# Patient Record
Sex: Female | Born: 1960 | Race: White | Hispanic: No | Marital: Single | Smoking: Never smoker
Health system: Southern US, Community
[De-identification: ages and names within clinical notes are randomized; demographics above are authoritative.]

## PROBLEM LIST (undated history)

## (undated) DIAGNOSIS — F329 Major depressive disorder, single episode, unspecified: Secondary | ICD-10-CM

## (undated) DIAGNOSIS — D689 Coagulation defect, unspecified: Secondary | ICD-10-CM

## (undated) DIAGNOSIS — H905 Unspecified sensorineural hearing loss: Secondary | ICD-10-CM

## (undated) DIAGNOSIS — R011 Cardiac murmur, unspecified: Secondary | ICD-10-CM

## (undated) DIAGNOSIS — M1990 Unspecified osteoarthritis, unspecified site: Secondary | ICD-10-CM

## (undated) DIAGNOSIS — K219 Gastro-esophageal reflux disease without esophagitis: Secondary | ICD-10-CM

## (undated) DIAGNOSIS — G4730 Sleep apnea, unspecified: Secondary | ICD-10-CM

## (undated) DIAGNOSIS — I341 Nonrheumatic mitral (valve) prolapse: Secondary | ICD-10-CM

## (undated) DIAGNOSIS — E611 Iron deficiency: Secondary | ICD-10-CM

## (undated) DIAGNOSIS — K259 Gastric ulcer, unspecified as acute or chronic, without hemorrhage or perforation: Secondary | ICD-10-CM

## (undated) DIAGNOSIS — N200 Calculus of kidney: Secondary | ICD-10-CM

## (undated) DIAGNOSIS — H269 Unspecified cataract: Secondary | ICD-10-CM

## (undated) DIAGNOSIS — F419 Anxiety disorder, unspecified: Secondary | ICD-10-CM

## (undated) DIAGNOSIS — F32A Depression, unspecified: Secondary | ICD-10-CM

## (undated) DIAGNOSIS — D649 Anemia, unspecified: Secondary | ICD-10-CM

## (undated) DIAGNOSIS — E785 Hyperlipidemia, unspecified: Secondary | ICD-10-CM

## (undated) DIAGNOSIS — T7840XA Allergy, unspecified, initial encounter: Secondary | ICD-10-CM

## (undated) DIAGNOSIS — I10 Essential (primary) hypertension: Secondary | ICD-10-CM

## (undated) HISTORY — DX: Major depressive disorder, single episode, unspecified: F32.9

## (undated) HISTORY — DX: Allergy, unspecified, initial encounter: T78.40XA

## (undated) HISTORY — DX: Essential (primary) hypertension: I10

## (undated) HISTORY — PX: POLYPECTOMY: SHX149

## (undated) HISTORY — DX: Sleep apnea, unspecified: G47.30

## (undated) HISTORY — DX: Calculus of kidney: N20.0

## (undated) HISTORY — DX: Cardiac murmur, unspecified: R01.1

## (undated) HISTORY — DX: Gastro-esophageal reflux disease without esophagitis: K21.9

## (undated) HISTORY — DX: Anxiety disorder, unspecified: F41.9

## (undated) HISTORY — DX: Unspecified cataract: H26.9

## (undated) HISTORY — DX: Gastric ulcer, unspecified as acute or chronic, without hemorrhage or perforation: K25.9

## (undated) HISTORY — PX: BREAST BIOPSY: SHX20

## (undated) HISTORY — DX: Depression, unspecified: F32.A

## (undated) HISTORY — DX: Nonrheumatic mitral (valve) prolapse: I34.1

## (undated) HISTORY — PX: CARDIAC CATHETERIZATION: SHX172

## (undated) HISTORY — DX: Iron deficiency: E61.1

## (undated) HISTORY — DX: Coagulation defect, unspecified: D68.9

## (undated) HISTORY — DX: Unspecified sensorineural hearing loss: H90.5

## (undated) HISTORY — PX: OVARIAN CYST REMOVAL: SHX89

## (undated) HISTORY — DX: Anemia, unspecified: D64.9

## (undated) HISTORY — DX: Hyperlipidemia, unspecified: E78.5

## (undated) HISTORY — PX: COLONOSCOPY: SHX174

## (undated) HISTORY — DX: Unspecified osteoarthritis, unspecified site: M19.90

---
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
## 1984-04-28 HISTORY — PX: APPENDECTOMY: SHX54

## 1998-02-21 ENCOUNTER — Other Ambulatory Visit: Admission: RE | Admit: 1998-02-21 | Discharge: 1998-02-21 | Payer: Self-pay | Admitting: *Deleted

## 1998-04-28 HISTORY — PX: ABDOMINAL HYSTERECTOMY: SHX81

## 2007-04-29 HISTORY — PX: UPPER GASTROINTESTINAL ENDOSCOPY: SHX188

## 2007-04-29 LAB — HM COLONOSCOPY: HM Colonoscopy: NORMAL

## 2009-04-28 HISTORY — PX: BREAST SURGERY: SHX581

## 2009-04-28 HISTORY — PX: CHOLECYSTECTOMY: SHX55

## 2010-12-17 ENCOUNTER — Encounter: Payer: Self-pay | Admitting: Family

## 2010-12-17 ENCOUNTER — Ambulatory Visit (INDEPENDENT_AMBULATORY_CARE_PROVIDER_SITE_OTHER): Payer: Medicare Other | Admitting: Family

## 2010-12-17 DIAGNOSIS — R197 Diarrhea, unspecified: Secondary | ICD-10-CM

## 2010-12-17 DIAGNOSIS — E119 Type 2 diabetes mellitus without complications: Secondary | ICD-10-CM

## 2010-12-17 DIAGNOSIS — K219 Gastro-esophageal reflux disease without esophagitis: Secondary | ICD-10-CM

## 2010-12-17 LAB — HEMOGLOBIN A1C: Hgb A1c MFr Bld: 6.1 % — ABNORMAL HIGH (ref <5.7)

## 2010-12-17 LAB — CBC WITH DIFFERENTIAL/PLATELET
Eosinophils Absolute: 0 10*3/uL (ref 0.0–0.7)
Eosinophils Relative: 1 % (ref 0–5)
Hemoglobin: 12.9 g/dL (ref 12.0–15.0)
Lymphocytes Relative: 46 % (ref 12–46)
Lymphs Abs: 3.1 10*3/uL (ref 0.7–4.0)
MCH: 28.5 pg (ref 26.0–34.0)
MCV: 86.1 fL (ref 78.0–100.0)
Monocytes Relative: 9 % (ref 3–12)
Neutrophils Relative %: 45 % (ref 43–77)
Platelets: 269 10*3/uL (ref 150–400)
RBC: 4.52 MIL/uL (ref 3.87–5.11)
WBC: 6.7 10*3/uL (ref 4.0–10.5)

## 2010-12-17 LAB — HEPATIC FUNCTION PANEL
Albumin: 4.5 g/dL (ref 3.5–5.2)
Bilirubin, Direct: 0.1 mg/dL (ref 0.0–0.3)
Total Bilirubin: 0.4 mg/dL (ref 0.3–1.2)

## 2010-12-17 LAB — LIPASE: Lipase: 24 U/L (ref 0–75)

## 2010-12-17 MED ORDER — FLUTICASONE PROPIONATE 50 MCG/ACT NA SUSP: 2.0000 | Freq: Every day | NASAL | Status: DC

## 2010-12-17 MED ORDER — OMEPRAZOLE MAGNESIUM 20 MG PO TBEC: 20.0000 mg | DELAYED_RELEASE_TABLET | Freq: Every day | ORAL | Status: DC

## 2010-12-17 MED ORDER — DIPHENOXYLATE-ATROPINE 2.5-0.025 MG PO TABS: 1.0000 | ORAL_TABLET | Freq: Four times a day (QID) | ORAL | Status: AC | PRN

## 2010-12-17 NOTE — Patient Instructions (Addendum)
Please go directly to the lab on the first floor today (take a left off of the elevator). Call if you develop worsening abdominal pain, nausea, vomitting or fever >101. Complete stool samples and return to the lab. Follow up in 1 month for a medicare wellness visit.

## 2010-12-17 NOTE — Progress Notes (Signed)
Subjective:    Patient ID: Molly May, female    DOB: 1961/01/29, 50 y.o.   MRN: [id number]  HPI Ms. Fellows is a 50 yr old hearing impaired female who presents today to establish care.  She is accompanied by a sign language interpreter.    Diabetes- She has been diabetic for 5 years  She reports that her fasting sugars are variable, generally low 100's, not over 200.  She does not check her sugar in the evening.  Last eye exam was February 2012 at [facility].    Depression-  She reports that she was treated with prozac from age 63 to 69.  She reports mild depression currently due to a lot of stress due to financial stress.  Her husband recently found a new job.  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 Mom had german measles during pregnancy.  She also reports heart murmur.   HTN- she is treated with lisinopril 5mg  only.  She denies history of HTN.  Hyperlipidemia- Denies myalgias on the pravastatin.    Reports that she is seeing an orthopedist for right shoulder pain and has had an injection.    Migraines-  Reports that this is stable.  Last migraine was 3 yrs ago.  Kidney stones- reports that she has had 3 episodes of problems with kidney stones-  Last episode was 5 yrs ago.    Ulcer-  This was at age 29.  None since.    Cough- Started about 1 month.  Cough is dry. Reports + post nasal drip. This is worse at night, has gaging, can't breath.  Throws up every night.    Diarrhea- off/on x 3 weeks.  On the days that she has diarrhea she has multiple loose stools.  She has been on Janumet since January.  She denies any antibiotic use in the last 1-2 months.  She has not had any travel.  GERD- reports that this is "bad."   Review of Systems  Constitutional: Negative for unexpected weight change.  HENT: Positive for hearing loss and postnasal drip.   Eyes:       Reports + cataract in the right eye  Respiratory: Positive for cough.   Cardiovascular: Negative for leg swelling.   Gastrointestinal: Positive for abdominal pain.       Abd pain due to reflux  Genitourinary:       S/p hysterectomy- oophorectomy  Musculoskeletal:       See HPI  Neurological: Negative for headaches.  Hematological:       + cervical LAD- uncomfortable  Psychiatric/Behavioral:       Mild depression- declines medication at this time.    Past Medical History  Diagnosis Date  . Depression   . Diabetes mellitus   . GERD (gastroesophageal reflux disease)   . Hypertension   . Heart murmur   . Hyperlipidemia   . Stomach ulcer 34yrs of age  . Kidney stone   . Migraine     History   Social History  . Marital Status: Single    Spouse Name: N/A    Number of Children: N/A  . Years of Education: N/A   Occupational History  . Not on file.   Social History Main Topics  . Smoking status: Never Smoker   . Smokeless tobacco: Never Used  . Alcohol Use: No  . Drug Use: No  . Sexually Active: Not on file   Other Topics Concern  . Not on file   Social History  Narrative   Regular exercise:  Yes. 3-5 x weeklyCaffeine Use:  NoMarried- lives with husband, 2 children (age 65 and 26).Disabled- due to deafness and overall work. Couldn't keep up.  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    Past Surgical History  Procedure Date  . Cholecystectomy 2011  . Breast surgery 2011    biposy--benign  . Appendectomy 1986  . Tonsillectomy and adenoidectomy 1973  . Abdominal hysterectomy 2000  . Ovarian cyst removal 1986, 1988  . Upper gastrointestinal endoscopy 2009    Gets them every 3 years    Family History  Problem Relation Age of Onset  . Kidney disease Mother   . Multiple sclerosis Mother   . Other Mother     polio  . Cancer Father     colon  . Hyperlipidemia Father   . Heart disease Father   . Hypertension Father   . Diabetes Maternal Aunt   . Heart disease Maternal Grandmother   . Hypertension Maternal  Grandmother   . Diabetes Maternal Grandmother   . Heart disease Maternal Grandfather   . Hypertension Maternal Grandfather     Allergies  Allergen Reactions  . Aspirin Other (See Comments)    Stomach pains  . Codeine Other (See Comments)    Nosebleed  . Erythromycin Nausea And Vomiting  . Penicillins Hives  . Vaseline (Petrolatum) Itching    No current outpatient prescriptions on file prior to visit.    BP 140/78  Pulse 84  Temp(Src) 97.5 F (36.4 C) (Oral)  Resp 16  Ht 5' 2.5" (1.588 m)  Wt 106 lb (48.081 kg)  BMI 19.08 kg/m2  LMP 04/28/1998        Objective:   Physical Exam  Constitutional: She is oriented to person, place, and time. She appears well-developed and well-nourished.  HENT:  Mouth/Throat: Uvula is midline, oropharynx is clear and moist and mucous membranes are normal.       Bilateral tm's occluded by cerumen  Neck: Normal range of motion. Neck supple. No thyromegaly present.  Cardiovascular: Normal rate and regular rhythm.   No murmur heard. Pulmonary/Chest: Effort normal and breath sounds normal. No respiratory distress. She has no wheezes. She has no rales. She exhibits no tenderness.  Abdominal: Soft. Bowel sounds are normal. She exhibits no distension and no mass. There is no tenderness. There is no rebound.  Musculoskeletal: Normal range of motion. She exhibits no edema.  Neurological: She is alert and oriented to person, place, and time.  Skin: Skin is warm and dry.  Psychiatric: She has a normal mood and affect. Her behavior is normal. Judgment and thought content normal.          Assessment & Plan:

## 2010-12-18 DIAGNOSIS — R197 Diarrhea, unspecified: Secondary | ICD-10-CM | POA: Insufficient documentation

## 2010-12-18 DIAGNOSIS — K219 Gastro-esophageal reflux disease without esophagitis: Secondary | ICD-10-CM | POA: Insufficient documentation

## 2010-12-18 DIAGNOSIS — E119 Type 2 diabetes mellitus without complications: Secondary | ICD-10-CM | POA: Insufficient documentation

## 2010-12-18 LAB — MICROALBUMIN / CREATININE URINE RATIO
Creatinine, Urine: 205.9 mg/dL
Microalb Creat Ratio: 3.4 mg/g (ref 0.0–30.0)

## 2010-12-18 LAB — BASIC METABOLIC PANEL WITHOUT GFR
BUN: 14 mg/dL (ref 6–23)
CO2: 30 meq/L (ref 19–32)
Calcium: 9.8 mg/dL (ref 8.4–10.5)
Chloride: 101 meq/L (ref 96–112)
Creat: 0.6 mg/dL (ref 0.50–1.10)
GFR, Est African American: >60 mL/min
GFR, Est Non African American: >60 mL/min
Glucose, Bld: 133 mg/dL — ABNORMAL HIGH (ref 70–99)
Potassium: 4.1 meq/L (ref 3.5–5.3)
Sodium: 138 meq/L (ref 135–145)

## 2010-12-18 NOTE — Assessment & Plan Note (Signed)
Deteriorated.  Recommended that she start Prilosec OTC.  This may be contributing to her c/o cough as well.

## 2010-12-18 NOTE — Assessment & Plan Note (Addendum)
Clinically seems well controlled, will check A1C, urine microalbumin, bmet.

## 2010-12-18 NOTE — Assessment & Plan Note (Signed)
Present x 3 weeks, afebrile.  Benign abd exam. Check stool studies as below.

## 2010-12-19 LAB — OVA AND PARASITE SCREEN: OP: NONE SEEN

## 2010-12-22 LAB — STOOL CULTURE

## 2010-12-23 ENCOUNTER — Encounter: Payer: Self-pay | Admitting: Family

## 2011-01-03 ENCOUNTER — Telehealth: Payer: Self-pay | Admitting: Family

## 2011-01-03 MED ORDER — ESTRADIOL 1 MG PO TABS: 1.0000 mg | ORAL_TABLET | Freq: Every day | ORAL | Status: DC

## 2011-01-03 MED ORDER — PRAVASTATIN SODIUM 20 MG PO TABS: 20.0000 mg | ORAL_TABLET | Freq: Every day | ORAL | Status: DC

## 2011-01-03 NOTE — Telephone Encounter (Signed)
OK to give 1 mont supply estradiol with 2 refills.

## 2011-01-03 NOTE — Telephone Encounter (Signed)
Pt has f/u 01/17/11. Refill sent for pravastatin 20mg  1 daily #30 x no refills. Please advise if ok to refill Estradiol and number of refills?

## 2011-01-03 NOTE — Telephone Encounter (Signed)
Pt called  Needs 2 meds sent to [facility] on [address]   HP  , pt states she did not get Estradiol 1mg  and Pravastatin  20mg  when she was seen as New Patient

## 2011-01-03 NOTE — Telephone Encounter (Signed)
Refills sent to pharmacy as stated below. Pt notified.

## 2011-01-17 ENCOUNTER — Ambulatory Visit (INDEPENDENT_AMBULATORY_CARE_PROVIDER_SITE_OTHER): Payer: Medicare Other | Admitting: Family

## 2011-01-17 ENCOUNTER — Encounter: Payer: Self-pay | Admitting: Family

## 2011-01-17 VITALS — BP 100/80 | HR 66 | Temp 97.9°F | Resp 16 | Ht 62.5 in | Wt 105.1 lb

## 2011-01-17 DIAGNOSIS — Z0000 Encounter for general adult medical examination without abnormal findings: Secondary | ICD-10-CM | POA: Insufficient documentation

## 2011-01-17 DIAGNOSIS — E785 Hyperlipidemia, unspecified: Secondary | ICD-10-CM

## 2011-01-17 DIAGNOSIS — R202 Paresthesia of skin: Secondary | ICD-10-CM

## 2011-01-17 DIAGNOSIS — R209 Unspecified disturbances of skin sensation: Secondary | ICD-10-CM

## 2011-01-17 DIAGNOSIS — K219 Gastro-esophageal reflux disease without esophagitis: Secondary | ICD-10-CM

## 2011-01-17 DIAGNOSIS — E1142 Type 2 diabetes mellitus with diabetic polyneuropathy: Secondary | ICD-10-CM | POA: Insufficient documentation

## 2011-01-17 MED ORDER — RANITIDINE HCL 150 MG PO CAPS: 150.0000 mg | ORAL_CAPSULE | Freq: Two times a day (BID) | ORAL | Status: DC

## 2011-01-17 MED ORDER — LISINOPRIL 5 MG PO TABS: 5.0000 mg | ORAL_TABLET | Freq: Every day | ORAL | Status: DC

## 2011-01-17 MED ORDER — SITAGLIPTIN PHOS-METFORMIN HCL 50-1000 MG PO TABS: 1.0000 | ORAL_TABLET | Freq: Two times a day (BID) | ORAL | Status: DC

## 2011-01-17 NOTE — Assessment & Plan Note (Signed)
Will refer to GYN at pt's request.

## 2011-01-17 NOTE — Progress Notes (Signed)
Subjective:    Patient ID: Molly May, female    DOB: [birth year], 50 y.o.   MRN: [id number]  HPI  Preventative- She is requesting referral to GYN for pap smear. She reports that she walks regularly.  Diet is healthy.  She has started diabetic education.  Last mammogram January 2012. Performed at [facility]. Last tetanus shot was 2004.  Declines flu shot- she reports that she had a bad reaction.   Subjective:   Patient here for Medicare annual wellness visit and management of other chronic and acute problems.  GERD-  She reports that prilosec gave her migraines.   She wants referral to GI- Dad had colon cancer which was diagnosed in his 31's.  She notes that her reflux bothers her the most when she goes to bed.    Feet burning- started recently. Intermittent in nature, mild.   DM2-A1C last visit was stable. She continues Janumet.     Risk factors:  Increased risk for CAD given history of DM2.  Roster of Physicians Providing Medical Care to Patient: None   Activities of Daily Living  In your present state of health, do you have any difficulty performing the following activities? Preparing food and eating?: No  Bathing yourself: No  Getting dressed: No  Using the toilet:No  Moving around from place to place: No  In the past year have you fallen or had a near fall?:No    Home Safety: Has smoke detector and wears seat belts. No firearms. No excess sun exposure.  Diet and Exercise  Current exercise habits: walking Dietary issues discussed: healthy diet   Depression Screen  (Note: if answer to either of the following is "Yes", then a more complete depression screening is indicated)  Q1: Over the past two weeks, have you felt down, depressed or hopeless?no  Q2: Over the past two weeks, have you felt little interest or pleasure in doing things? no   The following portions of the patient's history were reviewed and updated as appropriate: allergies, current medications,  past family history, past medical history, past social history, past surgical history and problem list.    Objective:   Vision:  Not tested, grossly intact Hearing: deaf Body mass index: see nursing.  Cognitive Impairment Assessment: cognition, memory and judgment appear normal.   Assessment:   Medicare wellness utd on preventive parameters- she is requesting referral to GYN for Pap.    Encourage patient to continue exercise and healthy diet.  Plan:    During the course of the visit the patient was educated and counseled about appropriate screening and preventive services including:        Nutrition counseling- she will continue her diabetic counseling.   Vaccines / LABS Declines flu shot, up to date on tetanus.  Patient Instructions (the written plan) was given to the patient.       Review of Systems  Constitutional: Negative for unexpected weight change.  HENT: Positive for hearing loss.   Eyes:       Reports cataract in the right eye,  Left eye she has  A "dot"  She is following with eye doctor who recommends surgery at age 57.  Respiratory: Negative for shortness of breath.   Cardiovascular: Negative for chest pain.  Gastrointestinal: Negative for diarrhea.       Notes occasional nausea.  Genitourinary: Negative for dysuria.  Musculoskeletal: Negative for myalgias and arthralgias.  Skin: Negative for rash.       denies concerning skin lesions.  Neurological:       Headaches while on prilosec, now resolved  Hematological: Negative for adenopathy.  Psychiatric/Behavioral:       Denies depression or anxiety   Past Medical History  Diagnosis Date  . Depression   . Diabetes mellitus   . GERD (gastroesophageal reflux disease)   . Hypertension   . Heart murmur   . Hyperlipidemia   . Stomach ulcer 89yrs of age  . Kidney stone   . Migraine     History   Social History  . Marital Status: Single    Spouse Name: N/A    Number of Children: N/A  . Years of  Education: N/A   Occupational History  . Not on file.   Social History Main Topics  . Smoking status: Never Smoker   . Smokeless tobacco: Never Used  . Alcohol Use: No  . Drug Use: No  . Sexually Active: Not on file   Other Topics Concern  . Not on file   Social History Narrative   Regular exercise:  Yes. 3-5 x weeklyCaffeine Use:  NoMarried- lives with husband, 2 children (age 69 and 74).Disabled- due to deafness and overall work. Couldn't keep up.  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    Past Surgical History  Procedure Date  . Cholecystectomy 2011  . Breast surgery 2011    biposy--benign  . Appendectomy 1986  . Tonsillectomy and adenoidectomy 1973  . Abdominal hysterectomy 2000  . Ovarian cyst removal 1986, 1988  . Upper gastrointestinal endoscopy 2009    Gets them every 3 years    Family History  Problem Relation Age of Onset  . Kidney disease Mother   . Multiple sclerosis Mother   . Other Mother     polio  . Cancer Father     colon  . Hyperlipidemia Father   . Heart disease Father   . Hypertension Father   . Diabetes Maternal Aunt   . Heart disease Maternal Grandmother   . Hypertension Maternal Grandmother   . Diabetes Maternal Grandmother   . Heart disease Maternal Grandfather   . Hypertension Maternal Grandfather     Allergies  Allergen Reactions  . Aspirin Other (See Comments)    Stomach pains  . Codeine Other (See Comments)    Nosebleed  . Erythromycin Nausea And Vomiting  . Penicillins Hives  . Prilosec (Omeprazole Magnesium)     migraine  . Vaseline (Petrolatum) Itching  . Vicodin (Hydrocodone-Acetaminophen)     twitching    Current Outpatient Prescriptions on File Prior to Visit  Medication Sig Dispense Refill  . estradiol (ESTRACE) 1 MG tablet Take 1 tablet (1 mg total) by mouth daily.  30 tablet  2  . fluticasone (FLONASE) 50 MCG/ACT nasal spray Place 2 sprays  into the nose daily.  16 g  2  . glucose blood (ONE TOUCH ULTRA TEST) test strip Use to check blood sugar every morning.       . pravastatin (PRAVACHOL) 20 MG tablet Take 1 tablet (20 mg total) by mouth at bedtime.  30 tablet  1    BP 100/80  Pulse 66  Temp(Src) 97.9 F (36.6 C) (Oral)  Resp 16  Ht 5' 2.5" (1.588 m)  Wt 105 lb 1.9 oz (47.682 kg)  BMI 18.92 kg/m2  LMP 04/28/1998       Objective:   Physical Exam  Constitutional: She is oriented to person, place,  and time. She appears well-developed and well-nourished. No distress.  HENT:  Head: Normocephalic and atraumatic.  Mouth/Throat: No oropharyngeal exudate.       Bilateral cerumen impaction. Declines removal.   Eyes: Conjunctivae are normal. Pupils are equal, round, and reactive to light.  Cardiovascular: Normal rate and regular rhythm.   No murmur heard. Pulmonary/Chest: Effort normal and breath sounds normal. No respiratory distress. She has no wheezes. She has no rales. She exhibits no tenderness.  Abdominal: Soft. Bowel sounds are normal.  Genitourinary:       Deferred to GYN  Musculoskeletal: She exhibits no edema.  Neurological: She is alert and oriented to person, place, and time.  Skin: Skin is warm and dry.  Psychiatric: She has a normal mood and affect. Her behavior is normal. Judgment and thought content normal.          Assessment & Plan:

## 2011-01-17 NOTE — Assessment & Plan Note (Signed)
Suspect early neuropathy.  We discussed trying medication, but she declines at this time, will let me know if symptoms worsen.

## 2011-01-17 NOTE — Assessment & Plan Note (Signed)
Did not tolerate prilosec due to headache.  Will switch to zantac and refer to GI for further evaluation.

## 2011-01-17 NOTE — Patient Instructions (Signed)
Please return fasting to the lab on the first floor to have your cholesterol drawn. Follow up in 3 months.   You will be contacted about your referral to GYN and Gastroenterology.

## 2011-01-23 ENCOUNTER — Telehealth: Payer: Self-pay | Admitting: *Deleted

## 2011-01-23 NOTE — Telephone Encounter (Signed)
Received records from [facility] in [place] and forwarded to Provider for review.

## 2011-02-08 LAB — LIPID PANEL
Cholesterol: 136 mg/dL (ref 0–200)
Triglycerides: 80 mg/dL (ref <150)

## 2011-02-09 ENCOUNTER — Encounter: Payer: Self-pay | Admitting: Family

## 2011-02-19 ENCOUNTER — Encounter: Payer: Self-pay | Admitting: Family

## 2011-02-19 DIAGNOSIS — G43909 Migraine, unspecified, not intractable, without status migrainosus: Secondary | ICD-10-CM | POA: Insufficient documentation

## 2011-02-19 DIAGNOSIS — K589 Irritable bowel syndrome without diarrhea: Secondary | ICD-10-CM | POA: Insufficient documentation

## 2011-02-19 DIAGNOSIS — G4730 Sleep apnea, unspecified: Secondary | ICD-10-CM | POA: Insufficient documentation

## 2011-02-19 DIAGNOSIS — I341 Nonrheumatic mitral (valve) prolapse: Secondary | ICD-10-CM

## 2011-02-19 DIAGNOSIS — H905 Unspecified sensorineural hearing loss: Secondary | ICD-10-CM

## 2011-02-19 DIAGNOSIS — F419 Anxiety disorder, unspecified: Secondary | ICD-10-CM

## 2011-02-19 HISTORY — DX: Nonrheumatic mitral (valve) prolapse: I34.1

## 2011-02-19 HISTORY — DX: Unspecified sensorineural hearing loss: H90.5

## 2011-02-19 HISTORY — DX: Anxiety disorder, unspecified: F41.9

## 2011-03-06 ENCOUNTER — Other Ambulatory Visit: Payer: Self-pay | Admitting: Family

## 2011-04-16 ENCOUNTER — Ambulatory Visit: Payer: Medicare Other | Admitting: Family

## 2011-05-02 ENCOUNTER — Encounter: Payer: Self-pay | Admitting: Family

## 2011-05-02 ENCOUNTER — Ambulatory Visit (INDEPENDENT_AMBULATORY_CARE_PROVIDER_SITE_OTHER): Payer: Medicare Other | Admitting: Family

## 2011-05-02 VITALS — BP 140/78 | HR 96 | Temp 97.4°F | Resp 18 | Ht 60.25 in | Wt 103.0 lb

## 2011-05-02 DIAGNOSIS — E119 Type 2 diabetes mellitus without complications: Secondary | ICD-10-CM

## 2011-05-02 DIAGNOSIS — K219 Gastro-esophageal reflux disease without esophagitis: Secondary | ICD-10-CM

## 2011-05-02 DIAGNOSIS — E785 Hyperlipidemia, unspecified: Secondary | ICD-10-CM | POA: Insufficient documentation

## 2011-05-02 LAB — HEMOGLOBIN A1C: Hgb A1c MFr Bld: 5.8 % — ABNORMAL HIGH (ref <5.7)

## 2011-05-02 NOTE — Patient Instructions (Signed)
Please complete your blood work prior to leaving.  Follow up in 3 months.  Happy New Year!

## 2011-05-02 NOTE — Assessment & Plan Note (Signed)
Lipids at goal in October.  Will plan to continue statin at current dose.

## 2011-05-02 NOTE — Progress Notes (Signed)
Subjective:    Patient ID: Molly May, female    DOB: 1961-03-17, 51 y.o.   MRN: [id number]  HPI  Ms.  May is a 51 yr old female who presents today for follow up.  1) Hyperlipidemia- She is maintained on pravastatin. Denies myalgias.  2) GERD- was diagnosed with H. Pylori. She completed metronidazole/clarithromycin yesterday. She is maintained on protonix. Relux symptom are improved, coughing is resolved.  She is followed at [facility].   3) Paresthesia of foot-  She reports that this is resolved. She reports that her son dropped an ornament on the floor and it struck her foot, but no residual pain.   4) DM2- She continues Janumet. She reports that her sugar has been in the 200's.      Review of Systems    see HPI  Past Medical History  Diagnosis Date  . Depression   . Diabetes mellitus   . GERD (gastroesophageal reflux disease)   . Hypertension   . Heart murmur   . Hyperlipidemia   . Stomach ulcer 89yrs of age  . Kidney stone   . Migraine   . 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 Anxiety disorder 02/19/2011  . Mitral valve prolapse 02/19/2011  . Sleep apnea 02/19/2011    History   Social History  . Marital Status: Single    Spouse Name: N/A    Number of Children: N/A  . Years of Education: N/A   Occupational History  . Not on file.   Social History Main Topics  . Smoking status: Never Smoker   . Smokeless tobacco: Never Used  . Alcohol Use: No  . Drug Use: No  . Sexually Active: Not on file   Other Topics Concern  . Not on file   Social History Narrative   Regular exercise:  Yes. 3-5 x weeklyCaffeine Use:  NoMarried- lives with husband, 2 children (age 63 and 21).Disabled- due to deafness and overall work. Couldn't keep up.  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    Past Surgical History  Procedure Date  . Cholecystectomy 2011  . Breast surgery 2011   biposy--benign  . Appendectomy 1986  . Tonsillectomy and adenoidectomy 1973  . Abdominal hysterectomy 2000  . Ovarian cyst removal 1986, 1988  . Upper gastrointestinal endoscopy 2009    Gets them every 3 years    Family History  Problem Relation Age of Onset  . Kidney disease Mother   . Multiple sclerosis Mother   . Other Mother     polio  . Cancer Father     colon  . Hyperlipidemia Father   . Heart disease Father   . Hypertension Father   . Diabetes Maternal Aunt   . Heart disease Maternal Grandmother   . Hypertension Maternal Grandmother   . Diabetes Maternal Grandmother   . Heart disease Maternal Grandfather   . Hypertension Maternal Grandfather     Allergies  Allergen Reactions  . Aspirin Other (See Comments)    Stomach pains  . Codeine Other (See Comments)    Nosebleed  . Erythromycin Nausea And Vomiting  . Penicillins Hives  . Prilosec (Omeprazole Magnesium)     migraine  . Vaseline (Petrolatum) Itching  . Vicodin (Hydrocodone-Acetaminophen)     twitching    Current Outpatient Prescriptions on File Prior to Visit  Medication Sig Dispense Refill  . Calcium Carbonate-Vitamin D (CALTRATE 600+D) 600-400 MG-UNIT per tablet Take 1  tablet by mouth daily.        Marland Kitchen estradiol (ESTRACE) 1 MG tablet Take 1 tablet (1 mg total) by mouth daily.  30 tablet  2  . fluticasone (FLONASE) 50 MCG/ACT nasal spray Place 2 sprays into the nose daily.  16 g  2  . glucose blood (ONE TOUCH ULTRA TEST) test strip Use to check blood sugar every morning.       Marland Kitchen lisinopril (PRINIVIL,ZESTRIL) 5 MG tablet Take 1 tablet (5 mg total) by mouth daily. For kidney protection.  30 tablet  3  . pravastatin (PRAVACHOL) 20 MG tablet TAKE ONE TABLET BY MOUTH AT BEDTIME  30 tablet  1  . sitaGLIPtan-metformin (JANUMET) 50-1000 MG per tablet Take 1 tablet by mouth 2 (two) times daily with a meal.  60 tablet  3    BP 140/78  Pulse 96  Temp(Src) 97.4 F (36.3 C) (Oral)  Resp 18  Ht 5' 0.25" (1.53 m)   Wt 103 lb (46.72 kg)  BMI 19.95 kg/m2  LMP 04/28/1998    Objective:   Physical Exam  Constitutional: She is oriented to person, place, and time. She appears well-developed and well-nourished. No distress.  HENT:  Head: Normocephalic and atraumatic.  Neck: Normal range of motion. Neck supple.  Cardiovascular: Normal rate and regular rhythm.   No murmur heard. Pulmonary/Chest: Effort normal and breath sounds normal. No respiratory distress. She has no wheezes. She has no rales. She exhibits no tenderness.  Musculoskeletal: She exhibits no edema.  Lymphadenopathy:    She has no cervical adenopathy.  Neurological: She is alert and oriented to person, place, and time.  Skin: Skin is warm and dry.  Psychiatric: She has a normal mood and affect. Her behavior is normal. Judgment and thought content normal.          Assessment & Plan:

## 2011-05-02 NOTE — Assessment & Plan Note (Addendum)
Obtain A1C. Per report, sugar has been running high. A1C was 6.1 previously.  Continue janumet.

## 2011-05-02 NOTE — Assessment & Plan Note (Signed)
S/p H. Pylori treatment. Clinically improved on protonix, continue same.

## 2011-05-04 ENCOUNTER — Encounter: Payer: Self-pay | Admitting: Family

## 2011-05-05 ENCOUNTER — Other Ambulatory Visit: Payer: Self-pay | Admitting: Family

## 2011-05-21 ENCOUNTER — Other Ambulatory Visit: Payer: Self-pay | Admitting: Family

## 2011-06-02 ENCOUNTER — Encounter: Payer: Self-pay | Admitting: Family

## 2011-06-02 ENCOUNTER — Ambulatory Visit (INDEPENDENT_AMBULATORY_CARE_PROVIDER_SITE_OTHER): Payer: Medicare Other | Admitting: Family

## 2011-06-02 ENCOUNTER — Ambulatory Visit: Payer: Medicare Other | Admitting: Family

## 2011-06-02 VITALS — BP 110/80 | HR 121 | Temp 97.6°F | Resp 16

## 2011-06-02 DIAGNOSIS — J020 Streptococcal pharyngitis: Secondary | ICD-10-CM | POA: Insufficient documentation

## 2011-06-02 MED ORDER — BENZONATATE 100 MG PO CAPS: 100.0000 mg | ORAL_CAPSULE | Freq: Three times a day (TID) | ORAL | Status: AC | PRN

## 2011-06-02 MED ORDER — CEFUROXIME AXETIL 500 MG PO TABS: 500.0000 mg | ORAL_TABLET | Freq: Two times a day (BID) | ORAL | Status: AC

## 2011-06-02 NOTE — Progress Notes (Signed)
Subjective:    Patient ID: Molly May, female    DOB: 1961-03-02, 51 y.o.   MRN: [id number]  HPI Ms.  May is a 51 yr old female who presents today with chief complaint of sore throat.  Symptoms started thursday night. Has had hot/cold chills.  +cough, production yellow/green.     Review of Systems See HPI  Past Medical History  Diagnosis Date  . Depression   . Diabetes mellitus   . GERD (gastroesophageal reflux disease)   . Hypertension   . Heart murmur   . Hyperlipidemia   . Stomach ulcer 62yrs of age  . Kidney stone   . Migraine   . 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 Anxiety disorder 02/19/2011  . Mitral valve prolapse 02/19/2011  . Sleep apnea 02/19/2011    History   Social History  . Marital Status: Single    Spouse Name: N/A    Number of Children: N/A  . Years of Education: N/A   Occupational History  . Not on file.   Social History Main Topics  . Smoking status: Never Smoker   . Smokeless tobacco: Never Used  . Alcohol Use: No  . Drug Use: No  . Sexually Active: Not on file   Other Topics Concern  . Not on file   Social History Narrative   Regular exercise:  Yes. 3-5 x weeklyCaffeine Use:  NoMarried- lives with husband, 2 children (age 42 and 71).Disabled- due to deafness and overall work. Couldn't keep up.  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    Past Surgical History  Procedure Date  . Cholecystectomy 2011  . Breast surgery 2011    biposy--benign  . Appendectomy 1986  . Tonsillectomy and adenoidectomy 1973  . Abdominal hysterectomy 2000  . Ovarian cyst removal 1986, 1988  . Upper gastrointestinal endoscopy 2009    Gets them every 3 years    Family History  Problem Relation Age of Onset  . Kidney disease Mother   . Multiple sclerosis Mother   . Other Mother     polio  . Cancer Father     colon  . Hyperlipidemia Father   . Heart disease Father   .  Hypertension Father   . Diabetes Maternal Aunt   . Heart disease Maternal Grandmother   . Hypertension Maternal Grandmother   . Diabetes Maternal Grandmother   . Heart disease Maternal Grandfather   . Hypertension Maternal Grandfather     Allergies  Allergen Reactions  . Aspirin Other (See Comments)    Stomach pains  . Codeine Other (See Comments)    Nosebleed  . Erythromycin Nausea And Vomiting  . Fluticasone Propionate (Nasal)     Stomach upset  . Penicillins Hives  . Prilosec (Omeprazole Magnesium)     migraine  . Vaseline (Petrolatum) Itching  . Vicodin (Hydrocodone-Acetaminophen)     twitching    Current Outpatient Prescriptions on File Prior to Visit  Medication Sig Dispense Refill  . Calcium Carbonate-Vitamin D (CALTRATE 600+D) 600-400 MG-UNIT per tablet Take 1 tablet by mouth daily.        Marland Kitchen estradiol (ESTRACE) 1 MG tablet Take 1 tablet (1 mg total) by mouth daily.  30 tablet  2  . glucose blood (ONE TOUCH ULTRA TEST) test strip Use to check blood sugar every morning.       Marland Kitchen JANUMET 50-1000 MG per tablet TAKE ONE TABLET BY MOUTH TWICE DAILY  WITH MEALS  60 each  3  . lisinopril (PRINIVIL,ZESTRIL) 5 MG tablet Take 1 tablet (5 mg total) by mouth daily. For kidney protection.  30 tablet  3  . pantoprazole (PROTONIX) 40 MG tablet Take 40 mg by mouth daily.        . pravastatin (PRAVACHOL) 20 MG tablet TAKE ONE TABLET BY MOUTH AT BEDTIME  30 tablet  3    BP 110/80  Pulse 121  Temp(Src) 97.6 F (36.4 C) (Oral)  Resp 16  SpO2 99%  LMP 04/28/1998       Objective:   Physical Exam  Constitutional: She appears well-developed and well-nourished. No distress.  HENT:  Head: Normocephalic and atraumatic.  Mouth/Throat: Posterior oropharyngeal erythema present. No oropharyngeal exudate, posterior oropharyngeal edema or tonsillar abscesses.       Bilateral TM's are occluded by cerumen.   Cardiovascular: Normal rate and regular rhythm.   No murmur  heard. Pulmonary/Chest: Effort normal and breath sounds normal. No respiratory distress. She has no wheezes. She has no rales. She exhibits no tenderness.  Musculoskeletal: She exhibits no edema.  Psychiatric: She has a normal mood and affect. Her behavior is normal. Thought content normal.          Assessment & Plan:

## 2011-06-02 NOTE — Progress Notes (Signed)
Addended by: Mervin Kung A on: 06/02/2011 06:24 PM   Modules accepted: Orders

## 2011-06-02 NOTE — Assessment & Plan Note (Addendum)
Rapid strep in office is positive. She tells me that she has tolerated keflex in the past. Will plan to treat with ceftin.  Add tessalon PRN cough.

## 2011-06-02 NOTE — Patient Instructions (Signed)
Please call if your symptoms worsen or if you are not feeling better in 2-3 days.  

## 2011-08-01 ENCOUNTER — Ambulatory Visit: Payer: Medicare Other | Admitting: Family

## 2011-08-04 ENCOUNTER — Ambulatory Visit (INDEPENDENT_AMBULATORY_CARE_PROVIDER_SITE_OTHER): Payer: Medicare Other | Admitting: Family

## 2011-08-04 ENCOUNTER — Encounter: Payer: Self-pay | Admitting: Family

## 2011-08-04 VITALS — BP 130/76 | HR 103 | Temp 97.8°F | Resp 16 | Ht 60.25 in | Wt 102.0 lb

## 2011-08-04 DIAGNOSIS — E119 Type 2 diabetes mellitus without complications: Secondary | ICD-10-CM

## 2011-08-04 DIAGNOSIS — G43909 Migraine, unspecified, not intractable, without status migrainosus: Secondary | ICD-10-CM

## 2011-08-04 DIAGNOSIS — R109 Unspecified abdominal pain: Secondary | ICD-10-CM | POA: Insufficient documentation

## 2011-08-04 DIAGNOSIS — K219 Gastro-esophageal reflux disease without esophagitis: Secondary | ICD-10-CM

## 2011-08-04 DIAGNOSIS — E785 Hyperlipidemia, unspecified: Secondary | ICD-10-CM

## 2011-08-04 LAB — HEPATIC FUNCTION PANEL
ALT: 8 U/L (ref 0–35)
Albumin: 4.3 g/dL (ref 3.5–5.2)
Bilirubin, Direct: 0.1 mg/dL (ref 0.0–0.3)
Total Protein: 7.6 g/dL (ref 6.0–8.3)

## 2011-08-04 LAB — HEMOGLOBIN A1C
Hgb A1c MFr Bld: 6.3 % — ABNORMAL HIGH (ref <5.7)
Mean Plasma Glucose: 134 mg/dL — ABNORMAL HIGH (ref <117)

## 2011-08-04 NOTE — Assessment & Plan Note (Signed)
Continue PPI. Stable.

## 2011-08-04 NOTE — Assessment & Plan Note (Signed)
Clinically stable, tolerating PO's.  Defer management to GI.

## 2011-08-04 NOTE — Assessment & Plan Note (Signed)
Stable. No recent headaches.  Monitor.

## 2011-08-04 NOTE — Assessment & Plan Note (Signed)
On pravachol. LDL was at goal back in September.  Continue same.

## 2011-08-04 NOTE — Assessment & Plan Note (Addendum)
Stable on Janumet. She is intolerant to ASA and therefore is not on aspirin.  She will complete eye exam this week.  Pneumovax is up to date. On ACE.

## 2011-08-04 NOTE — Progress Notes (Signed)
Subjective:    Patient ID: Molly May, female    DOB: 03/31/1961, 51 y.o.   MRN: [id number]  HPI  Molly May is a 51 yr old female who presents today for follow up.  She is accompanied by a sign language interpreter.  Abdominal pain- Saw [facility].  Still having some right upper quad pain and stiffness.  Had CT last week.  She tells me that GI thinks that she may have a retained stone (she is s/p cholecystectomy). Awaiting results from CT at [facility] last week.  DM2-  She is checking sugars- low 90, high was 125.  She has eye exam scheduled this Friday.  Migraines- none.    GERD- reports that she is not currently having nay reflux symptoms. She continues the protonix.    Review of Systems    see HPI  Past Medical History  Diagnosis Date  . Depression   . Diabetes mellitus   . GERD (gastroesophageal reflux disease)   . Hypertension   . Heart murmur   . Hyperlipidemia   . Stomach ulcer 32yrs of age  . Kidney stone   . Migraine   . 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 Anxiety disorder 02/19/2011  . Mitral valve prolapse 02/19/2011  . Sleep apnea 02/19/2011    History   Social History  . Marital Status: Single    Spouse Name: N/A    Number of Children: N/A  . Years of Education: N/A   Occupational History  . Not on file.   Social History Main Topics  . Smoking status: Never Smoker   . Smokeless tobacco: Never Used  . Alcohol Use: No  . Drug Use: No  . Sexually Active: Not on file   Other Topics Concern  . Not on file   Social History Narrative   Regular exercise:  Yes. 3-5 x weeklyCaffeine Use:  NoMarried- lives with husband, 2 children (age 52 and 31).Disabled- due to deafness and overall work. Couldn't keep up.  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    Past Surgical History  Procedure Date  . Cholecystectomy 2011  . Breast surgery 2011    biposy--benign  . Appendectomy  1986  . Tonsillectomy and adenoidectomy 1973  . Abdominal hysterectomy 2000  . Ovarian cyst removal 1986, 1988  . Upper gastrointestinal endoscopy 2009    Gets them every 3 years    Family History  Problem Relation Age of Onset  . Kidney disease Mother   . Multiple sclerosis Mother   . Other Mother     polio  . Cancer Father     colon  . Hyperlipidemia Father   . Heart disease Father   . Hypertension Father   . Diabetes Maternal Aunt   . Heart disease Maternal Grandmother   . Hypertension Maternal Grandmother   . Diabetes Maternal Grandmother   . Heart disease Maternal Grandfather   . Hypertension Maternal Grandfather     Allergies  Allergen Reactions  . Aspirin Other (See Comments)    Stomach pains  . Codeine Other (See Comments)    Nosebleed  . Erythromycin Nausea And Vomiting  . Fluticasone Propionate (Nasal)     Stomach upset  . Penicillins Hives  . Prilosec (Omeprazole Magnesium)     migraine  . Vaseline (Petrolatum) Itching  . Vicodin (Hydrocodone-Acetaminophen)     twitching    Current Outpatient Prescriptions on File Prior to Visit  Medication  Sig Dispense Refill  . Calcium Carbonate-Vitamin D (CALTRATE 600+D) 600-400 MG-UNIT per tablet Take 1 tablet by mouth daily.        Marland Kitchen estradiol (ESTRACE) 1 MG tablet Take 1 tablet (1 mg total) by mouth daily.  30 tablet  2  . glucose blood (ONE TOUCH ULTRA TEST) test strip Use to check blood sugar every morning.       Marland Kitchen JANUMET 50-1000 MG per tablet TAKE ONE TABLET BY MOUTH TWICE DAILY WITH MEALS  60 each  3  . lisinopril (PRINIVIL,ZESTRIL) 5 MG tablet Take 1 tablet (5 mg total) by mouth daily. For kidney protection.  30 tablet  3  . pantoprazole (PROTONIX) 40 MG tablet Take 40 mg by mouth daily.        . pravastatin (PRAVACHOL) 20 MG tablet TAKE ONE TABLET BY MOUTH AT BEDTIME  30 tablet  3    BP 130/76  Pulse 103  Temp(Src) 97.8 F (36.6 C) (Oral)  Resp 16  Ht 5' 0.25" (1.53 m)  Wt 102 lb (46.267 kg)  BMI  19.76 kg/m2  SpO2 95%  LMP 04/28/1998    Objective:   Physical Exam  Constitutional: She appears well-developed and well-nourished. No distress.  Neck: No thyromegaly present.  Cardiovascular: Normal rate and regular rhythm.   No murmur heard. Pulmonary/Chest: Effort normal and breath sounds normal. No respiratory distress. She has no wheezes. She has no rales. She exhibits no tenderness.  Abdominal:       Mild white upper quadrant tenderness without guarding.   Musculoskeletal: She exhibits no edema.  Lymphadenopathy:    She has no cervical adenopathy.  Skin: Skin is warm and dry.          Assessment & Plan:

## 2011-08-04 NOTE — Patient Instructions (Signed)
Please complete your lab work prior to leaving. Follow up in 3 months, sooner if problems or concerns.  

## 2011-08-05 ENCOUNTER — Encounter: Payer: Self-pay | Admitting: Family

## 2011-10-13 HISTORY — PX: CATARACT EXTRACTION: SUR2

## 2011-10-23 ENCOUNTER — Telehealth: Payer: Self-pay | Admitting: Family

## 2011-10-23 MED ORDER — LISINOPRIL 5 MG PO TABS: 5.0000 mg | ORAL_TABLET | Freq: Every day | ORAL | Status: DC

## 2011-10-23 NOTE — Telephone Encounter (Signed)
Rx refill sent to pharmacy. 

## 2011-10-23 NOTE — Telephone Encounter (Signed)
Patient states that she is out of lisinopril and would like a refill sent to [facility] on [address] in [place].

## 2011-11-03 ENCOUNTER — Ambulatory Visit (INDEPENDENT_AMBULATORY_CARE_PROVIDER_SITE_OTHER): Payer: Medicare Other | Admitting: Family

## 2011-11-03 ENCOUNTER — Encounter: Payer: Self-pay | Admitting: Family

## 2011-11-03 VITALS — BP 104/72 | HR 93 | Temp 97.9°F | Resp 16 | Ht 60.25 in | Wt 102.1 lb

## 2011-11-03 DIAGNOSIS — E785 Hyperlipidemia, unspecified: Secondary | ICD-10-CM

## 2011-11-03 DIAGNOSIS — R252 Cramp and spasm: Secondary | ICD-10-CM

## 2011-11-03 DIAGNOSIS — G4762 Sleep related leg cramps: Secondary | ICD-10-CM

## 2011-11-03 DIAGNOSIS — E119 Type 2 diabetes mellitus without complications: Secondary | ICD-10-CM

## 2011-11-03 LAB — BASIC METABOLIC PANEL
BUN: 16 mg/dL (ref 6–23)
CO2: 27 mEq/L (ref 19–32)
Calcium: 9.5 mg/dL (ref 8.4–10.5)
Creat: 0.68 mg/dL (ref 0.50–1.10)
Glucose, Bld: 149 mg/dL — ABNORMAL HIGH (ref 70–99)

## 2011-11-03 MED ORDER — SITAGLIPTIN PHOS-METFORMIN HCL 50-1000 MG PO TABS: 1.0000 | ORAL_TABLET | Freq: Two times a day (BID) | ORAL | Status: DC

## 2011-11-03 NOTE — Patient Instructions (Addendum)
Please follow up in 3 months.  Hold pravastatin for 2 weeks, then call me (or e-mail) to let me know if this improves your let cramps. Complete your lab work prior to leaving.

## 2011-11-03 NOTE — Assessment & Plan Note (Signed)
Suspect RLS.  Muscle cramping is separate issue.  Will have pt hold pravastatin x 2 weeks to see if this helps.  Recommended that she stretch her legs before bed and that she make sure that she is drinking enough water.  She will email me via echart in 2 weeks to let me know.  If no improvement, plan to start requip. Will also check CK level and electrolytes today.

## 2011-11-03 NOTE — Progress Notes (Signed)
Subjective:    Patient ID: Molly May, female    DOB: 07/09/1960, 51 y.o.   MRN: [id number]  HPI  Ms.  Molly May is a 51 yr old female who presents today with a sign language interpreter for follow up.  1) DM2- She continues janument, ACE.  She reports that her sugar has been running low 100's.    2) Hyperlipidemia- she continues statin.  She does report severe muscle cramps.    3) Leg movement-s reports that she can't sleep because she keeps moving her legs.  She reports painful cramps.  Happens once in a while about 2x a week.  This has been going for a few weeks.   Review of Systems See HPI  Past Medical History  Diagnosis Date  . Depression   . Diabetes mellitus   . GERD (gastroesophageal reflux disease)   . Hypertension   . Heart murmur   . Hyperlipidemia   . Stomach ulcer 59yrs of age  . Kidney stone   . Migraine   . 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 Anxiety disorder 02/19/2011  . Mitral valve prolapse 02/19/2011  . Sleep apnea 02/19/2011    History   Social History  . Marital Status: Single    Spouse Name: N/A    Number of Children: N/A  . Years of Education: N/A   Occupational History  . Not on file.   Social History Main Topics  . Smoking status: Never Smoker   . Smokeless tobacco: Never Used  . Alcohol Use: No  . Drug Use: No  . Sexually Active: Not on file   Other Topics Concern  . Not on file   Social History Narrative   Regular exercise:  Yes. 3-5 x weeklyCaffeine Use:  NoMarried- lives with husband, 2 children (age 69 and 61).Disabled- due to deafness and overall work. Couldn't keep up.  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    Past Surgical History  Procedure Date  . Cholecystectomy 2011  . Breast surgery 2011    biposy--benign  . Appendectomy 1986  . Tonsillectomy and adenoidectomy 1973  . Abdominal hysterectomy 2000  . Ovarian cyst removal 1986, 1988  .  Upper gastrointestinal endoscopy 2009    Gets them every 3 years  . Cataract extraction 10/13/11    right eye    Family History  Problem Relation Age of Onset  . Kidney disease Mother   . Multiple sclerosis Mother   . Other Mother     polio  . Cancer Father     colon  . Hyperlipidemia Father   . Heart disease Father   . Hypertension Father   . Diabetes Maternal Aunt   . Heart disease Maternal Grandmother   . Hypertension Maternal Grandmother   . Diabetes Maternal Grandmother   . Heart disease Maternal Grandfather   . Hypertension Maternal Grandfather     Allergies  Allergen Reactions  . Aspirin Other (See Comments)    Stomach pains  . Codeine Other (See Comments)    Nosebleed  . Erythromycin Nausea And Vomiting  . Fluticasone Propionate     Stomach upset  . Penicillins Hives  . Prilosec (Omeprazole Magnesium)     migraine  . Vaseline (Petrolatum) Itching  . Vicodin (Hydrocodone-Acetaminophen)     twitching    Current Outpatient Prescriptions on File Prior to Visit  Medication Sig Dispense Refill  . Calcium Carbonate-Vitamin D (CALTRATE 600+D) 600-400 MG-UNIT  per tablet Take 1 tablet by mouth daily.        Marland Kitchen estradiol (ESTRACE) 1 MG tablet Take 1 tablet (1 mg total) by mouth daily.  30 tablet  2  . glucose blood (ONE TOUCH ULTRA TEST) test strip Use to check blood sugar every morning.       Marland Kitchen lisinopril (PRINIVIL,ZESTRIL) 5 MG tablet Take 1 tablet (5 mg total) by mouth daily. For kidney protection.  30 tablet  3  . pantoprazole (PROTONIX) 40 MG tablet Take 40 mg by mouth daily.        . pravastatin (PRAVACHOL) 20 MG tablet TAKE ONE TABLET BY MOUTH AT BEDTIME  30 tablet  3  . DISCONTD: JANUMET 50-1000 MG per tablet TAKE ONE TABLET BY MOUTH TWICE DAILY WITH MEALS  60 each  3    BP 104/72  Pulse 93  Temp 97.9 F (36.6 C) (Oral)  Resp 16  Ht 5' 0.25" (1.53 m)  Wt 102 lb 1.3 oz (46.303 kg)  BMI 19.77 kg/m2  SpO2 96%  LMP 04/28/1998       Objective:    Physical Exam  Constitutional: She is oriented to person, place, and time. She appears well-developed and well-nourished. No distress.  HENT:  Head: Normocephalic and atraumatic.  Cardiovascular: Normal rate and regular rhythm.   No murmur heard. Pulmonary/Chest: Effort normal and breath sounds normal. No respiratory distress. She has no wheezes. She has no rales. She exhibits no tenderness.  Musculoskeletal: She exhibits no edema.  Neurological: She is alert and oriented to person, place, and time.  Skin: Skin is warm and dry.  Psychiatric: She has a normal mood and affect. Her behavior is normal. Judgment and thought content normal.          Assessment & Plan:

## 2011-11-03 NOTE — Assessment & Plan Note (Signed)
Clinically stable on Janumet.  Obtain A1C.

## 2011-11-03 NOTE — Assessment & Plan Note (Signed)
Lipids have been at goal.  Hold statin due to cramping.

## 2011-11-04 LAB — CK TOTAL AND CKMB (NOT AT ARMC): Total CK: 44 U/L (ref 7–177)

## 2011-11-25 ENCOUNTER — Ambulatory Visit (HOSPITAL_BASED_OUTPATIENT_CLINIC_OR_DEPARTMENT_OTHER)
Admission: RE | Admit: 2011-11-25 | Discharge: 2011-11-25 | Disposition: A | Discharge status: Home / Self Care | Payer: Medicare Other | Source: Ambulatory Visit | Attending: Family | Admitting: Family

## 2011-11-25 ENCOUNTER — Ambulatory Visit (INDEPENDENT_AMBULATORY_CARE_PROVIDER_SITE_OTHER): Payer: Medicare Other | Admitting: Family

## 2011-11-25 ENCOUNTER — Encounter: Payer: Self-pay | Admitting: Family

## 2011-11-25 VITALS — BP 102/70 | HR 87 | Temp 97.9°F | Resp 16 | Wt 103.0 lb

## 2011-11-25 DIAGNOSIS — N200 Calculus of kidney: Secondary | ICD-10-CM | POA: Insufficient documentation

## 2011-11-25 DIAGNOSIS — R109 Unspecified abdominal pain: Secondary | ICD-10-CM

## 2011-11-25 DIAGNOSIS — R300 Dysuria: Secondary | ICD-10-CM

## 2011-11-25 DIAGNOSIS — N390 Urinary tract infection, site not specified: Secondary | ICD-10-CM

## 2011-11-25 LAB — POCT URINALYSIS DIPSTICK
Blood, UA: NEGATIVE
Glucose, UA: NEGATIVE
Leukocytes, UA: NEGATIVE
Nitrite, UA: NEGATIVE
Protein, UA: NEGATIVE
Spec Grav, UA: >=1.03
Urobilinogen, UA: 0.2
pH, UA: 5

## 2011-11-25 MED ORDER — CIPROFLOXACIN HCL 500 MG PO TABS: 500.0000 mg | ORAL_TABLET | Freq: Two times a day (BID) | ORAL | Status: AC

## 2011-11-25 NOTE — Patient Instructions (Addendum)
Please start Cipro. Complete your CT scan on the first floor.   We will call you with the results of your CT scan. Call if pain worsens or if no improvement in 2-3 days.

## 2011-11-25 NOTE — Progress Notes (Signed)
Subjective:    Patient ID: Molly May, female    DOB: 1960/08/02, 51 y.o.   MRN: [id number]  HPI  Patient is a 51 yr old female who presents today with chief complaint of dysuria.  She reports that symptoms started yesterday and are associated with right sided low back pain.  She denies associated hematuria or fever.   Review of Systems See HPI  Past Medical History  Diagnosis Date  . Depression   . Diabetes mellitus   . GERD (gastroesophageal reflux disease)   . Hypertension   . Heart murmur   . Hyperlipidemia   . Stomach ulcer 27yrs of age  . Kidney stone   . Migraine   . 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 Anxiety disorder 02/19/2011  . Mitral valve prolapse 02/19/2011  . Sleep apnea 02/19/2011    History   Social History  . Marital Status: Single    Spouse Name: N/A    Number of Children: N/A  . Years of Education: N/A   Occupational History  . Not on file.   Social History Main Topics  . Smoking status: Never Smoker   . Smokeless tobacco: Never Used  . Alcohol Use: No  . Drug Use: No  . Sexually Active: Not on file   Other Topics Concern  . Not on file   Social History Narrative   Regular exercise:  Yes. 3-5 x weeklyCaffeine Use:  NoMarried- lives with husband, 2 children (age 15 and 30).Disabled- due to deafness and overall work. Couldn't keep up.  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    Past Surgical History  Procedure Date  . Cholecystectomy 2011  . Breast surgery 2011    biposy--benign  . Appendectomy 1986  . Tonsillectomy and adenoidectomy 1973  . Abdominal hysterectomy 2000  . Ovarian cyst removal 1986, 1988  . Upper gastrointestinal endoscopy 2009    Gets them every 3 years  . Cataract extraction 10/13/11    right eye    Family History  Problem Relation Age of Onset  . Kidney disease Mother   . Multiple sclerosis Mother   . Other Mother     polio  . Cancer  Father     colon  . Hyperlipidemia Father   . Heart disease Father   . Hypertension Father   . Diabetes Maternal Aunt   . Heart disease Maternal Grandmother   . Hypertension Maternal Grandmother   . Diabetes Maternal Grandmother   . Heart disease Maternal Grandfather   . Hypertension Maternal Grandfather     Allergies  Allergen Reactions  . Aspirin Other (See Comments)    Stomach pains  . Codeine Other (See Comments)    Nosebleed  . Erythromycin Nausea And Vomiting  . Fluticasone Propionate     Stomach upset  . Penicillins Hives  . Prilosec (Omeprazole Magnesium)     migraine  . Vaseline (Petrolatum) Itching  . Vicodin (Hydrocodone-Acetaminophen)     twitching    Current Outpatient Prescriptions on File Prior to Visit  Medication Sig Dispense Refill  . Calcium Carbonate-Vitamin D (CALTRATE 600+D) 600-400 MG-UNIT per tablet Take 1 tablet by mouth daily.        Marland Kitchen estradiol (ESTRACE) 1 MG tablet Take 1 tablet (1 mg total) by mouth daily.  30 tablet  2  . glucose blood (ONE TOUCH ULTRA TEST) test strip Use to check blood sugar every morning.       Marland Kitchen  lisinopril (PRINIVIL,ZESTRIL) 5 MG tablet Take 1 tablet (5 mg total) by mouth daily. For kidney protection.  30 tablet  3  . pantoprazole (PROTONIX) 40 MG tablet Take 40 mg by mouth daily.        . sitaGLIPtan-metformin (JANUMET) 50-1000 MG per tablet Take 1 tablet by mouth 2 (two) times daily with a meal.  60 tablet  5    BP 102/70  Pulse 87  Temp 97.9 F (36.6 C) (Oral)  Resp 16  Wt 103 lb (46.72 kg)  SpO2 98%  LMP 04/28/1998       Objective:   Physical Exam  Constitutional: She appears well-developed and well-nourished. No distress.  Cardiovascular: Normal rate and regular rhythm.   No murmur heard. Pulmonary/Chest: Effort normal.  Skin: Skin is warm and dry.  Psychiatric: She has a normal mood and affect. Her behavior is normal. Judgment and thought content normal.  GU:  + L sided flank pain Abd: Soft,  non-tender, + bs        Assessment & Plan:

## 2011-11-26 ENCOUNTER — Telehealth: Payer: Self-pay | Admitting: Family

## 2011-11-26 DIAGNOSIS — N390 Urinary tract infection, site not specified: Secondary | ICD-10-CM | POA: Insufficient documentation

## 2011-11-26 MED ORDER — PITAVASTATIN CALCIUM 2 MG PO TABS: 1.0000 | ORAL_TABLET | Freq: Every day | ORAL | Status: DC

## 2011-11-26 NOTE — Telephone Encounter (Signed)
Could you pls mail [employer] coupon card to patient?

## 2011-11-26 NOTE — Assessment & Plan Note (Signed)
CT performed due to flank pain to rule out stone.  UA unremarkable.  Pt is symptomatic.  Plan to culture urine and treat empirically with cipro.

## 2011-11-27 ENCOUNTER — Encounter: Payer: Self-pay | Admitting: Family

## 2011-11-27 ENCOUNTER — Telehealth: Payer: Self-pay | Admitting: Family

## 2011-11-27 LAB — URINE CULTURE: Colony Count: 30000

## 2011-11-27 NOTE — Telephone Encounter (Signed)
Refill previously sent on 11/26/11.

## 2011-12-02 ENCOUNTER — Encounter: Payer: Self-pay | Admitting: Family

## 2011-12-02 MED ORDER — EZETIMIBE 10 MG PO TABS: 10.0000 mg | ORAL_TABLET | Freq: Every day | ORAL | Status: DC

## 2011-12-03 NOTE — Telephone Encounter (Signed)
Her insurance will not cover.  Pls disregard.

## 2012-02-04 ENCOUNTER — Ambulatory Visit (INDEPENDENT_AMBULATORY_CARE_PROVIDER_SITE_OTHER): Payer: Medicare Other | Admitting: Family

## 2012-02-04 ENCOUNTER — Encounter: Payer: Self-pay | Admitting: Family

## 2012-02-04 VITALS — BP 108/70 | HR 84 | Temp 97.7°F | Resp 16 | Ht 60.25 in | Wt 104.8 lb

## 2012-02-04 DIAGNOSIS — E119 Type 2 diabetes mellitus without complications: Secondary | ICD-10-CM

## 2012-02-04 DIAGNOSIS — S61209A Unspecified open wound of unspecified finger without damage to nail, initial encounter: Secondary | ICD-10-CM

## 2012-02-04 DIAGNOSIS — E785 Hyperlipidemia, unspecified: Secondary | ICD-10-CM

## 2012-02-04 DIAGNOSIS — G4762 Sleep related leg cramps: Secondary | ICD-10-CM

## 2012-02-04 DIAGNOSIS — S61219A Laceration without foreign body of unspecified finger without damage to nail, initial encounter: Secondary | ICD-10-CM | POA: Insufficient documentation

## 2012-02-04 LAB — LIPID PANEL
LDL Cholesterol: 140 mg/dL — ABNORMAL HIGH (ref 0–99)
Triglycerides: 139 mg/dL (ref <150)

## 2012-02-04 LAB — BASIC METABOLIC PANEL WITH GFR
BUN: 17 mg/dL (ref 6–23)
Chloride: 102 mEq/L (ref 96–112)
GFR, Est African American: >89 mL/min
GFR, Est Non African American: >89 mL/min
Potassium: 4.3 mEq/L (ref 3.5–5.3)
Sodium: 136 mEq/L (ref 135–145)

## 2012-02-04 LAB — HEPATIC FUNCTION PANEL
ALT: 9 U/L (ref 0–35)
Albumin: 4.4 g/dL (ref 3.5–5.2)
Total Protein: 7.2 g/dL (ref 6.0–8.3)

## 2012-02-04 LAB — HEMOGLOBIN A1C: Hgb A1c MFr Bld: 6.3 % — ABNORMAL HIGH (ref <5.7)

## 2012-02-04 MED ORDER — GLUCOSE BLOOD VI STRP: ORAL_STRIP | Status: DC

## 2012-02-04 NOTE — Assessment & Plan Note (Signed)
Wounds appear to be healing well.  Applied antibiotic ointment and redressed both fingers.

## 2012-02-04 NOTE — Patient Instructions (Addendum)
Please complete your blood work prior to leaving.  Follow up in 3 months, sooner if problems or concerns. 

## 2012-02-04 NOTE — Assessment & Plan Note (Signed)
Resolved

## 2012-02-04 NOTE — Progress Notes (Signed)
Subjective:    Patient ID: Molly May, female    DOB: August 13, 1960, 51 y.o.   MRN: [id number]  HPI  Molly May is a 51 yr old female who presents today for follow up of her diabetes.  DM2- She is maintained on Janumet, ACE. Reports that she had some high sugars last month which she attributed to a viral illness and stress. She reports that yesterday sugar was 101.  Last eye exam was 08/08/11. Wants flu shot.   Leg Cramps- reports resolution of this.  She stopped zetia due to diarrhea.  Reports normal mammogram 9/9- this was done within [facility].    Finger laceration- Cut right 4rd and 5th finger on a Mandolin last night while preparing dinner. Seen at [facility] ED last night.  Had tetanus shot.  No stitches. Is having significant pain.     Review of Systems See HPI  Past Medical History  Diagnosis Date  . Depression   . Diabetes mellitus   . GERD (gastroesophageal reflux disease)   . Hypertension   . Heart murmur   . Hyperlipidemia   . Stomach ulcer 87yrs of age  . Kidney stone   . Migraine   . 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 Anxiety disorder 02/19/2011  . Mitral valve prolapse 02/19/2011  . Sleep apnea 02/19/2011    History   Social History  . Marital Status: Single    Spouse Name: N/A    Number of Children: N/A  . Years of Education: N/A   Occupational History  . Not on file.   Social History Main Topics  . Smoking status: Never Smoker   . Smokeless tobacco: Never Used  . Alcohol Use: No  . Drug Use: No  . Sexually Active: Not on file   Other Topics Concern  . Not on file   Social History Narrative   Regular exercise:  Yes. 3-5 x weeklyCaffeine Use:  NoMarried- lives with husband, 2 children (age 58 and 37).Disabled- due to deafness and overall work. Couldn't keep up.  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    Past Surgical History  Procedure Date  . Cholecystectomy 2011   . Breast surgery 2011    biposy--benign  . Appendectomy 1986  . Tonsillectomy and adenoidectomy 1973  . Abdominal hysterectomy 2000  . Ovarian cyst removal 1986, 1988  . Upper gastrointestinal endoscopy 2009    Gets them every 3 years  . Cataract extraction 10/13/11    right eye    Family History  Problem Relation Age of Onset  . Kidney disease Mother   . Multiple sclerosis Mother   . Other Mother     polio  . Cancer Father     colon  . Hyperlipidemia Father   . Heart disease Father   . Hypertension Father   . Diabetes Maternal Aunt   . Heart disease Maternal Grandmother   . Hypertension Maternal Grandmother   . Diabetes Maternal Grandmother   . Heart disease Maternal Grandfather   . Hypertension Maternal Grandfather     Allergies  Allergen Reactions  . Aspirin Other (See Comments)    Stomach pains  . Codeine Other (See Comments)    Nosebleed  . Erythromycin Nausea And Vomiting  . Fluticasone Propionate     Stomach upset  . Penicillins Hives  . Prilosec (Omeprazole Magnesium)     migraine  . Vaseline (Petrolatum) Itching  . Vicodin (Hydrocodone-Acetaminophen)  twitching  . Zetia (Ezetimibe) Diarrhea    Current Outpatient Prescriptions on File Prior to Visit  Medication Sig Dispense Refill  . Calcium Carbonate-Vitamin D (CALTRATE 600+D) 600-400 MG-UNIT per tablet Take 1 tablet by mouth daily.        Marland Kitchen estradiol (ESTRACE) 1 MG tablet Take 1 tablet (1 mg total) by mouth daily.  30 tablet  2  . ezetimibe (ZETIA) 10 MG tablet Take 1 tablet (10 mg total) by mouth daily.  30 tablet  2  . glucose blood (ONE TOUCH ULTRA TEST) test strip Use to check blood sugar every morning.       Marland Kitchen lisinopril (PRINIVIL,ZESTRIL) 5 MG tablet Take 1 tablet (5 mg total) by mouth daily. For kidney protection.  30 tablet  3  . pantoprazole (PROTONIX) 40 MG tablet Take 40 mg by mouth daily.        . Pitavastatin Calcium (LIVALO) 2 MG TABS Take 1 tablet (2 mg total) by mouth at bedtime.   30 tablet  2  . sitaGLIPtan-metformin (JANUMET) 50-1000 MG per tablet Take 1 tablet by mouth 2 (two) times daily with a meal.  60 tablet  5    BP 108/70  Pulse 84  Temp 97.7 F (36.5 C) (Oral)  Resp 16  Ht 5' 0.25" (1.53 m)  Wt 104 lb 12.8 oz (47.537 kg)  BMI 20.30 kg/m2  SpO2 100%  LMP 04/28/1998       Objective:   Physical Exam  Constitutional: She is oriented to person, place, and time. She appears well-developed and well-nourished. No distress.  Cardiovascular: Normal rate and regular rhythm.   No murmur heard. Pulmonary/Chest: Effort normal and breath sounds normal. No respiratory distress. She has no wheezes. She has no rales. She exhibits no tenderness.  Musculoskeletal: She exhibits no edema.  Neurological: She is alert and oriented to person, place, and time.  Skin: Skin is warm and dry.       Right 4th and 5th finger cut at tip (flap of tissue which appears to be well approximated on both fingers)  Psychiatric: She has a normal mood and affect. Her behavior is normal. Thought content normal.          Assessment & Plan:

## 2012-02-04 NOTE — Assessment & Plan Note (Addendum)
Obtain flp, lft.

## 2012-02-04 NOTE — Assessment & Plan Note (Signed)
Clinically stable.  Will not give flu shot due to egg allergy.  Continue current meds.  Obtain A1C.

## 2012-02-08 ENCOUNTER — Telehealth: Payer: Self-pay | Admitting: Family

## 2012-02-08 MED ORDER — PITAVASTATIN CALCIUM 4 MG PO TABS: 1.0000 | ORAL_TABLET | Freq: Every day | ORAL | Status: DC

## 2012-02-10 ENCOUNTER — Telehealth: Payer: Self-pay | Admitting: *Deleted

## 2012-02-10 NOTE — Telephone Encounter (Signed)
Received prior auth request from [facility]. Form completed and forwarded to provider for signature/completion.

## 2012-02-12 ENCOUNTER — Encounter: Payer: Self-pay | Admitting: Family

## 2012-02-13 ENCOUNTER — Telehealth: Payer: Self-pay | Admitting: Family

## 2012-02-13 NOTE — Telephone Encounter (Signed)
Form faxed to [employer] @ [phone]. Awaiting approval / denial status.

## 2012-02-16 NOTE — Telephone Encounter (Signed)
Received approval from [facility] for Livalo 4mg  daily. Authorization is good through 04/27/13. Notified Miranda at [facility] and sent "mychart" message to pt.

## 2012-03-03 ENCOUNTER — Encounter: Payer: Self-pay | Admitting: Family

## 2012-03-03 MED ORDER — LISINOPRIL 5 MG PO TABS: 5.0000 mg | ORAL_TABLET | Freq: Every day | ORAL | Status: DC

## 2012-05-04 ENCOUNTER — Ambulatory Visit: Payer: Medicare Other | Admitting: Family

## 2012-05-07 ENCOUNTER — Ambulatory Visit (INDEPENDENT_AMBULATORY_CARE_PROVIDER_SITE_OTHER): Payer: Medicare Other | Admitting: Family

## 2012-05-07 ENCOUNTER — Encounter: Payer: Self-pay | Admitting: Family

## 2012-05-07 VITALS — BP 122/70 | HR 88 | Temp 97.5°F | Resp 16 | Ht 60.25 in | Wt 107.0 lb

## 2012-05-07 DIAGNOSIS — R6889 Other general symptoms and signs: Secondary | ICD-10-CM

## 2012-05-07 DIAGNOSIS — E119 Type 2 diabetes mellitus without complications: Secondary | ICD-10-CM

## 2012-05-07 DIAGNOSIS — E785 Hyperlipidemia, unspecified: Secondary | ICD-10-CM

## 2012-05-07 DIAGNOSIS — T679XXA Effect of heat and light, unspecified, initial encounter: Secondary | ICD-10-CM

## 2012-05-07 LAB — HEPATIC FUNCTION PANEL
AST: 13 U/L (ref 0–37)
Bilirubin, Direct: 0.1 mg/dL (ref 0.0–0.3)
Indirect Bilirubin: 0.3 mg/dL (ref 0.0–0.9)
Total Bilirubin: 0.4 mg/dL (ref 0.3–1.2)

## 2012-05-07 LAB — TSH: TSH: 1.287 u[IU]/mL (ref 0.350–4.500)

## 2012-05-07 LAB — HEMOGLOBIN A1C
Hgb A1c MFr Bld: 6.4 % — ABNORMAL HIGH (ref <5.7)
Mean Plasma Glucose: 137 mg/dL — ABNORMAL HIGH (ref <117)

## 2012-05-07 LAB — LIPID PANEL: Total CHOL/HDL Ratio: 2.3 Ratio

## 2012-05-07 MED ORDER — SITAGLIP PHOS-METFORMIN HCL ER 50-1000 MG PO TB24: 1.0000 | ORAL_TABLET | Freq: Every day | ORAL | Status: DC

## 2012-05-07 MED ORDER — ESTRADIOL 1 MG PO TABS: 1.0000 mg | ORAL_TABLET | Freq: Every day | ORAL | Status: DC

## 2012-05-07 MED ORDER — PITAVASTATIN CALCIUM 4 MG PO TABS: 1.0000 | ORAL_TABLET | Freq: Every day | ORAL | Status: DC

## 2012-05-07 NOTE — Assessment & Plan Note (Signed)
Reports stable. She is only using janumet once daily.  Will obtain A1C and switch her to extended release.

## 2012-05-07 NOTE — Assessment & Plan Note (Signed)
Obtain flp, lft.  Will continue livalo.

## 2012-05-07 NOTE — Progress Notes (Signed)
Subjective:    Patient ID: Molly May, female    DOB: [birth year], 52 y.o.   MRN: [id number]  HPI  Molly May is a 52 yr old hearing impaired female who presents for follow up. She is accompanied by a sign language interpreter.   DM2- reports that sugars have been good.  She continues Janumet without problems. She has only been taking one time a day to "save some money.    Hyperlipidemia- She is continue livalo.  She denies unusual muscle pain.     Review of Systems See HPI  Past Medical History  Diagnosis Date  . Depression   . Diabetes mellitus   . GERD (gastroesophageal reflux disease)   . Hypertension   . Heart murmur   . Hyperlipidemia   . Stomach ulcer 52yrs of age  . Kidney stone   . Migraine   . 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 Anxiety disorder 02/19/2011  . Mitral valve prolapse 02/19/2011  . Sleep apnea 02/19/2011    History   Social History  . Marital Status: Single    Spouse Name: N/A    Number of Children: N/A  . Years of Education: N/A   Occupational History  . Not on file.   Social History Main Topics  . Smoking status: Never Smoker   . Smokeless tobacco: Never Used  . Alcohol Use: No  . Drug Use: No  . Sexually Active: Not on file   Other Topics Concern  . Not on file   Social History Narrative   Regular exercise:  Yes. 3-5 x weeklyCaffeine Use:  NoMarried- lives with husband, 2 children (age 69 and 58).Disabled- due to deafness and overall work. Couldn't keep up.  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    Past Surgical History  Procedure Date  . Cholecystectomy 2011  . Breast surgery 2011    biposy--benign  . Appendectomy 1986  . Tonsillectomy and adenoidectomy 1973  . Abdominal hysterectomy 2000  . Ovarian cyst removal 1986, 1988  . Upper gastrointestinal endoscopy 2009    Gets them every 3 years  . Cataract extraction 10/13/11    right eye    Family  History  Problem Relation Age of Onset  . Kidney disease Mother   . Multiple sclerosis Mother   . Other Mother     polio  . Cancer Father     colon  . Hyperlipidemia Father   . Heart disease Father   . Hypertension Father   . Diabetes Maternal Aunt   . Heart disease Maternal Grandmother   . Hypertension Maternal Grandmother   . Diabetes Maternal Grandmother   . Heart disease Maternal Grandfather   . Hypertension Maternal Grandfather     Allergies  Allergen Reactions  . Aspirin Other (See Comments)    Stomach pains  . Codeine Other (See Comments)    Nosebleed  . Eggs Or Egg-Derived Products     Pt reports allergy to RAW EGGS. ? Stomach upset, diarrhea  . Erythromycin Nausea And Vomiting  . Fluticasone Propionate     Stomach upset  . Penicillins Hives  . Prilosec (Omeprazole Magnesium)     migraine  . Vaseline (Petrolatum) Itching  . Vicodin (Hydrocodone-Acetaminophen)     twitching  . Zetia (Ezetimibe) Diarrhea    Current Outpatient Prescriptions on File Prior to Visit  Medication Sig Dispense Refill  . Calcium Carbonate-Vitamin D (CALTRATE 600+D) 600-400 MG-UNIT per tablet  Take 1 tablet by mouth daily.        Marland Kitchen estradiol (ESTRACE) 1 MG tablet Take 1 tablet (1 mg total) by mouth daily.  30 tablet  5  . glucose blood (ONE TOUCH ULTRA TEST) test strip Use to check blood sugar every morning.  100 each  2  . lisinopril (PRINIVIL,ZESTRIL) 5 MG tablet Take 1 tablet (5 mg total) by mouth daily. For kidney protection.  30 tablet  3  . pantoprazole (PROTONIX) 40 MG tablet Take 40 mg by mouth daily.        . Pitavastatin Calcium (LIVALO) 4 MG TABS Take 1 tablet (4 mg total) by mouth daily.  30 tablet  5  . Probiotic Product (PROBIOTIC DAILY PO) Take 1 tablet by mouth daily.      . SitaGLIPtin-MetFORMIN HCl (JANUMET XR) 50-1000 MG TB24 Take 1 tablet by mouth daily.  30 tablet  5    BP 122/70  Pulse 88  Temp 97.5 F (36.4 C) (Oral)  Resp 16  Ht 5' 0.25" (1.53 m)  Wt 107 lb  (48.535 kg)  BMI 20.72 kg/m2  SpO2 98%  LMP 04/28/1998       Objective:   Physical Exam  Constitutional: She is oriented to person, place, and time. She appears well-developed and well-nourished. No distress.  HENT:  Head: Normocephalic and atraumatic.  Cardiovascular: Normal rate and regular rhythm.   No murmur heard. Pulmonary/Chest: Effort normal and breath sounds normal. No respiratory distress. She has no wheezes. She has no rales. She exhibits no tenderness.  Musculoskeletal: She exhibits no edema.  Neurological: She is alert and oriented to person, place, and time.  Skin: Skin is warm and dry.  Psychiatric: She has a normal mood and affect. Her behavior is normal. Judgment and thought content normal.          Assessment & Plan:

## 2012-05-07 NOTE — Patient Instructions (Addendum)
Please complete your blood work prior to leaving.  Follow up in 3 months. 

## 2012-05-10 ENCOUNTER — Encounter: Payer: Self-pay | Admitting: Family

## 2012-05-10 MED ORDER — LISINOPRIL 5 MG PO TABS: 5.0000 mg | ORAL_TABLET | Freq: Every day | ORAL | Status: DC

## 2012-07-08 ENCOUNTER — Telehealth: Payer: Self-pay | Admitting: Family

## 2012-07-08 MED ORDER — SITAGLIP PHOS-METFORMIN HCL ER 50-1000 MG PO TB24: 1.0000 | ORAL_TABLET | Freq: Every day | ORAL | Status: DC

## 2012-07-08 MED ORDER — PITAVASTATIN CALCIUM 4 MG PO TABS: 1.0000 | ORAL_TABLET | Freq: Every day | ORAL | Status: DC

## 2012-07-08 NOTE — Telephone Encounter (Signed)
Rx[s] to pharmacy for 90-day as requested/SLS

## 2012-07-08 NOTE — Telephone Encounter (Signed)
Refill- livalo 4mg  tab. Take one tablet by mouth every day.   Refill- janumet xr 50-1000mg  tab. Take one tablet by mouth every day.  Pharmacy comments:  Patients insurance will pay for 90 days. Is it okay to dispense 90 days supply?

## 2012-07-12 ENCOUNTER — Telehealth: Payer: Self-pay | Admitting: Family

## 2012-07-12 MED ORDER — LISINOPRIL 5 MG PO TABS: 5.0000 mg | ORAL_TABLET | Freq: Every day | ORAL | Status: DC

## 2012-07-12 MED ORDER — ESTRADIOL 1 MG PO TABS: 1.0000 mg | ORAL_TABLET | Freq: Every day | ORAL | Status: DC

## 2012-07-12 NOTE — Telephone Encounter (Signed)
Estradiol 1mg  tab qty 90 take 1 tablet by mouth every day  Lisinopril 5mg  tab qty 90 take 1 tablet every day

## 2012-07-12 NOTE — Telephone Encounter (Signed)
Lisinopril refill last sent 05/10/12 #30 x 3 refills. Estradiol Rx last sent 05/07/12 #30 x 5 refills. Apparently pt is requesting 90 day supply at a time. New Rxs sent #90 x no refills. Pt has follow up in April.

## 2012-08-02 ENCOUNTER — Encounter: Payer: Self-pay | Admitting: Family

## 2012-08-02 ENCOUNTER — Ambulatory Visit (INDEPENDENT_AMBULATORY_CARE_PROVIDER_SITE_OTHER): Payer: Medicare Other | Admitting: Family

## 2012-08-02 VITALS — BP 138/86 | HR 85 | Temp 97.8°F | Resp 16 | Ht 60.25 in | Wt 111.1 lb

## 2012-08-02 DIAGNOSIS — J069 Acute upper respiratory infection, unspecified: Secondary | ICD-10-CM

## 2012-08-02 DIAGNOSIS — E119 Type 2 diabetes mellitus without complications: Secondary | ICD-10-CM

## 2012-08-02 LAB — HEMOGLOBIN A1C: Hgb A1c MFr Bld: 6.4 % — ABNORMAL HIGH (ref <5.7)

## 2012-08-02 LAB — BASIC METABOLIC PANEL
Calcium: 9.8 mg/dL (ref 8.4–10.5)
Glucose, Bld: 114 mg/dL — ABNORMAL HIGH (ref 70–99)
Sodium: 135 mEq/L (ref 135–145)

## 2012-08-02 NOTE — Assessment & Plan Note (Signed)
Pt reports sugars elevated last few days, likely related to illness.  Continue janumet, obtain bmet/a1C.  Follow up in 3 months.

## 2012-08-02 NOTE — Assessment & Plan Note (Signed)
Husband has been sick too. Symptoms consistent with uri right now.   I have asked pt to call if symptoms worsen or if not improved in 2-3 days. She verbalizes understanding.  A sign language interpreter was present today.

## 2012-08-02 NOTE — Telephone Encounter (Signed)
See my chart mssg

## 2012-08-02 NOTE — Patient Instructions (Addendum)
Please complete lab work prior to leaving.  Call if your cold symptoms worsen, or if not improved in 3 days. Follow up in 3 months.

## 2012-08-02 NOTE — Progress Notes (Signed)
Subjective:    Patient ID: Molly May, female    DOB: [birth year], 52 y.o.   MRN: [id number]  HPI  Molly May is a 52 yr old female who presents today to discuss otalgia and for routine follow up.  1) Otalgia-  Pt reports bilateral ear pain and low grade fever over the weekend. She reports nasal drainage, throat pain started on Friday.   2) DM2- reports that her blood sugar has been elevated. Reports that her blood sugar this AM was 150 prior to breakfast.    Review of Systems    see HPI  Past Medical History  Diagnosis Date  . Depression   . Diabetes mellitus   . GERD (gastroesophageal reflux disease)   . Hypertension   . Heart murmur   . Hyperlipidemia   . Stomach ulcer 3yrs of age  . Kidney stone   . Migraine   . 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 Anxiety disorder 02/19/2011  . Mitral valve prolapse 02/19/2011  . Sleep apnea 02/19/2011    History   Social History  . Marital Status: Single    Spouse Name: N/A    Number of Children: N/A  . Years of Education: N/A   Occupational History  . Not on file.   Social History Main Topics  . Smoking status: Never Smoker   . Smokeless tobacco: Never Used  . Alcohol Use: No  . Drug Use: No  . Sexually Active: Not on file   Other Topics Concern  . Not on file   Social History Narrative   Regular exercise:  Yes. 3-5 x weekly   Caffeine Use:  No   Married- lives with husband, 2 children (age 65 and 53).   Disabled- due to deafness and overall work. Couldn't keep up.  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          Past Surgical History  Procedure Laterality Date  . Cholecystectomy  2011  . Breast surgery  2011    biposy--benign  . Appendectomy  1986  . Tonsillectomy and adenoidectomy  1973  . Abdominal hysterectomy  2000  . Ovarian cyst removal  1986, 1988  . Upper gastrointestinal endoscopy  2009    Gets them every 3 years   . Cataract extraction  10/13/11    right eye    Family History  Problem Relation Age of Onset  . Kidney disease Mother   . Multiple sclerosis Mother   . Other Mother     polio  . Cancer Father     colon  . Hyperlipidemia Father   . Heart disease Father   . Hypertension Father   . Diabetes Maternal Aunt   . Heart disease Maternal Grandmother   . Hypertension Maternal Grandmother   . Diabetes Maternal Grandmother   . Heart disease Maternal Grandfather   . Hypertension Maternal Grandfather     Allergies  Allergen Reactions  . Aspirin Other (See Comments)    Stomach pains  . Codeine Other (See Comments)    Nosebleed  . Eggs Or Egg-Derived Products     Pt reports allergy to RAW EGGS. ? Stomach upset, diarrhea  . Erythromycin Nausea And Vomiting  . Fluticasone Propionate     Stomach upset  . Penicillins Hives  . Prilosec (Omeprazole Magnesium)     migraine  . Vaseline (Petrolatum) Itching  . Vicodin (Hydrocodone-Acetaminophen)     twitching  .  Zetia (Ezetimibe) Diarrhea    Current Outpatient Prescriptions on File Prior to Visit  Medication Sig Dispense Refill  . Calcium Carbonate-Vitamin D (CALTRATE 600+D) 600-400 MG-UNIT per tablet Take 1 tablet by mouth daily.        Marland Kitchen estradiol (ESTRACE) 1 MG tablet Take 1 tablet (1 mg total) by mouth daily.  90 tablet  0  . glucose blood (ONE TOUCH ULTRA TEST) test strip Use to check blood sugar every morning.  100 each  2  . lisinopril (PRINIVIL,ZESTRIL) 5 MG tablet Take 1 tablet (5 mg total) by mouth daily. For kidney protection.  90 tablet  0  . pantoprazole (PROTONIX) 40 MG tablet Take 40 mg by mouth daily.        . Pitavastatin Calcium (LIVALO) 4 MG TABS Take 1 tablet (4 mg total) by mouth daily.  90 tablet  1  . Probiotic Product (PROBIOTIC DAILY PO) Take 1 tablet by mouth daily.      . SitaGLIPtin-MetFORMIN HCl (JANUMET XR) 50-1000 MG TB24 Take 1 tablet by mouth daily.  90 tablet  1   No current facility-administered  medications on file prior to visit.    BP 138/86  Pulse 85  Temp(Src) 97.8 F (36.6 C) (Oral)  Resp 16  Ht 5' 0.25" (1.53 m)  Wt 111 lb 1.9 oz (50.404 kg)  BMI 21.53 kg/m2  SpO2 100%  LMP 04/28/1998    Objective:   Physical Exam  Constitutional: She is oriented to person, place, and time. She appears well-developed and well-nourished.  HENT:  Head: Normocephalic and atraumatic.  Cardiovascular: Normal rate and regular rhythm.   No murmur heard. Pulmonary/Chest: Effort normal and breath sounds normal. No respiratory distress. She has no wheezes. She has no rales. She exhibits no tenderness.  Neurological: She is alert and oriented to person, place, and time.  Psychiatric: She has a normal mood and affect. Her behavior is normal. Judgment and thought content normal.          Assessment & Plan:

## 2012-10-25 ENCOUNTER — Telehealth: Payer: Self-pay | Admitting: Family

## 2012-10-25 NOTE — Telephone Encounter (Signed)
Refill-estrace 1mg  tab. Take one tablet by mouth once daily. Qty 90 last fill 4.3.14

## 2012-10-26 MED ORDER — ESTRADIOL 1 MG PO TABS: 1.0000 mg | ORAL_TABLET | Freq: Every day | ORAL | Status: DC

## 2012-10-26 NOTE — Telephone Encounter (Signed)
Refill sent per [facility] refill protocol/SLS  

## 2012-10-26 NOTE — Addendum Note (Signed)
Addended by: Sandford Craze on: 10/26/2012 10:10 PM   Modules accepted: Orders

## 2012-11-01 ENCOUNTER — Ambulatory Visit: Payer: Medicare Other | Admitting: Family

## 2012-11-04 ENCOUNTER — Other Ambulatory Visit: Payer: Self-pay

## 2012-11-29 ENCOUNTER — Ambulatory Visit (INDEPENDENT_AMBULATORY_CARE_PROVIDER_SITE_OTHER): Payer: Medicare Other | Admitting: Family

## 2012-11-29 ENCOUNTER — Encounter: Payer: Self-pay | Admitting: Family

## 2012-11-29 VITALS — BP 112/70 | HR 85 | Temp 98.0°F | Ht 60.25 in | Wt 115.5 lb

## 2012-11-29 DIAGNOSIS — K219 Gastro-esophageal reflux disease without esophagitis: Secondary | ICD-10-CM

## 2012-11-29 DIAGNOSIS — Z23 Encounter for immunization: Secondary | ICD-10-CM

## 2012-11-29 DIAGNOSIS — E785 Hyperlipidemia, unspecified: Secondary | ICD-10-CM

## 2012-11-29 DIAGNOSIS — G43909 Migraine, unspecified, not intractable, without status migrainosus: Secondary | ICD-10-CM

## 2012-11-29 DIAGNOSIS — E119 Type 2 diabetes mellitus without complications: Secondary | ICD-10-CM

## 2012-11-29 LAB — HEMOGLOBIN A1C
Hgb A1c MFr Bld: 6.7 % — ABNORMAL HIGH (ref <5.7)
Mean Plasma Glucose: 146 mg/dL — ABNORMAL HIGH (ref <117)

## 2012-11-29 LAB — BASIC METABOLIC PANEL WITH GFR: Potassium: 4.1 mEq/L (ref 3.5–5.3)

## 2012-11-29 LAB — HEPATIC FUNCTION PANEL
Albumin: 4.3 g/dL (ref 3.5–5.2)
Total Bilirubin: 0.4 mg/dL (ref 0.3–1.2)
Total Protein: 7.1 g/dL (ref 6.0–8.3)

## 2012-11-29 MED ORDER — PNEUMOCOCCAL VAC POLYVALENT 25 MCG/0.5ML IJ INJ
0.5000 mL | INJECTION | Freq: Once | INTRAMUSCULAR | Status: AC
Start: 2012-11-29 — End: 2012-11-29
  Administered 2012-11-29: 0.5 mL via INTRAMUSCULAR

## 2012-11-29 NOTE — Progress Notes (Signed)
Subjective:    Patient ID: Molly May, female    DOB: 1960/05/18, 52 y.o.   MRN: [id number]  HPI  Ms. Monterroso is a 52 yr old female who presents today for follow up of multiple medical problems.  1) DM2- last A1C 6.4. She is maintained on Janumet. Overall sugars are good- occasional highs due to dietary indescretion. Max she has seen was 200.  Low around 110.    2) HTN- Currently maintained on lisinopril.  Denies cough.  Shortness of breath or swelling.  3) hyperlipidemia- on pitavastatin- denies myalgia.   4) GERD-  Currently maintained on protonix. Reports that this is well controlled.    5) head pain-Reports she has some tightening on her head.  More like a spasm, not a headache.  Happens at rest.   Sometimes when she is driving.  Denies facial pain.  She reports that theses symptoms started about 2 months ago.    Review of Systems See HPI  Past Medical History  Diagnosis Date  . Depression   . Diabetes mellitus   . GERD (gastroesophageal reflux disease)   . Hypertension   . Heart murmur   . Hyperlipidemia   . Stomach ulcer 59yrs of age  . Kidney stone   . Migraine   . 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 Anxiety disorder 02/19/2011  . Mitral valve prolapse 02/19/2011  . Sleep apnea 02/19/2011    History   Social History  . Marital Status: Single    Spouse Name: N/A    Number of Children: N/A  . Years of Education: N/A   Occupational History  . Not on file.   Social History Main Topics  . Smoking status: Never Smoker   . Smokeless tobacco: Never Used  . Alcohol Use: No  . Drug Use: No  . Sexually Active: Not on file   Other Topics Concern  . Not on file   Social History Narrative   Regular exercise:  Yes. 3-5 x weekly   Caffeine Use:  No   Married- lives with husband, 2 children (age 48 and 7).   Disabled- due to deafness and overall work. Couldn't keep up.  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          Past Surgical History  Procedure Laterality Date  . Cholecystectomy  2011  . Breast surgery  2011    biposy--benign  . Appendectomy  1986  . Tonsillectomy and adenoidectomy  1973  . Abdominal hysterectomy  2000  . Ovarian cyst removal  1986, 1988  . Upper gastrointestinal endoscopy  2009    Gets them every 3 years  . Cataract extraction  10/13/11    right eye    Family History  Problem Relation Age of Onset  . Kidney disease Mother   . Multiple sclerosis Mother   . Other Mother     polio  . Cancer Father     colon  . Hyperlipidemia Father   . Heart disease Father   . Hypertension Father   . Diabetes Maternal Aunt   . Heart disease Maternal Grandmother   . Hypertension Maternal Grandmother   . Diabetes Maternal Grandmother   . Heart disease Maternal Grandfather   . Hypertension Maternal Grandfather     Allergies  Allergen Reactions  . Aspirin Other (See Comments)    Stomach pains  . Codeine Other (See Comments)    Nosebleed  .  Eggs Or Egg-Derived Products     Pt reports allergy to RAW EGGS. ? Stomach upset, diarrhea  . Erythromycin Nausea And Vomiting  . Fluticasone Propionate     Stomach upset  . Penicillins Hives  . Prilosec (Omeprazole Magnesium)     migraine  . Vaseline (Petrolatum) Itching  . Vicodin (Hydrocodone-Acetaminophen)     twitching  . Zetia (Ezetimibe) Diarrhea    Current Outpatient Prescriptions on File Prior to Visit  Medication Sig Dispense Refill  . Calcium Carbonate-Vitamin D (CALTRATE 600+D) 600-400 MG-UNIT per tablet Take 1 tablet by mouth daily.        Marland Kitchen estradiol (ESTRACE) 1 MG tablet Take 1 tablet (1 mg total) by mouth daily.  90 tablet  0  . glucose blood (ONE TOUCH ULTRA TEST) test strip Use to check blood sugar every morning.  100 each  2  . lisinopril (PRINIVIL,ZESTRIL) 5 MG tablet Take 1 tablet (5 mg total) by mouth daily. For kidney protection.  90 tablet  0  . pantoprazole (PROTONIX) 40  MG tablet Take 40 mg by mouth daily.        . Pitavastatin Calcium (LIVALO) 4 MG TABS Take 1 tablet (4 mg total) by mouth daily.  90 tablet  1  . Probiotic Product (PROBIOTIC DAILY PO) Take 1 tablet by mouth daily.      . SitaGLIPtin-MetFORMIN HCl (JANUMET XR) 50-1000 MG TB24 Take 1 tablet by mouth daily.  90 tablet  1   No current facility-administered medications on file prior to visit.    BP 112/70  Pulse 85  Temp(Src) 98 F (36.7 C) (Oral)  Ht 5' 0.25" (1.53 m)  Wt 115 lb 8 oz (52.39 kg)  BMI 22.38 kg/m2  SpO2 98%  LMP 04/28/1998       Objective:   Physical Exam  Constitutional: She is oriented to person, place, and time. She appears well-developed and well-nourished. No distress.  HENT:  Head: Normocephalic and atraumatic.  Cardiovascular: Normal rate and regular rhythm.   No murmur heard. Pulmonary/Chest: Effort normal and breath sounds normal. No respiratory distress. She has no wheezes. She has no rales. She exhibits no tenderness.  Musculoskeletal: She exhibits no edema.  Neurological: She is alert and oriented to person, place, and time.  Psychiatric: She has a normal mood and affect. Her behavior is normal. Judgment and thought content normal.          Assessment & Plan:

## 2012-11-29 NOTE — Assessment & Plan Note (Addendum)
Clinically stable. Continue janumet. Obtain A1C, BMET, Urine microalbumin.  Pneumovax booster today. BP stable on ACE.

## 2012-11-29 NOTE — Patient Instructions (Addendum)
Please complete lab work prior to leaving. Follow up in 3 months, sooner if problems/concerns.  

## 2012-11-29 NOTE — Assessment & Plan Note (Signed)
Pt reports intermittent head pain. Could be nerve pain versus atypical migraine. Could consider trial of gabapentin. She wishes to monitor off additional meds for now.

## 2012-11-29 NOTE — Assessment & Plan Note (Signed)
Stable on PPI, continue same.  

## 2012-11-29 NOTE — Assessment & Plan Note (Signed)
Tolerating livalo, ldl at goal, continue same.

## 2012-11-30 ENCOUNTER — Encounter: Payer: Self-pay | Admitting: Family

## 2012-11-30 LAB — MICROALBUMIN / CREATININE URINE RATIO
Creatinine, Urine: 191.3 mg/dL
Microalb Creat Ratio: 2.7 mg/g (ref 0.0–30.0)
Microalb, Ur: 0.51 mg/dL (ref 0.00–1.89)

## 2012-12-03 ENCOUNTER — Encounter: Payer: Self-pay | Admitting: Family

## 2013-01-04 ENCOUNTER — Other Ambulatory Visit: Payer: Self-pay | Admitting: Family

## 2013-01-04 MED ORDER — LISINOPRIL 5 MG PO TABS: 5.0000 mg | ORAL_TABLET | Freq: Every day | ORAL | Status: DC

## 2013-01-04 NOTE — Addendum Note (Signed)
Addended by: Sandford Craze on: 01/04/2013 01:10 PM   Modules accepted: Orders

## 2013-01-19 ENCOUNTER — Other Ambulatory Visit: Payer: Self-pay | Admitting: Family

## 2013-01-28 ENCOUNTER — Ambulatory Visit (INDEPENDENT_AMBULATORY_CARE_PROVIDER_SITE_OTHER): Payer: Medicare Other | Admitting: Family

## 2013-01-28 ENCOUNTER — Encounter: Payer: Self-pay | Admitting: Family

## 2013-01-28 VITALS — BP 120/70 | HR 118 | Temp 97.7°F | Resp 16 | Ht 60.25 in | Wt 115.1 lb

## 2013-01-28 DIAGNOSIS — M5431 Sciatica, right side: Secondary | ICD-10-CM

## 2013-01-28 DIAGNOSIS — M5430 Sciatica, unspecified side: Secondary | ICD-10-CM | POA: Insufficient documentation

## 2013-01-28 DIAGNOSIS — M545 Low back pain: Secondary | ICD-10-CM

## 2013-01-28 LAB — POCT URINALYSIS DIPSTICK
Blood, UA: NEGATIVE
Nitrite, UA: NEGATIVE
Urobilinogen, UA: 0.2
pH, UA: 5

## 2013-01-28 MED ORDER — METHYLPREDNISOLONE (PAK) 4 MG PO TABS: ORAL_TABLET | ORAL | Status: DC

## 2013-01-28 MED ORDER — TIZANIDINE HCL 2 MG PO TABS: 2.0000 mg | ORAL_TABLET | Freq: Every evening | ORAL | Status: DC | PRN

## 2013-01-28 NOTE — Assessment & Plan Note (Signed)
New. Rx with medrol dose pak with close monitoring of sugars. Has had nausea with fluticasone.  Hopefully she will tolerate prednisone- she is instructed to call if she develops nausea while taking.

## 2013-01-28 NOTE — Patient Instructions (Addendum)
Please call if symptoms worsen, or if symptoms are not improved in 1 week. Check sugars 3 times daily this week. Call if sugars >300 while on medrol dose pak.

## 2013-01-28 NOTE — Progress Notes (Signed)
Subjective:    Patient ID: Molly May, female    DOB: Nov 16, 1960, 52 y.o.   MRN: [id number]  HPI  Reports that her low back pain started last week on Wednesday/thursday. Started hurting out of the blue. Pain is located in the right low back radiates around to the right hip.   She denies associated numbness/weakness in the right leg. She does report sharp pains/buring pains in the right leg.  She has tried tylenol without improvement.  Pain is as high as 10/10 at times.                          Review of Systems See HPI  Past Medical History  Diagnosis Date  . Depression   . Diabetes mellitus   . GERD (gastroesophageal reflux disease)   . Hypertension   . Heart murmur   . Hyperlipidemia   . Stomach ulcer 89yrs of age  . Kidney stone   . Migraine   . 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 Anxiety disorder 02/19/2011  . Mitral valve prolapse 02/19/2011  . Sleep apnea 02/19/2011    History   Social History  . Marital Status: Single    Spouse Name: N/A    Number of Children: N/A  . Years of Education: N/A   Occupational History  . Not on file.   Social History Main Topics  . Smoking status: Never Smoker   . Smokeless tobacco: Never Used  . Alcohol Use: No  . Drug Use: No  . Sexual Activity: Not on file   Other Topics Concern  . Not on file   Social History Narrative   Regular exercise:  Yes. 3-5 x weekly   Caffeine Use:  No   Married- lives with husband, 2 children (age 63 and 43).   Disabled- due to deafness and overall work. Couldn't keep up.  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          Past Surgical History  Procedure Laterality Date  . Cholecystectomy  2011  . Breast surgery  2011    biposy--benign  . Appendectomy  1986  . Tonsillectomy and adenoidectomy  1973  . Abdominal hysterectomy  2000  . Ovarian cyst removal  1986, 1988  . Upper gastrointestinal endoscopy  2009    Gets them every 3 years  . Cataract extraction  10/13/11    right eye    Family History  Problem Relation Age of Onset  . Kidney disease Mother   . Multiple sclerosis Mother   . Other Mother     polio  . Cancer Father     colon  . Hyperlipidemia Father   . Heart disease Father   . Hypertension Father   . Diabetes Maternal Aunt   . Heart disease Maternal Grandmother   . Hypertension Maternal Grandmother   . Diabetes Maternal Grandmother   . Heart disease Maternal Grandfather   . Hypertension Maternal Grandfather     Allergies  Allergen Reactions  . Aspirin Other (See Comments)    Stomach pains  . Codeine Other (See Comments)    Nosebleed  . Eggs Or Egg-Derived Products     Pt reports allergy to RAW EGGS. ? Stomach upset, diarrhea  . Erythromycin Nausea And Vomiting  . Fluticasone Propionate     Stomach upset  . Penicillins Hives  . Prilosec [Omeprazole Magnesium]  migraine  . Vaseline [Petrolatum] Itching  . Vicodin [Hydrocodone-Acetaminophen]     twitching  . Zetia [Ezetimibe] Diarrhea    Current Outpatient Prescriptions on File Prior to Visit  Medication Sig Dispense Refill  . Calcium Carbonate-Vitamin D (CALTRATE 600+D) 600-400 MG-UNIT per tablet Take 1 tablet by mouth daily.        Marland Kitchen estradiol (ESTRACE) 1 MG tablet Take 1 tablet (1 mg total) by mouth daily.  90 tablet  0  . glucose blood (ONE TOUCH ULTRA TEST) test strip Use to check blood sugar every morning.  100 each  2  . JANUMET XR 50-1000 MG TB24 TAKE ONE TABLET BY MOUTH ONCE DAILY  90 tablet  1  . lisinopril (PRINIVIL,ZESTRIL) 5 MG tablet Take 1 tablet (5 mg total) by mouth daily. For kidney protection.  30 tablet  2  . pantoprazole (PROTONIX) 40 MG tablet Take 40 mg by mouth daily.        . Pitavastatin Calcium (LIVALO) 4 MG TABS Take 1 tablet (4 mg total) by mouth daily.  90 tablet  1  . Probiotic Product (PROBIOTIC DAILY PO) Take 1 tablet by mouth daily.       No current facility-administered  medications on file prior to visit.    BP 120/70  Pulse 118  Temp(Src) 97.7 F (36.5 C) (Oral)  Resp 16  Ht 5' 0.25" (1.53 m)  Wt 115 lb 1.3 oz (52.2 kg)  BMI 22.3 kg/m2  SpO2 99%  LMP 04/28/1998       Objective:   Physical Exam  Constitutional: She is oriented to person, place, and time. She appears well-developed and well-nourished. No distress.  Cardiovascular: Normal rate and regular rhythm.   No murmur heard. Pulmonary/Chest: Effort normal and breath sounds normal. No respiratory distress. She has no wheezes. She has no rales. She exhibits no tenderness.  Musculoskeletal: She exhibits no edema.       Lumbar back: She exhibits tenderness.  Bilateral LE strength is 5/5  Neurological: She is alert and oriented to person, place, and time.  Psychiatric: She has a normal mood and affect. Her behavior is normal. Judgment and thought content normal.          Assessment & Plan:

## 2013-02-08 ENCOUNTER — Encounter: Payer: Self-pay | Admitting: Family

## 2013-02-08 ENCOUNTER — Ambulatory Visit (INDEPENDENT_AMBULATORY_CARE_PROVIDER_SITE_OTHER): Payer: Medicare Other | Admitting: Family

## 2013-02-08 VITALS — BP 120/70 | HR 91 | Temp 97.8°F | Resp 16 | Ht 60.25 in | Wt 116.1 lb

## 2013-02-08 DIAGNOSIS — N644 Mastodynia: Secondary | ICD-10-CM

## 2013-02-08 DIAGNOSIS — N6320 Unspecified lump in the left breast, unspecified quadrant: Secondary | ICD-10-CM | POA: Insufficient documentation

## 2013-02-08 MED ORDER — DOXYCYCLINE HYCLATE 100 MG PO TABS: 100.0000 mg | ORAL_TABLET | Freq: Two times a day (BID) | ORAL | Status: DC

## 2013-02-08 NOTE — Assessment & Plan Note (Addendum)
Pt is pen allergic. Rx empirically with doxycycline and close follow up.  Due for mammogram. Will plan to schedule next visit after we re-evaluate her pain.  A sign language interpreter was used during her visit.

## 2013-02-08 NOTE — Patient Instructions (Signed)
Start doxycycline, antibiotic. You may use tylenol as needed for pain. Call if you develop pain/redness, swelling of the left breast or if you develop fever. Follow up in 1 week.

## 2013-02-08 NOTE — Progress Notes (Signed)
Subjective:    Patient ID: Molly May, female    DOB: [birth year], 52 y.o.   MRN: [id number]  HPI  Ms. Kreh is a 52 yr old female who presents today with chief complaint of left sided breast pain.  Pain has been present x 1.5 weeks.  Pain is worse with movement such as when she hugs her children/husband.  Last mammogram was 2 years ago and she reports that she recently received a letter reminding her to follow up for follow up mammogram. Reports hx of cyst on the right side which occurred 7 years ago and was removed.  She also reports + numbness in her fingertips on the left side.      Review of Systems See HPI  Past Medical History  Diagnosis Date  . Depression   . Diabetes mellitus   . GERD (gastroesophageal reflux disease)   . Hypertension   . Heart murmur   . Hyperlipidemia   . Stomach ulcer 72yrs of age  . Kidney stone   . Migraine   . 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 Anxiety disorder 02/19/2011  . Mitral valve prolapse 02/19/2011  . Sleep apnea 02/19/2011    History   Social History  . Marital Status: Single    Spouse Name: N/A    Number of Children: N/A  . Years of Education: N/A   Occupational History  . Not on file.   Social History Main Topics  . Smoking status: Never Smoker   . Smokeless tobacco: Never Used  . Alcohol Use: No  . Drug Use: No  . Sexual Activity: Not on file   Other Topics Concern  . Not on file   Social History Narrative   Regular exercise:  Yes. 3-5 x weekly   Caffeine Use:  No   Married- lives with husband, 2 children (age 18 and 62).   Disabled- due to deafness and overall work. Couldn't keep up.  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          Past Surgical History  Procedure Laterality Date  . Cholecystectomy  2011  . Breast surgery  2011    biposy--benign  . Appendectomy  1986  . Tonsillectomy and adenoidectomy  1973  . Abdominal  hysterectomy  2000  . Ovarian cyst removal  1986, 1988  . Upper gastrointestinal endoscopy  2009    Gets them every 3 years  . Cataract extraction  10/13/11    right eye    Family History  Problem Relation Age of Onset  . Kidney disease Mother   . Multiple sclerosis Mother   . Other Mother     polio  . Cancer Father     colon  . Hyperlipidemia Father   . Heart disease Father   . Hypertension Father   . Diabetes Maternal Aunt   . Heart disease Maternal Grandmother   . Hypertension Maternal Grandmother   . Diabetes Maternal Grandmother   . Heart disease Maternal Grandfather   . Hypertension Maternal Grandfather     Allergies  Allergen Reactions  . Aspirin Other (See Comments)    Stomach pains  . Codeine Other (See Comments)    Nosebleed  . Eggs Or Egg-Derived Products     Pt reports allergy to RAW EGGS. ? Stomach upset, diarrhea  . Erythromycin Nausea And Vomiting  . Fluticasone Propionate     Stomach upset  . Penicillins  Hives  . Prilosec [Omeprazole Magnesium]     migraine  . Vaseline [Petrolatum] Itching  . Vicodin [Hydrocodone-Acetaminophen]     twitching  . Zetia [Ezetimibe] Diarrhea    Current Outpatient Prescriptions on File Prior to Visit  Medication Sig Dispense Refill  . Calcium Carbonate-Vitamin D (CALTRATE 600+D) 600-400 MG-UNIT per tablet Take 1 tablet by mouth daily.        Marland Kitchen estradiol (ESTRACE) 1 MG tablet Take 1 tablet (1 mg total) by mouth daily.  90 tablet  0  . glucose blood (ONE TOUCH ULTRA TEST) test strip Use to check blood sugar every morning.  100 each  2  . JANUMET XR 50-1000 MG TB24 TAKE ONE TABLET BY MOUTH ONCE DAILY  90 tablet  1  . lisinopril (PRINIVIL,ZESTRIL) 5 MG tablet Take 1 tablet (5 mg total) by mouth daily. For kidney protection.  30 tablet  2  . pantoprazole (PROTONIX) 40 MG tablet Take 40 mg by mouth daily.        . Pitavastatin Calcium (LIVALO) 4 MG TABS Take 1 tablet (4 mg total) by mouth daily.  90 tablet  1  . Probiotic  Product (PROBIOTIC DAILY PO) Take 1 tablet by mouth daily.       No current facility-administered medications on file prior to visit.    BP 120/70  Pulse 91  Temp(Src) 97.8 F (36.6 C) (Oral)  Resp 16  Ht 5' 0.25" (1.53 m)  Wt 116 lb 1.3 oz (52.654 kg)  BMI 22.49 kg/m2  SpO2 99%  LMP 04/28/1998       Objective:   Physical Exam  Constitutional: She is oriented to person, place, and time. She appears well-developed and well-nourished. No distress.  HENT:  Head: Normocephalic and atraumatic.  Cardiovascular: Normal rate and regular rhythm.   No murmur heard. Pulmonary/Chest: Effort normal and breath sounds normal. No respiratory distress. She has no wheezes. She has no rales. She exhibits no tenderness.  Genitourinary:  Breasts: Examined lying and sitting.  Right: Without masses, retractions, discharge or axillary adenopathy.  Left: Without masses, retractions, discharge or axillary adenopathy. + tenderness to palpation of left outer quadrant at 5 oclock. No palpable abnormality in this region   Neurological: She is alert and oriented to person, place, and time.  Psychiatric: She has a normal mood and affect. Her behavior is normal. Judgment and thought content normal.          Assessment & Plan:

## 2013-02-15 ENCOUNTER — Ambulatory Visit (INDEPENDENT_AMBULATORY_CARE_PROVIDER_SITE_OTHER): Payer: Medicare Other | Admitting: Family

## 2013-02-15 ENCOUNTER — Encounter: Payer: Self-pay | Admitting: Family

## 2013-02-15 VITALS — BP 110/70 | HR 97 | Temp 97.8°F | Resp 16 | Ht 60.25 in | Wt 116.0 lb

## 2013-02-15 DIAGNOSIS — N63 Unspecified lump in unspecified breast: Secondary | ICD-10-CM

## 2013-02-15 DIAGNOSIS — N644 Mastodynia: Secondary | ICD-10-CM

## 2013-02-15 DIAGNOSIS — N6320 Unspecified lump in the left breast, unspecified quadrant: Secondary | ICD-10-CM

## 2013-02-15 NOTE — Patient Instructions (Signed)
You will be contacted about your mammogram. Please follow up on 11/3 as scheduled.

## 2013-02-15 NOTE — Assessment & Plan Note (Addendum)
Today more discrete mass is palpable.  I suspect breast cyst. Will refer diagnostic mammo and ultrasound.

## 2013-02-15 NOTE — Progress Notes (Addendum)
Subjective:    Patient ID: Molly May, female    DOB: [birth year], 52 y.o.   MRN: [id number]  HPI  Ms. Fiallos is a 52 yr old female who presents today for follow up of her left breast pain. She was seen 1 week ago and was given rx for doxycycline. She reports that the pain is 7/10.  She reports that the left arm/hand pain is improved.  Denies fever, takes last dose of abx this AM.  She brings with her today a letter from [facility] center indicating htat last mammo was performed on 01/05/12.     Review of Systems See HPI  Past Medical History  Diagnosis Date  . Depression   . Diabetes mellitus   . GERD (gastroesophageal reflux disease)   . Hypertension   . Heart murmur   . Hyperlipidemia   . Stomach ulcer 53yrs of age  . Kidney stone   . Migraine   . 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 Anxiety disorder 02/19/2011  . Mitral valve prolapse 02/19/2011  . Sleep apnea 02/19/2011    History   Social History  . Marital Status: Single    Spouse Name: N/A    Number of Children: N/A  . Years of Education: N/A   Occupational History  . Not on file.   Social History Main Topics  . Smoking status: Never Smoker   . Smokeless tobacco: Never Used  . Alcohol Use: No  . Drug Use: No  . Sexual Activity: Not on file   Other Topics Concern  . Not on file   Social History Narrative   Regular exercise:  Yes. 3-5 x weekly   Caffeine Use:  No   Married- lives with husband, 2 children (age 28 and 74).   Disabled- due to deafness and overall work. Couldn't keep up.  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          Past Surgical History  Procedure Laterality Date  . Cholecystectomy  2011  . Breast surgery  2011    biposy--benign  . Appendectomy  1986  . Tonsillectomy and adenoidectomy  1973  . Abdominal hysterectomy  2000  . Ovarian cyst removal  1986, 1988  . Upper gastrointestinal endoscopy   2009    Gets them every 3 years  . Cataract extraction  10/13/11    right eye    Family History  Problem Relation Age of Onset  . Kidney disease Mother   . Multiple sclerosis Mother   . Other Mother     polio  . Cancer Father     colon  . Hyperlipidemia Father   . Heart disease Father   . Hypertension Father   . Diabetes Maternal Aunt   . Heart disease Maternal Grandmother   . Hypertension Maternal Grandmother   . Diabetes Maternal Grandmother   . Heart disease Maternal Grandfather   . Hypertension Maternal Grandfather     Allergies  Allergen Reactions  . Aspirin Other (See Comments)    Stomach pains  . Codeine Other (See Comments)    Nosebleed  . Eggs Or Egg-Derived Products     Pt reports allergy to RAW EGGS. ? Stomach upset, diarrhea  . Erythromycin Nausea And Vomiting  . Fluticasone Propionate     Stomach upset  . Penicillins Hives  . Prilosec [Omeprazole Magnesium]     migraine  . Vaseline [Petrolatum] Itching  .  Vicodin [Hydrocodone-Acetaminophen]     twitching  . Zetia [Ezetimibe] Diarrhea    Current Outpatient Prescriptions on File Prior to Visit  Medication Sig Dispense Refill  . Calcium Carbonate-Vitamin D (CALTRATE 600+D) 600-400 MG-UNIT per tablet Take 1 tablet by mouth daily.        Marland Kitchen doxycycline (VIBRA-TABS) 100 MG tablet Take 1 tablet (100 mg total) by mouth 2 (two) times daily.  20 tablet  0  . estradiol (ESTRACE) 1 MG tablet Take 1 tablet (1 mg total) by mouth daily.  90 tablet  0  . glucose blood (ONE TOUCH ULTRA TEST) test strip Use to check blood sugar every morning.  100 each  2  . JANUMET XR 50-1000 MG TB24 TAKE ONE TABLET BY MOUTH ONCE DAILY  90 tablet  1  . lisinopril (PRINIVIL,ZESTRIL) 5 MG tablet Take 1 tablet (5 mg total) by mouth daily. For kidney protection.  30 tablet  2  . pantoprazole (PROTONIX) 40 MG tablet Take 40 mg by mouth daily.        . Pitavastatin Calcium (LIVALO) 4 MG TABS Take 1 tablet (4 mg total) by mouth daily.  90  tablet  1  . Probiotic Product (PROBIOTIC DAILY PO) Take 1 tablet by mouth daily.       No current facility-administered medications on file prior to visit.    BP 110/70  Pulse 97  Temp(Src) 97.8 F (36.6 C) (Oral)  Resp 16  Ht 5' 0.25" (1.53 m)  Wt 116 lb (52.617 kg)  BMI 22.48 kg/m2  SpO2 99%  LMP 04/28/1998       Objective:   Physical Exam  Constitutional: She is oriented to person, place, and time. She appears well-developed and well-nourished. No distress.  HENT:  Head: Normocephalic and atraumatic.  Cardiovascular: Normal rate and regular rhythm.   No murmur heard. Pulmonary/Chest: Effort normal and breath sounds normal. No respiratory distress. She has no wheezes. She has no rales. She exhibits no tenderness.  Genitourinary:  Firm, tender rubbery mobile approx 1 cm mass noted left lateral breast around 3 oclock.  Musculoskeletal: She exhibits no edema.  Lymphadenopathy:    She has no cervical adenopathy.  Neurological: She is alert and oriented to person, place, and time.  Psychiatric: She has a normal mood and affect. Her behavior is normal. Judgment and thought content normal.          Assessment & Plan:  Sign language interpreter is used for this visit.

## 2013-02-22 ENCOUNTER — Telehealth: Payer: Self-pay | Admitting: Family

## 2013-02-22 NOTE — Telephone Encounter (Signed)
See my chart message

## 2013-02-28 ENCOUNTER — Encounter: Payer: Self-pay | Admitting: Family

## 2013-02-28 ENCOUNTER — Ambulatory Visit (INDEPENDENT_AMBULATORY_CARE_PROVIDER_SITE_OTHER): Payer: Medicare Other | Admitting: Family

## 2013-02-28 VITALS — BP 142/86 | HR 92 | Temp 97.8°F | Resp 16 | Ht 60.25 in | Wt 117.0 lb

## 2013-02-28 DIAGNOSIS — N6320 Unspecified lump in the left breast, unspecified quadrant: Secondary | ICD-10-CM

## 2013-02-28 DIAGNOSIS — E785 Hyperlipidemia, unspecified: Secondary | ICD-10-CM

## 2013-02-28 DIAGNOSIS — E119 Type 2 diabetes mellitus without complications: Secondary | ICD-10-CM

## 2013-02-28 DIAGNOSIS — M5430 Sciatica, unspecified side: Secondary | ICD-10-CM

## 2013-02-28 DIAGNOSIS — N63 Unspecified lump in unspecified breast: Secondary | ICD-10-CM

## 2013-02-28 LAB — HEMOGLOBIN A1C: Mean Plasma Glucose: 166 mg/dL — ABNORMAL HIGH (ref <117)

## 2013-02-28 LAB — BASIC METABOLIC PANEL
CO2: 28 mEq/L (ref 19–32)
Calcium: 9.7 mg/dL (ref 8.4–10.5)
Chloride: 99 mEq/L (ref 96–112)
Creat: 0.64 mg/dL (ref 0.50–1.10)

## 2013-02-28 MED ORDER — ESTRADIOL 1 MG PO TABS: 1.0000 mg | ORAL_TABLET | Freq: Every day | ORAL | Status: DC

## 2013-02-28 NOTE — Assessment & Plan Note (Signed)
For upcoming breast imaging.

## 2013-02-28 NOTE — Progress Notes (Signed)
Subjective:    Patient ID: Molly May, female    DOB: 1961-03-10, 52 y.o.   MRN: [id number]  HPI Molly May is a 52 year old female who presents today for follow up.  1) Diabetes: Patient reports she is checking her blood sugars every 2 hours and had recently been running very high with symptoms of (polyuria and polydipsia) and low while taking her medication, patient now reports to be running 115's. Denies symptoms of polyuria and polydipsia today.  2) Hip Pain: Patient has completed steriods and reports pain is much better.  3) Breast Mass: Patient reports she has an appointment this coming Wednesday morning for breast imaging.    Review of Systems  HENT: Positive for sneezing. Negative for sore throat.        Reports allergies.  Respiratory: Negative for cough, chest tightness and shortness of breath.   Cardiovascular: Negative for chest pain.  Gastrointestinal: Negative for abdominal pain.  Endocrine: Negative for polydipsia, polyphagia and polyuria.  Genitourinary: Negative for dysuria.  Neurological: Negative for dizziness and headaches.   Past Medical History  Diagnosis Date  . Depression   . Diabetes mellitus   . GERD (gastroesophageal reflux disease)   . Hypertension   . Heart murmur   . Hyperlipidemia   . Stomach ulcer 77yrs of age  . Kidney stone   . Migraine   . 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 Anxiety disorder 02/19/2011  . Mitral valve prolapse 02/19/2011  . Sleep apnea 02/19/2011    History   Social History  . Marital Status: Single    Spouse Name: N/A    Number of Children: N/A  . Years of Education: N/A   Occupational History  . Not on file.   Social History Main Topics  . Smoking status: Never Smoker   . Smokeless tobacco: Never Used  . Alcohol Use: No  . Drug Use: No  . Sexual Activity: Not on file   Other Topics Concern  . Not on file   Social History Narrative   Regular exercise:  Yes. 3-5 x weekly   Caffeine Use:  No   Married- lives with husband, 2 children (age 54 and 61).   Disabled- due to deafness and overall work. Couldn't keep up.  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          Past Surgical History  Procedure Laterality Date  . Cholecystectomy  2011  . Breast surgery  2011    biposy--benign  . Appendectomy  1986  . Tonsillectomy and adenoidectomy  1973  . Abdominal hysterectomy  2000  . Ovarian cyst removal  1986, 1988  . Upper gastrointestinal endoscopy  2009    Gets them every 3 years  . Cataract extraction  10/13/11    right eye    Family History  Problem Relation Age of Onset  . Kidney disease Mother   . Multiple sclerosis Mother   . Other Mother     polio  . Cancer Father     colon  . Hyperlipidemia Father   . Heart disease Father   . Hypertension Father   . Diabetes Maternal Aunt   . Heart disease Maternal Grandmother   . Hypertension Maternal Grandmother   . Diabetes Maternal Grandmother   . Heart disease Maternal Grandfather   . Hypertension Maternal Grandfather     Allergies  Allergen Reactions  . Aspirin Other (See  Comments)    Stomach pains  . Codeine Other (See Comments)    Nosebleed  . Eggs Or Egg-Derived Products     Pt reports allergy to RAW EGGS. ? Stomach upset, diarrhea  . Erythromycin Nausea And Vomiting  . Fluticasone Propionate     Stomach upset  . Penicillins Hives  . Prilosec [Omeprazole Magnesium]     migraine  . Vaseline [Petrolatum] Itching  . Vicodin [Hydrocodone-Acetaminophen]     twitching  . Zetia [Ezetimibe] Diarrhea    Current Outpatient Prescriptions on File Prior to Visit  Medication Sig Dispense Refill  . Calcium Carbonate-Vitamin D (CALTRATE 600+D) 600-400 MG-UNIT per tablet Take 1 tablet by mouth daily.        Marland Kitchen glucose blood (ONE TOUCH ULTRA TEST) test strip Use to check blood sugar every morning.  100 each  2  . JANUMET XR 50-1000 MG TB24 TAKE ONE  TABLET BY MOUTH ONCE DAILY  90 tablet  1  . lisinopril (PRINIVIL,ZESTRIL) 5 MG tablet Take 1 tablet (5 mg total) by mouth daily. For kidney protection.  30 tablet  2  . pantoprazole (PROTONIX) 40 MG tablet Take 40 mg by mouth daily.        . Pitavastatin Calcium (LIVALO) 4 MG TABS Take 1 tablet (4 mg total) by mouth daily.  90 tablet  1  . Probiotic Product (PROBIOTIC DAILY PO) Take 1 tablet by mouth daily.       No current facility-administered medications on file prior to visit.    BP 142/86  Pulse 92  Temp(Src) 97.8 F (36.6 C) (Oral)  Resp 16  Ht 5' 0.25" (1.53 m)  Wt 117 lb (53.071 kg)  BMI 22.67 kg/m2  SpO2 94%  LMP 04/28/1998       Objective:   Physical Exam  Constitutional: She is oriented to person, place, and time. She appears well-nourished.  HENT:  Mouth/Throat: Oropharynx is clear and moist.  Eyes: Pupils are equal, round, and reactive to light.  Neck: Neck supple.  Cardiovascular: Normal rate, regular rhythm and normal heart sounds.   No murmur heard. Pulmonary/Chest: Effort normal and breath sounds normal. No respiratory distress. She has no wheezes.  Lymphadenopathy:    She has no cervical adenopathy.  Neurological: She is alert and oriented to person, place, and time.  Skin: Skin is warm and dry. No rash noted.  Psychiatric: She has a normal mood and affect.          Assessment & Plan:  A sign language interpreter is present for today's visit.

## 2013-02-28 NOTE — Assessment & Plan Note (Addendum)
Check A1C, check bmet, eye exam up to date. Cannot take flu shot due to allergy. Continue janumet, ace

## 2013-02-28 NOTE — Assessment & Plan Note (Signed)
LDL at goal.  Continue livalo.

## 2013-02-28 NOTE — Patient Instructions (Signed)
Please complete lab work prior to leaving. Try binging coupon and rx to [facility] to see if it is cheaper. We will contact you with your results for breast imaging. Follow up in 3 months.

## 2013-02-28 NOTE — Assessment & Plan Note (Signed)
Resolved following steroid rx.  Monitor.

## 2013-03-01 ENCOUNTER — Telehealth: Payer: Self-pay | Admitting: Family

## 2013-03-01 ENCOUNTER — Encounter: Payer: Self-pay | Admitting: Family

## 2013-03-01 NOTE — Telephone Encounter (Signed)
See my chart message

## 2013-03-02 ENCOUNTER — Encounter: Payer: Self-pay | Admitting: Family

## 2013-03-04 ENCOUNTER — Telehealth: Payer: Self-pay | Admitting: *Deleted

## 2013-03-04 NOTE — Telephone Encounter (Signed)
Eye exam documented.  

## 2013-03-04 NOTE — Telephone Encounter (Signed)
Message copied by Kathi Simpers on Fri Mar 04, 2013  4:06 PM ------      Message from: O'SULLIVAN, MELISSA      Created: Mon Feb 28, 2013  8:57 AM       Pt had eye exam 12/27/12- neg retinopathy per pt ------

## 2013-04-05 ENCOUNTER — Encounter: Payer: Self-pay | Admitting: Family Medicine

## 2013-05-05 ENCOUNTER — Other Ambulatory Visit: Payer: Self-pay | Admitting: Family

## 2013-05-13 ENCOUNTER — Telehealth: Payer: Self-pay | Admitting: Family

## 2013-05-13 MED ORDER — LISINOPRIL 5 MG PO TABS: 5.0000 mg | ORAL_TABLET | Freq: Every day | ORAL | Status: DC

## 2013-05-13 NOTE — Telephone Encounter (Signed)
Refill sent.

## 2013-05-13 NOTE — Telephone Encounter (Signed)
Refill- lisinopril 5mg  tab. Take one tablet by mouth once daily. Qty 30 last fill 12.10.14

## 2013-05-23 ENCOUNTER — Telehealth: Payer: Self-pay | Admitting: *Deleted

## 2013-05-23 ENCOUNTER — Other Ambulatory Visit: Payer: Self-pay | Admitting: Family

## 2013-05-23 MED ORDER — LISINOPRIL 5 MG PO TABS: 5.0000 mg | ORAL_TABLET | Freq: Every day | ORAL | Status: DC

## 2013-05-23 NOTE — Telephone Encounter (Signed)
Received message from [facility] that they have requested refill of lisinopril twice with no response.  Spoke with pharmacist, she has no record of our refill authorization from 05/13/13. Gave verbal, #30 x 2 refills.

## 2013-05-24 ENCOUNTER — Other Ambulatory Visit: Payer: Self-pay | Admitting: Family

## 2013-05-31 ENCOUNTER — Ambulatory Visit (INDEPENDENT_AMBULATORY_CARE_PROVIDER_SITE_OTHER): Payer: Medicare Other | Admitting: Family

## 2013-05-31 ENCOUNTER — Encounter: Payer: Self-pay | Admitting: Family

## 2013-05-31 VITALS — BP 110/80 | HR 91 | Temp 97.3°F | Ht 60.25 in | Wt 115.1 lb

## 2013-05-31 DIAGNOSIS — N6320 Unspecified lump in the left breast, unspecified quadrant: Secondary | ICD-10-CM

## 2013-05-31 DIAGNOSIS — E119 Type 2 diabetes mellitus without complications: Secondary | ICD-10-CM

## 2013-05-31 DIAGNOSIS — G4700 Insomnia, unspecified: Secondary | ICD-10-CM

## 2013-05-31 DIAGNOSIS — E785 Hyperlipidemia, unspecified: Secondary | ICD-10-CM

## 2013-05-31 DIAGNOSIS — N63 Unspecified lump in unspecified breast: Secondary | ICD-10-CM

## 2013-05-31 LAB — LIPID PANEL
Cholesterol: 162 mg/dL (ref 0–200)
HDL: 71 mg/dL (ref >39)
LDL CALC: 56 mg/dL (ref 0–99)
Total CHOL/HDL Ratio: 2.3 Ratio
Triglycerides: 177 mg/dL — ABNORMAL HIGH (ref <150)
VLDL: 35 mg/dL (ref 0–40)

## 2013-05-31 LAB — BASIC METABOLIC PANEL WITH GFR
BUN: 19 mg/dL (ref 6–23)
CHLORIDE: 98 meq/L (ref 96–112)
CO2: 27 mEq/L (ref 19–32)
CREATININE: 0.66 mg/dL (ref 0.50–1.10)
Calcium: 10.1 mg/dL (ref 8.4–10.5)
GFR, Est African American: >89 mL/min
GLUCOSE: 140 mg/dL — AB (ref 70–99)
POTASSIUM: 4 meq/L (ref 3.5–5.3)
Sodium: 135 mEq/L (ref 135–145)

## 2013-05-31 LAB — HEPATIC FUNCTION PANEL
ALBUMIN: 4.7 g/dL (ref 3.5–5.2)
ALK PHOS: 52 U/L (ref 39–117)
ALT: 11 U/L (ref 0–35)
AST: 15 U/L (ref 0–37)
BILIRUBIN INDIRECT: 0.4 mg/dL (ref 0.2–1.2)
Bilirubin, Direct: 0.1 mg/dL (ref 0.0–0.3)
Total Bilirubin: 0.5 mg/dL (ref 0.2–1.2)
Total Protein: 7.6 g/dL (ref 6.0–8.3)

## 2013-05-31 LAB — HEMOGLOBIN A1C
HEMOGLOBIN A1C: 7.1 % — AB (ref <5.7)
MEAN PLASMA GLUCOSE: 157 mg/dL — AB (ref <117)

## 2013-05-31 MED ORDER — ESTRADIOL 1 MG PO TABS: ORAL_TABLET | ORAL | Status: DC

## 2013-05-31 NOTE — Progress Notes (Signed)
Pre visit review using our clinic review tool, if applicable. No additional management support is needed unless otherwise documented below in the visit note. 

## 2013-05-31 NOTE — Assessment & Plan Note (Signed)
Sugars running high, obtain A1C.  If above 7 consider adding another oral agent.

## 2013-05-31 NOTE — Assessment & Plan Note (Signed)
Pt reports negative breast imaging. Will request results form [facility].

## 2013-05-31 NOTE — Progress Notes (Signed)
Subjective:    Patient ID: Molly May, female    DOB: 08/16/1960, 53 y.o.   MRN: [id number]  HPI  Molly May is a 53 yr old female who presents today for follow up. She is accompanied  By a sign language interpretor.    1) DM2- maintained on janumet, ACE. She is allergic to aspirin. She reports her sugar this AM was 108.    2) L breast mass- ultrasound and mammogram was ordered 10/21.  She reports that she completed imaging and was told that it was negatie.   3) Hyperlipidemia- Maintained on livalo.  Last lipid panel was 1/14: Lab Results  Component Value Date   CHOL 160 05/07/2012   HDL 69 05/07/2012   LDLCALC 58 05/07/2012   TRIG 163* 05/07/2012   CHOLHDL 2.3 05/07/2012  She reports tolerating livalo without difficulty.  4) reports that she wakes up randomly at night at night.  Reports that her husband's schedule has drastically changed.     Review of Systems See HPI  Past Medical History  Diagnosis Date  . Depression   . Diabetes mellitus   . GERD (gastroesophageal reflux disease)   . Hypertension   . Heart murmur   . Hyperlipidemia   . Stomach ulcer [age]yrs of age  . Kidney stone   . Migraine   . 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 Anxiety disorder 02/19/2011  . Mitral valve prolapse 02/19/2011  . Sleep apnea 02/19/2011    History   Social History  . Marital Status: Single    Spouse Name: N/A    Number of Children: N/A  . Years of Education: N/A   Occupational History  . Not on file.   Social History Main Topics  . Smoking status: Passive Smoke Exposure - Never Smoker  . Smokeless tobacco: Never Used  . Alcohol Use: No  . Drug Use: No  . Sexual Activity: Not on file   Other Topics Concern  . Not on file   Social History Narrative   Regular exercise:  Yes. 3-5 x weekly   Caffeine Use:  No   Married- lives with husband, 2 children (age 297 and 288).   Disabled- due to deafness and overall work. Couldn't keep up.  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          Past Surgical History  Procedure Laterality Date  . Cholecystectomy  2011  . Breast surgery  2011    biposy--benign  . Appendectomy  1986  . Tonsillectomy and adenoidectomy  1973  . Abdominal hysterectomy  2000  . Ovarian cyst removal  1986, 1988  . Upper gastrointestinal endoscopy  2009    Gets them every 3 years  . Cataract extraction  10/13/11    right eye    Family History  Problem Relation Age of Onset  . Kidney disease Mother   . Multiple sclerosis Mother   . Other Mother     polio  . Cancer Father     colon  . Hyperlipidemia Father   . Heart disease Father   . Hypertension Father   . Diabetes Maternal Aunt   . Heart disease Maternal Grandmother   . Hypertension Maternal Grandmother   . Diabetes Maternal Grandmother   . Heart disease Maternal Grandfather   . Hypertension Maternal Grandfather     Allergies  Allergen Reactions  . Aspirin Other (See Comments)  Stomach pains  . Codeine Other (See Comments)    Nosebleed  . Eggs Or Egg-Derived Products     Pt reports allergy to RAW EGGS. ? Stomach upset, diarrhea  . Erythromycin Nausea And Vomiting  . Fluticasone Propionate     Stomach upset  . Penicillins Hives  . Prilosec [Omeprazole Magnesium]     migraine  . Vaseline [Petrolatum] Itching  . Vicodin [Hydrocodone-Acetaminophen]     twitching  . Zetia [Ezetimibe] Diarrhea    Current Outpatient Prescriptions on File Prior to Visit  Medication Sig Dispense Refill  . Calcium Carbonate-Vitamin D (CALTRATE 600+D) 600-400 MG-UNIT per tablet Take 1 tablet by mouth daily.        Marland Kitchen estradiol (ESTRACE) 1 MG tablet TAKE ONE TABLET BY MOUTH EVERY DAY  30 tablet  2  . glucose blood (ONE TOUCH ULTRA TEST) test strip Use to check blood sugar every morning.  100 each  2  . lisinopril (PRINIVIL,ZESTRIL) 5 MG tablet Take 1 tablet (5 mg total) by mouth daily. For kidney  protection.  30 tablet  2  . LIVALO 4 MG TABS TAKE ONE TABLET BY MOUTH ONCE DAILY  90 tablet  0  . pantoprazole (PROTONIX) 40 MG tablet Take 40 mg by mouth daily.        . Probiotic Product (PROBIOTIC DAILY PO) Take 1 tablet by mouth daily.      . SitaGLIPtin-MetFORMIN HCl (JANUMET XR) 50-1000 MG TB24 take 2 tabs by mouth once daily       No current facility-administered medications on file prior to visit.    BP 110/80  Pulse 91  Temp(Src) 97.3 F (36.3 C) (Oral)  Ht 5' 0.25" (1.53 m)  Wt 115 lb 1.9 oz (52.218 kg)  BMI 22.31 kg/m2  SpO2 97%  LMP 04/28/1998       Objective:   Physical Exam  Constitutional: She is oriented to person, place, and time. She appears well-developed and well-nourished. No distress.  Cardiovascular: Normal rate and regular rhythm.   No murmur heard. Pulmonary/Chest: Effort normal and breath sounds normal. No respiratory distress. She has no wheezes. She has no rales. She exhibits no tenderness.  Neurological: She is alert and oriented to person, place, and time.  Psychiatric: She has a normal mood and affect. Her behavior is normal. Judgment and thought content normal.          Assessment & Plan:

## 2013-05-31 NOTE — Assessment & Plan Note (Signed)
Trial of melatonin.  

## 2013-05-31 NOTE — Assessment & Plan Note (Signed)
Tolerating statin, obtain flp/lft. Continue livalo.

## 2013-05-31 NOTE — Patient Instructions (Addendum)
You can try melatonin 3 mg one hour before bed for sleep. Complete lab work prior to leaving.  Please follow up in 3 months.

## 2013-06-02 ENCOUNTER — Encounter: Payer: Self-pay | Admitting: Family

## 2013-06-03 ENCOUNTER — Telehealth: Payer: Self-pay

## 2013-06-03 NOTE — Telephone Encounter (Signed)
Relevant patient education assigned to patient using Emmi. ° °

## 2013-06-29 ENCOUNTER — Encounter: Payer: Self-pay | Admitting: Family

## 2013-06-30 MED ORDER — CANAGLIFLOZIN 100 MG PO TABS: ORAL_TABLET | ORAL | Status: DC

## 2013-07-31 ENCOUNTER — Other Ambulatory Visit: Payer: Self-pay | Admitting: Family

## 2013-08-10 NOTE — Telephone Encounter (Signed)
Opened in error

## 2013-08-25 ENCOUNTER — Other Ambulatory Visit: Payer: Self-pay | Admitting: Family

## 2013-08-26 ENCOUNTER — Encounter: Payer: Self-pay | Admitting: Family

## 2013-09-02 ENCOUNTER — Encounter: Payer: Self-pay | Admitting: Family

## 2013-09-02 ENCOUNTER — Ambulatory Visit (INDEPENDENT_AMBULATORY_CARE_PROVIDER_SITE_OTHER): Payer: Medicare Other | Admitting: Family

## 2013-09-02 VITALS — BP 110/70 | HR 83 | Temp 97.8°F | Resp 16 | Ht 63.0 in | Wt 112.0 lb

## 2013-09-02 DIAGNOSIS — R82998 Other abnormal findings in urine: Secondary | ICD-10-CM

## 2013-09-02 DIAGNOSIS — E119 Type 2 diabetes mellitus without complications: Secondary | ICD-10-CM

## 2013-09-02 DIAGNOSIS — R8290 Unspecified abnormal findings in urine: Secondary | ICD-10-CM

## 2013-09-02 LAB — BASIC METABOLIC PANEL WITH GFR
BUN: 15 mg/dL (ref 6–23)
CHLORIDE: 99 meq/L (ref 96–112)
CO2: 27 mEq/L (ref 19–32)
Calcium: 9.2 mg/dL (ref 8.4–10.5)
Creat: 0.63 mg/dL (ref 0.50–1.10)
Glucose, Bld: 123 mg/dL — ABNORMAL HIGH (ref 70–99)
POTASSIUM: 4 meq/L (ref 3.5–5.3)
SODIUM: 136 meq/L (ref 135–145)

## 2013-09-02 LAB — POCT URINALYSIS DIPSTICK
Blood, UA: NEGATIVE
Glucose, UA: NEGATIVE
KETONES UA: 5
LEUKOCYTES UA: NEGATIVE
Nitrite, UA: NEGATIVE
PROTEIN UA: NEGATIVE
Spec Grav, UA: 1.025
Urobilinogen, UA: 0.2
pH, UA: 6

## 2013-09-02 LAB — HEMOGLOBIN A1C
Hgb A1c MFr Bld: 6.7 % — ABNORMAL HIGH (ref <5.7)
Mean Plasma Glucose: 146 mg/dL — ABNORMAL HIGH (ref <117)

## 2013-09-02 MED ORDER — PIOGLITAZONE HCL 30 MG PO TABS: 30.0000 mg | ORAL_TABLET | Freq: Every day | ORAL | Status: DC

## 2013-09-02 MED ORDER — LISINOPRIL 5 MG PO TABS: 5.0000 mg | ORAL_TABLET | Freq: Every day | ORAL | Status: DC

## 2013-09-02 NOTE — Patient Instructions (Signed)
Please complete your lab work prior to leaving. Start actos once daily for your blood sugar. Follow up in 3 months.

## 2013-09-02 NOTE — Progress Notes (Signed)
Pre visit review using our clinic review tool, if applicable. No additional management support is needed unless otherwise documented below in the visit note. 

## 2013-09-02 NOTE — Progress Notes (Signed)
Subjective:    Patient ID: Molly May, female    DOB: 05/09/1960, 53 y.o.   MRN: [id number]  HPI  [name] is a 53 yr old female who presents today for follow up.  1) DM2- was on invokana but developed yeast infections and anorexia. She discontinued invokana.  She is currently maintained on janumet xr. Last A1C was 7.1.    2) Hyperlipidemia-currently maintained on livalo.  Last LDL was at goal.    Lab Results  Component Value Date   CHOL 162 05/31/2013   HDL 71 05/31/2013   LDLCALC 56 05/31/2013   TRIG 177* 05/31/2013   CHOLHDL 2.3 05/31/2013        Review of Systems    see HPI  Past Medical History  Diagnosis Date  . Depression   . Diabetes mellitus   . GERD (gastroesophageal reflux disease)   . Hypertension   . Heart murmur   . Hyperlipidemia   . Stomach ulcer 53yrs of age  . Kidney stone   . Migraine   . 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 Anxiety disorder 02/19/2011  . Mitral valve prolapse 02/19/2011  . Sleep apnea 02/19/2011    History   Social History  . Marital Status: Single    Spouse Name: N/A    Number of Children: N/A  . Years of Education: N/A   Occupational History  . Not on file.   Social History Main Topics  . Smoking status: Passive Smoke Exposure - Never Smoker  . Smokeless tobacco: Never Used  . Alcohol Use: No  . Drug Use: No  . Sexual Activity: Not on file   Other Topics Concern  . Not on file   Social History Narrative   Regular exercise:  Yes. 3-5 x weekly   Caffeine Use:  No   Married- lives with husband, 2 children (age 697 and 168).   Disabled- due to deafness and overall work. Couldn't keep up.  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          Past Surgical History  Procedure Laterality Date  . Cholecystectomy  2011  . Breast surgery  2011    biposy--benign  . Appendectomy  1986  . Tonsillectomy and adenoidectomy  1973  . Abdominal  hysterectomy  2000  . Ovarian cyst removal  1986, 1988  . Upper gastrointestinal endoscopy  2009    Gets them every 3 years  . Cataract extraction  10/13/11    right eye    Family History  Problem Relation Age of Onset  . Kidney disease Mother   . Multiple sclerosis Mother   . Other Mother     polio  . Cancer Father     colon  . Hyperlipidemia Father   . Heart disease Father   . Hypertension Father   . Diabetes Maternal Aunt   . Heart disease Maternal Grandmother   . Hypertension Maternal Grandmother   . Diabetes Maternal Grandmother   . Heart disease Maternal Grandfather   . Hypertension Maternal Grandfather     Allergies  Allergen Reactions  . Aspirin Other (See Comments)    Stomach pains  . Codeine Other (See Comments)    Nosebleed  . Eggs Or Egg-Derived Products     Pt reports allergy to RAW EGGS. ? Stomach upset, diarrhea  . Erythromycin Nausea And Vomiting  . Fluticasone Propionate     Stomach  upset  . Penicillins Hives  . Prilosec [Omeprazole Magnesium]     migraine  . Vaseline [Petrolatum] Itching  . Vicodin [Hydrocodone-Acetaminophen]     twitching  . Zetia [Ezetimibe] Diarrhea    Current Outpatient Prescriptions on File Prior to Visit  Medication Sig Dispense Refill  . Calcium Carbonate-Vitamin D (CALTRATE 600+D) 600-400 MG-UNIT per tablet Take 1 tablet by mouth daily.        Marland Kitchen. estradiol (ESTRACE) 1 MG tablet TAKE ONE TABLET BY MOUTH EVERY DAY  30 tablet  2  . glucose blood (ONE TOUCH ULTRA TEST) test strip Use to check blood sugar every morning.  100 each  2  . JANUMET XR 50-1000 MG TB24 TAKE ONE TABLET BY MOUTH ONCE DAILY  90 tablet  1  . lisinopril (PRINIVIL,ZESTRIL) 5 MG tablet Take 1 tablet (5 mg total) by mouth daily. For kidney protection.  30 tablet  2  . LIVALO 4 MG TABS TAKE ONE TABLET BY MOUTH ONCE DAILY  90 tablet  0  . pantoprazole (PROTONIX) 40 MG tablet Take 40 mg by mouth daily.        . Probiotic Product (PROBIOTIC DAILY PO) Take 1  tablet by mouth daily.      . Canagliflozin 100 MG TABS 1/2 tab by mouth once daily. Increase to full tab once daily as tolerated  30 tablet  2   No current facility-administered medications on file prior to visit.    BP 110/70  Pulse 83  Temp(Src) 97.8 F (36.6 C) (Oral)  Resp 16  Ht 5\' 3"  (1.6 m)  Wt 112 lb (50.803 kg)  BMI 19.84 kg/m2  SpO2 99%  LMP 04/28/1998    Objective:   Physical Exam  Constitutional: She is oriented to person, place, and time. She appears well-developed and well-nourished. No distress.  Cardiovascular: Normal rate and regular rhythm.   No murmur heard. Pulmonary/Chest: Effort normal and breath sounds normal. No respiratory distress. She has no wheezes. She has no rales. She exhibits no tenderness.  Musculoskeletal: She exhibits no edema.  Neurological: She is alert and oriented to person, place, and time.  Psychiatric: She has a normal mood and affect. Her behavior is normal. Judgment and thought content normal.          Assessment & Plan:

## 2013-09-03 ENCOUNTER — Encounter: Payer: Self-pay | Admitting: Family

## 2013-09-03 NOTE — Assessment & Plan Note (Signed)
Clinically stable, obtain A1C, remain off invokana, start actos, continue janumet xr.

## 2013-09-08 ENCOUNTER — Other Ambulatory Visit: Payer: Self-pay | Admitting: Family

## 2013-09-08 ENCOUNTER — Telehealth: Payer: Self-pay | Admitting: Family

## 2013-09-08 NOTE — Telephone Encounter (Signed)
[facility] [place] left message stating they never received the refill on 09/02/13 they only received the denial from today. Requesting the refill be sent back in since they never filled the patients Lisinopril on 09/02/13, call Merla RichesMary Grace at [phone] with questions

## 2013-09-08 NOTE — Telephone Encounter (Signed)
Medication Detail      Disp Refills Start End     lisinopril (PRINIVIL,ZESTRIL) 5 MG tablet 30 tablet 2 09/02/2013     Sig - Route: Take 1 tablet (5 mg total) by mouth daily. For kidney protection. - Oral    Class: Phone In    Pharmacy    [id number] - [place], [place] - [address]    Rx request Denied, Too Soon for request/SLS

## 2013-09-09 MED ORDER — LISINOPRIL 5 MG PO TABS: 5.0000 mg | ORAL_TABLET | Freq: Every day | ORAL | Status: DC

## 2013-09-09 NOTE — Telephone Encounter (Signed)
Rx resent.

## 2013-09-21 ENCOUNTER — Telehealth: Payer: Self-pay

## 2013-09-21 NOTE — Telephone Encounter (Signed)
Relevant patient education assigned to patient using Emmi. ° °

## 2013-10-27 ENCOUNTER — Encounter: Payer: Self-pay | Admitting: Physician Assistant

## 2013-10-27 ENCOUNTER — Ambulatory Visit (HOSPITAL_BASED_OUTPATIENT_CLINIC_OR_DEPARTMENT_OTHER)
Admission: RE | Admit: 2013-10-27 | Discharge: 2013-10-27 | Disposition: A | Discharge status: Home / Self Care | Payer: Medicare Other | Source: Ambulatory Visit | Attending: Physician Assistant | Admitting: Physician Assistant

## 2013-10-27 ENCOUNTER — Ambulatory Visit (INDEPENDENT_AMBULATORY_CARE_PROVIDER_SITE_OTHER): Payer: Medicare Other | Admitting: Physician Assistant

## 2013-10-27 VITALS — BP 112/68 | HR 100 | Temp 98.4°F | Resp 16 | Ht 62.5 in | Wt 113.2 lb

## 2013-10-27 DIAGNOSIS — S99919A Unspecified injury of unspecified ankle, initial encounter: Secondary | ICD-10-CM

## 2013-10-27 DIAGNOSIS — S99922A Unspecified injury of left foot, initial encounter: Secondary | ICD-10-CM

## 2013-10-27 DIAGNOSIS — R55 Syncope and collapse: Secondary | ICD-10-CM

## 2013-10-27 DIAGNOSIS — S99912A Unspecified injury of left ankle, initial encounter: Secondary | ICD-10-CM

## 2013-10-27 DIAGNOSIS — I6529 Occlusion and stenosis of unspecified carotid artery: Secondary | ICD-10-CM | POA: Insufficient documentation

## 2013-10-27 DIAGNOSIS — W19XXXA Unspecified fall, initial encounter: Secondary | ICD-10-CM | POA: Insufficient documentation

## 2013-10-27 DIAGNOSIS — R110 Nausea: Secondary | ICD-10-CM

## 2013-10-27 DIAGNOSIS — S99929A Unspecified injury of unspecified foot, initial encounter: Secondary | ICD-10-CM

## 2013-10-27 DIAGNOSIS — S8990XA Unspecified injury of unspecified lower leg, initial encounter: Secondary | ICD-10-CM | POA: Insufficient documentation

## 2013-10-27 DIAGNOSIS — Y929 Unspecified place or not applicable: Secondary | ICD-10-CM | POA: Insufficient documentation

## 2013-10-27 MED ORDER — ONDANSETRON 8 MG PO TBDP: 8.0000 mg | ORAL_TABLET | Freq: Three times a day (TID) | ORAL | Status: DC | PRN

## 2013-10-27 NOTE — Progress Notes (Signed)
   Subjective:    Patient ID: Molly AlarStephanie C Mantell, female    DOB: 06/08/1960, 53 y.o.   MRN: [id number]  HPI    Review of Systems     Objective:   Physical Exam        Assessment & Plan:

## 2013-10-27 NOTE — Patient Instructions (Signed)
Please obtain labs.  Stay well hydrated.  The Zofran will help with nausea.  The nausea and headache should improve.  Continue your medications as directed.  Please go to the lab.  Stop by the front desk for scheduling your CT scan. Then go downstairs for x-ray.  We will call you with your results.  If your symptoms return and you have a fainting spell, please call 911.

## 2013-10-27 NOTE — Progress Notes (Signed)
Patient presents to clinic today c/o fall sustained 2 days ago.  Patient is deaf.  Interpreter is present throughout interview and examination.  Endorses walking outside down the steps at 10 o clock at night.  Blacked out and fell onto the ground. Was a non witnessed fall. States she had to be out for less than 1 minute. When she woke up she had significant L ankle pain.  Screamed for family.  States that they helped her get up.  Patient states because she is a diabetic, she immediately checked bs after and it was in the 120s. The day after the incident, she states she started feeling dizzy and nauseas all day.  Tried to eat but made her nauseas.  Has been able to tolerate fluids.  Denies chest pain, palpitations or SOB.  Has been feeling fine today except for ankle pain.  With the ankle she endorses swelling, bruising, an abrasion and decreased ROM.  Has kept ice on the area.  Past Medical History  Diagnosis Date  . Depression   . Diabetes mellitus   . GERD (gastroesophageal reflux disease)   . Hypertension   . Heart murmur   . Hyperlipidemia   . Stomach ulcer 28yr of age  . Kidney stone   . Migraine   . 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 Anxiety disorder 02/19/2011  . Mitral valve prolapse 02/19/2011  . Sleep apnea 02/19/2011    Current Outpatient Prescriptions on File Prior to Visit  Medication Sig Dispense Refill  . Calcium Carbonate-Vitamin D (CALTRATE 600+D) 600-400 MG-UNIT per tablet Take 1 tablet by mouth daily.        . Canagliflozin 100 MG TABS 1/2 tab by mouth once daily. Increase to full tab once daily as tolerated  30 tablet  2  . estradiol (ESTRACE) 1 MG tablet TAKE ONE TABLET BY MOUTH EVERY DAY  30 tablet  2  . glucose blood (ONE TOUCH ULTRA TEST) test strip Use to check blood sugar every morning.  100 each  2  . JANUMET XR 50-1000 MG TB24 TAKE ONE TABLET BY MOUTH ONCE DAILY  90 tablet  1  . lisinopril (PRINIVIL,ZESTRIL) 5 MG tablet Take 1 tablet (5 mg total) by mouth daily.  For kidney protection.  30 tablet  2  . LIVALO 4 MG TABS TAKE ONE TABLET BY MOUTH ONCE DAILY  90 tablet  0  . pantoprazole (PROTONIX) 40 MG tablet Take 40 mg by mouth daily.        . pioglitazone (ACTOS) 30 MG tablet Take 1 tablet (30 mg total) by mouth daily.  30 tablet  3  . Probiotic Product (PROBIOTIC DAILY PO) Take 1 tablet by mouth daily.       No current facility-administered medications on file prior to visit.    Allergies  Allergen Reactions  . Aspirin Other (See Comments)    Stomach pains  . Codeine Other (See Comments)    Nosebleed  . Eggs Or Egg-Derived Products     Pt reports allergy to RAW EGGS. ? Stomach upset, diarrhea  . Erythromycin Nausea And Vomiting  . Fluticasone Propionate     Stomach upset  . Penicillins Hives  . Prilosec [Omeprazole Magnesium]     migraine  . Vaseline [Petrolatum] Itching  . Vicodin [Hydrocodone-Acetaminophen]     twitching  . Zetia [Ezetimibe] Diarrhea    Family History  Problem Relation Age of Onset  . Kidney disease Mother   . Multiple sclerosis Mother   . Other Mother  polio  . Cancer Father     colon  . Hyperlipidemia Father   . Heart disease Father   . Hypertension Father   . Diabetes Maternal Aunt   . Heart disease Maternal Grandmother   . Hypertension Maternal Grandmother   . Diabetes Maternal Grandmother   . Heart disease Maternal Grandfather   . Hypertension Maternal Grandfather     History   Social History  . Marital Status: Single    Spouse Name: N/A    Number of Children: N/A  . Years of Education: N/A   Social History Main Topics  . Smoking status: Passive Smoke Exposure - Never Smoker  . Smokeless tobacco: Never Used  . Alcohol Use: No  . Drug Use: No  . Sexual Activity: None   Other Topics Concern  . None   Social History Narrative   Regular exercise:  Yes. 3-5 x weekly   Caffeine Use:  No   Married- lives with husband, 2 children (age 84 and 74).   Disabled- due to deafness and overall  work. Couldn't keep up.  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         Review of Systems - See HPI.  All other ROS are negative.  BP 112/68  Pulse 100  Temp(Src) 98.4 F (36.9 C) (Oral)  Resp 16  Ht 5' 2.5" (1.588 m)  Wt 113 lb 4 oz (51.37 kg)  BMI 20.37 kg/m2  SpO2 96%  LMP 04/28/1998  Physical Exam  Vitals reviewed. Constitutional: She is oriented to person, place, and time and well-developed, well-nourished, and in no distress.  HENT:  Head: Normocephalic and atraumatic.  Right Ear: External ear normal.  Left Ear: External ear normal.  Nose: Nose normal.  Mouth/Throat: Oropharynx is clear and moist. No oropharyngeal exudate.  Eyes: Conjunctivae are normal. Pupils are equal, round, and reactive to light.  Neck: Neck supple. No thyromegaly present.  Cardiovascular: Normal rate, regular rhythm, normal heart sounds and intact distal pulses.   Pulmonary/Chest: Effort normal and breath sounds normal. No respiratory distress. She has no wheezes. She has no rales. She exhibits no tenderness.  Musculoskeletal:       Left knee: Normal.       Left ankle: She exhibits decreased range of motion and swelling. Tenderness. Lateral malleolus and AITFL tenderness found. Achilles tendon normal.       Left lower leg: Normal.       Left foot: She exhibits tenderness and swelling. She exhibits normal capillary refill.  Neurological: She is alert and oriented to person, place, and time. She has normal sensation, normal strength and intact cranial nerves. Gait normal.  Skin: Skin is warm and dry.  Psychiatric: Affect normal.    Recent Results (from the past 2160 hour(s))  BASIC METABOLIC PANEL WITH GFR     Status: Abnormal   Collection Time    09/02/13  8:59 AM      Result Value Ref Range   Sodium 136  135 - 145 mEq/L   Potassium 4.0  3.5 - 5.3 mEq/L   Chloride 99  96 - 112 mEq/L   CO2 27  19 - 32 mEq/L    Glucose, Bld 123 (*) 70 - 99 mg/dL   BUN 15  6 - 23 mg/dL   Creat 0.63  0.50 - 1.10 mg/dL   Calcium 9.2  8.4 - 10.5 mg/dL   GFR, Est  African American >89     GFR, Est Non African American >89     Comment:       The estimated GFR is a calculation valid for adults (>=70 years old)     that uses the CKD-EPI algorithm to adjust for age and sex. It is       not to be used for children, pregnant women, hospitalized patients,        patients on dialysis, or with rapidly changing kidney function.     According to the NKDEP, eGFR >89 is normal, 60-89 shows mild     impairment, 30-59 shows moderate impairment, 15-29 shows severe     impairment and <15 is ESRD.        HEMOGLOBIN A1C     Status: Abnormal   Collection Time    09/02/13  8:59 AM      Result Value Ref Range   Hemoglobin A1C 6.7 (*) <5.7 %   Comment:                                                                            According to the ADA Clinical Practice Recommendations for 2011, when     HbA1c is used as a screening test:             >=6.5%   Diagnostic of Diabetes Mellitus                (if abnormal result is confirmed)           5.7-6.4%   Increased risk of developing Diabetes Mellitus           References:Diagnosis and Classification of Diabetes Mellitus,Diabetes     OEUM,3536,14(ERXVQ 1):S62-S69 and Standards of Medical Care in             Diabetes - 2011,Diabetes Care,2011,34 (Suppl 1):S11-S61.         Mean Plasma Glucose 146 (*) <117 mg/dL  POCT URINALYSIS DIPSTICK     Status: None   Collection Time    09/02/13  9:38 AM      Result Value Ref Range   Color, UA yellow     Clarity, UA clear     Glucose, UA negative     Bilirubin, UA small     Ketones, UA 5     Spec Grav, UA 1.025     Blood, UA negative     pH, UA 6.0     Protein, UA negative     Urobilinogen, UA 0.2     Nitrite, UA negative     Leukocytes, UA Negative    BASIC METABOLIC PANEL     Status: Abnormal   Collection Time    10/27/13  3:22 PM       Result Value Ref Range   Sodium 136  135 - 145 mEq/L   Potassium 3.7  3.5 - 5.3 mEq/L   Chloride 99  96 - 112 mEq/L   CO2 27  19 - 32 mEq/L   Glucose, Bld 155 (*) 70 - 99 mg/dL   BUN 17  6 - 23 mg/dL   Creat 0.73  0.50 - 1.10 mg/dL   Calcium 9.7  8.4 - 10.5  mg/dL    Assessment/Plan: Syncope and collapse Unclear etiology.  Possible Vasovagal episode.  Will obtain labs.  Will obtain CT head. EKG obtained and unchanged from prior EKG.  No acute abnormality. Stay well hydrated.  Continue medications as directed.  Nausea likely 2/2 concussion.  Medication given. Rest.  If symptoms recur, call 911 or have family carry to the ER.  Injury of left ankle and foot Concern for fracture.  Will obtain x-ray of left foot and ankle.  Brace given.  RICE therapy.  Tylenol for pain. If fracture present, will refer to Ortho.  ROM exercises discussed to begin if no fracture present, after swelling has resolved.

## 2013-10-27 NOTE — Progress Notes (Signed)
Pre visit review using our clinic review tool, if applicable. No additional management support is needed unless otherwise documented below in the visit note/SLS  

## 2013-10-28 LAB — BASIC METABOLIC PANEL
BUN: 17 mg/dL (ref 6–23)
CALCIUM: 9.7 mg/dL (ref 8.4–10.5)
CO2: 27 mEq/L (ref 19–32)
CREATININE: 0.73 mg/dL (ref 0.50–1.10)
Chloride: 99 mEq/L (ref 96–112)
GLUCOSE: 155 mg/dL — AB (ref 70–99)
Potassium: 3.7 mEq/L (ref 3.5–5.3)
Sodium: 136 mEq/L (ref 135–145)

## 2013-11-02 DIAGNOSIS — S99922A Unspecified injury of left foot, initial encounter: Secondary | ICD-10-CM

## 2013-11-02 DIAGNOSIS — R55 Syncope and collapse: Secondary | ICD-10-CM | POA: Insufficient documentation

## 2013-11-02 DIAGNOSIS — S99912A Unspecified injury of left ankle, initial encounter: Secondary | ICD-10-CM | POA: Insufficient documentation

## 2013-11-02 NOTE — Assessment & Plan Note (Signed)
Concern for fracture.  Will obtain x-ray of left foot and ankle.  Brace given.  RICE therapy.  Tylenol for pain. If fracture present, will refer to Ortho.  ROM exercises discussed to begin if no fracture present, after swelling has resolved.

## 2013-11-02 NOTE — Assessment & Plan Note (Signed)
Unclear etiology.  Possible Vasovagal episode.  Will obtain labs.  Will obtain CT head. EKG obtained and unchanged from prior EKG.  No acute abnormality. Stay well hydrated.  Continue medications as directed.  Nausea likely 2/2 concussion.  Medication given. Rest.  If symptoms recur, call 911 or have family carry to the ER.

## 2013-11-07 ENCOUNTER — Other Ambulatory Visit: Payer: Self-pay | Admitting: Family

## 2013-11-28 ENCOUNTER — Other Ambulatory Visit: Payer: Self-pay | Admitting: Family

## 2013-12-06 ENCOUNTER — Ambulatory Visit: Payer: Medicare Other | Admitting: Family

## 2013-12-08 ENCOUNTER — Other Ambulatory Visit: Payer: Self-pay | Admitting: Family

## 2013-12-08 NOTE — Telephone Encounter (Signed)
Rx sent to pharmacy. LDM 

## 2013-12-19 ENCOUNTER — Ambulatory Visit: Payer: Medicare Other | Admitting: Family

## 2013-12-21 ENCOUNTER — Ambulatory Visit: Payer: Medicare Other | Admitting: Family

## 2013-12-23 ENCOUNTER — Ambulatory Visit (INDEPENDENT_AMBULATORY_CARE_PROVIDER_SITE_OTHER): Payer: Medicare Other | Admitting: Family

## 2013-12-23 ENCOUNTER — Encounter: Payer: Self-pay | Admitting: Family

## 2013-12-23 VITALS — BP 120/80 | HR 71 | Temp 97.8°F | Resp 16 | Ht 63.0 in | Wt 115.0 lb

## 2013-12-23 DIAGNOSIS — S99929A Unspecified injury of unspecified foot, initial encounter: Secondary | ICD-10-CM

## 2013-12-23 DIAGNOSIS — E119 Type 2 diabetes mellitus without complications: Secondary | ICD-10-CM

## 2013-12-23 DIAGNOSIS — S99919A Unspecified injury of unspecified ankle, initial encounter: Secondary | ICD-10-CM

## 2013-12-23 DIAGNOSIS — S99912A Unspecified injury of left ankle, initial encounter: Secondary | ICD-10-CM

## 2013-12-23 DIAGNOSIS — S99922A Unspecified injury of left foot, initial encounter: Secondary | ICD-10-CM

## 2013-12-23 DIAGNOSIS — S8990XA Unspecified injury of unspecified lower leg, initial encounter: Secondary | ICD-10-CM

## 2013-12-23 DIAGNOSIS — E785 Hyperlipidemia, unspecified: Secondary | ICD-10-CM

## 2013-12-23 DIAGNOSIS — R319 Hematuria, unspecified: Secondary | ICD-10-CM | POA: Insufficient documentation

## 2013-12-23 LAB — POCT URINALYSIS DIPSTICK
Bilirubin, UA: NEGATIVE
Glucose, UA: NEGATIVE
Ketones, UA: NEGATIVE
Leukocytes, UA: NEGATIVE
NITRITE UA: NEGATIVE
PH UA: 6
RBC UA: NEGATIVE
Spec Grav, UA: 1.02
UROBILINOGEN UA: 0.2

## 2013-12-23 LAB — HEPATIC FUNCTION PANEL
ALBUMIN: 4.3 g/dL (ref 3.5–5.2)
ALK PHOS: 41 U/L (ref 39–117)
ALT: <8 U/L (ref 0–35)
AST: 11 U/L (ref 0–37)
BILIRUBIN DIRECT: 0.1 mg/dL (ref 0.0–0.3)
BILIRUBIN INDIRECT: 0.4 mg/dL (ref 0.2–1.2)
Total Bilirubin: 0.5 mg/dL (ref 0.2–1.2)
Total Protein: 7.1 g/dL (ref 6.0–8.3)

## 2013-12-23 LAB — HEMOGLOBIN A1C
HEMOGLOBIN A1C: 6.7 % — AB (ref <5.7)
MEAN PLASMA GLUCOSE: 146 mg/dL — AB (ref <117)

## 2013-12-23 NOTE — Progress Notes (Signed)
Subjective:    Patient ID: Molly May, female    DOB: 1960-08-07, 53 y.o.   MRN: [id number]  HPI  Molly May is a 53 yr old female who presents today for follow up. She is accompanied by a sign language interpreter.   1) Hyperlipidemia-  She is maintained on Livalo.  Denies myalgia.  She does report some HS calf cramping at night.  Denies myalgia.    2) DM2- reports that her blood sugars are fluctuating. She is maintained on Janumet xr and actos. She reports that stress effects her sugar.  Reports sugar 104. Has had high 180 and a low of 80.  Egg allergy, declines flu shot.   Due for eye exam 9/15.    Lab Results  Component Value Date   HGBA1C 6.7* 09/02/2013   HGBA1C 7.1* 05/31/2013   HGBA1C 7.4* 02/28/2013   Lab Results  Component Value Date   MICROALBUR 0.51 11/29/2012   LDLCALC 56 05/31/2013   CREATININE 0.73 10/27/2013    3) Foot pain- reprorts pain of the dorsal left foot since fall on 10/25/13. X ray 7/2 was negative for fracture. She continues to have pain with walking.    4) Hematuria- reports intermittent hematuria and slight right lower back pain x 2 weeks. She reports that she saw blood in the urine first two weeks ago. Last week she saw again, none since. She reports that right sided back pain is improved.    Review of Systems    see HPI  Past Medical History  Diagnosis Date  . Depression   . Diabetes mellitus   . GERD (gastroesophageal reflux disease)   . Hypertension   . Heart murmur   . Hyperlipidemia   . Stomach ulcer 26yrs of age  . Kidney stone   . Migraine   . 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 Anxiety disorder 02/19/2011  . Mitral valve prolapse 02/19/2011  . Sleep apnea 02/19/2011    History   Social History  . Marital Status: Single    Spouse Name: N/A    Number of Children: N/A  . Years of Education: N/A   Occupational History  . Not on file.   Social History Main Topics  . Smoking status: Passive Smoke Exposure - Never Smoker  .  Smokeless tobacco: Never Used  . Alcohol Use: No  . Drug Use: No  . Sexual Activity: Not on file   Other Topics Concern  . Not on file   Social History Narrative   Regular exercise:  Yes. 3-5 x weekly   Caffeine Use:  No   Married- lives with husband, 2 children (age 18 and 34).   Disabled- due to deafness and overall work. Couldn't keep up.  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          Past Surgical History  Procedure Laterality Date  . Cholecystectomy  2011  . Breast surgery  2011    biposy--benign  . Appendectomy  1986  . Tonsillectomy and adenoidectomy  1973  . Abdominal hysterectomy  2000  . Ovarian cyst removal  1986, 1988  . Upper gastrointestinal endoscopy  2009    Gets them every 3 years  . Cataract extraction  10/13/11    right eye    Family History  Problem Relation Age of Onset  . Kidney disease Mother   . Multiple sclerosis Mother   . Other  Mother     polio  . Cancer Father     colon  . Hyperlipidemia Father   . Heart disease Father   . Hypertension Father   . Diabetes Maternal Aunt   . Heart disease Maternal Grandmother   . Hypertension Maternal Grandmother   . Diabetes Maternal Grandmother   . Heart disease Maternal Grandfather   . Hypertension Maternal Grandfather     Allergies  Allergen Reactions  . Aspirin Other (See Comments)    Stomach pains  . Codeine Other (See Comments)    Nosebleed  . Eggs Or Egg-Derived Products     Pt reports allergy to RAW EGGS. ? Stomach upset, diarrhea  . Erythromycin Nausea And Vomiting  . Fluticasone Propionate     Stomach upset  . Penicillins Hives  . Prilosec [Omeprazole Magnesium]     migraine  . Vaseline [Petrolatum] Itching  . Vicodin [Hydrocodone-Acetaminophen]     twitching  . Zetia [Ezetimibe] Diarrhea    Current Outpatient Prescriptions on File Prior to Visit  Medication Sig Dispense Refill  . Calcium  Carbonate-Vitamin D (CALTRATE 600+D) 600-400 MG-UNIT per tablet Take 1 tablet by mouth daily.        Marland Kitchen estradiol (ESTRACE) 1 MG tablet TAKE ONE TABLET BY MOUTH EVERY DAY  30 tablet  2  . estradiol (ESTRACE) 1 MG tablet TAKE ONE TABLET BY MOUTH ONCE DAILY  30 tablet  2  . glucose blood (ONE TOUCH ULTRA TEST) test strip Use to check blood sugar every morning.  100 each  2  . JANUMET XR 50-1000 MG TB24 TAKE ONE TABLET BY MOUTH ONCE DAILY  90 tablet  1  . lisinopril (PRINIVIL,ZESTRIL) 5 MG tablet TAKE ONE TABLET BY MOUTH ONCE DAILY FOR KIDNEY PROTECTION  30 tablet  2  . LIVALO 4 MG TABS TAKE ONE TABLET BY MOUTH ONCE DAILY  90 tablet  0  . pantoprazole (PROTONIX) 40 MG tablet Take 40 mg by mouth daily.        . pioglitazone (ACTOS) 30 MG tablet Take 1 tablet (30 mg total) by mouth daily.  30 tablet  3  . Probiotic Product (PROBIOTIC DAILY PO) Take 1 tablet by mouth daily.       No current facility-administered medications on file prior to visit.    BP 120/80  Pulse 71  Temp(Src) 97.8 F (36.6 C) (Oral)  Resp 16  Ht  (1.6 m)  Wt 115 lb 0.6 oz (52.182 kg)  BMI 20.38 kg/m2  SpO2 100%  LMP 04/28/1998    Objective:   Physical Exam  Constitutional: She is oriented to person, place, and time. She appears well-developed and well-nourished. No distress.  HENT:  Head: Normocephalic and atraumatic.  Cardiovascular: Normal rate and regular rhythm.   No murmur heard. Pulmonary/Chest: Effort normal and breath sounds normal. No respiratory distress. She has no wheezes. She has no rales. She exhibits no tenderness.  Abdominal:  Very mild right sided discomfort when checked for CVAT  Neurological: She is alert and oriented to person, place, and time.  Psychiatric: She has a normal mood and affect. Her behavior is normal. Judgment and thought content normal.          Assessment & Plan:

## 2013-12-23 NOTE — Assessment & Plan Note (Addendum)
UA neg for blood, leuks and nitrites today in office. Suspect that pt may have passed a kidney stone.

## 2013-12-23 NOTE — Assessment & Plan Note (Signed)
At goal.  Continue livalo, obtain lft.

## 2013-12-23 NOTE — Progress Notes (Signed)
Pre visit review using our clinic review tool, if applicable. No additional management support is needed unless otherwise documented below in the visit note. 

## 2013-12-23 NOTE — Assessment & Plan Note (Signed)
X ray negative, recommend prn tylenol.

## 2013-12-23 NOTE — Patient Instructions (Signed)
Please complete lab work prior to leaving. Schedule follow up eye exam. Follow up in 3 months.   You may use tylenol as needed for foot pain.

## 2013-12-23 NOTE — Assessment & Plan Note (Signed)
Obtain a1c, urine microalbumin. Advised pt to schedule annual eye exam.

## 2013-12-24 LAB — URINALYSIS, ROUTINE W REFLEX MICROSCOPIC
Bilirubin Urine: NEGATIVE
Glucose, UA: NEGATIVE mg/dL
Hgb urine dipstick: NEGATIVE
Ketones, ur: NEGATIVE mg/dL
LEUKOCYTES UA: NEGATIVE
Nitrite: NEGATIVE
PROTEIN: NEGATIVE mg/dL
Specific Gravity, Urine: 1.028 (ref 1.005–1.030)
UROBILINOGEN UA: 0.2 mg/dL (ref 0.0–1.0)
pH: 5.5 (ref 5.0–8.0)

## 2013-12-24 LAB — URINALYSIS, MICROSCOPIC ONLY
Casts: NONE SEEN
Crystals: NONE SEEN

## 2013-12-24 LAB — URINE CULTURE: Colony Count: 35000

## 2013-12-26 ENCOUNTER — Encounter: Payer: Self-pay | Admitting: Family

## 2013-12-26 ENCOUNTER — Other Ambulatory Visit: Payer: Self-pay | Admitting: Family

## 2013-12-26 MED ORDER — CIPROFLOXACIN HCL 500 MG PO TABS: 500.0000 mg | ORAL_TABLET | Freq: Two times a day (BID) | ORAL | Status: DC

## 2014-01-09 ENCOUNTER — Other Ambulatory Visit: Payer: Self-pay | Admitting: Family

## 2014-01-25 ENCOUNTER — Other Ambulatory Visit: Payer: Self-pay | Admitting: Family

## 2014-01-31 ENCOUNTER — Telehealth: Payer: Self-pay

## 2014-01-31 NOTE — Telephone Encounter (Signed)
Spoke with patient through the help of an interpreter and she shared that she has an upcoming eye exam scheduled for 02/06/14 @ 10 am [facility], Dr. Vickey HugerMichael Tepedino.

## 2014-02-07 ENCOUNTER — Other Ambulatory Visit: Payer: Self-pay | Admitting: Family

## 2014-03-14 ENCOUNTER — Ambulatory Visit (INDEPENDENT_AMBULATORY_CARE_PROVIDER_SITE_OTHER): Payer: Medicare Other | Admitting: Family

## 2014-03-14 ENCOUNTER — Encounter: Payer: Self-pay | Admitting: Family

## 2014-03-14 VITALS — BP 130/84 | HR 75 | Temp 97.4°F | Resp 16 | Ht 63.0 in | Wt 117.0 lb

## 2014-03-14 DIAGNOSIS — N3001 Acute cystitis with hematuria: Secondary | ICD-10-CM

## 2014-03-14 DIAGNOSIS — R300 Dysuria: Secondary | ICD-10-CM

## 2014-03-14 DIAGNOSIS — K219 Gastro-esophageal reflux disease without esophagitis: Secondary | ICD-10-CM

## 2014-03-14 DIAGNOSIS — E119 Type 2 diabetes mellitus without complications: Secondary | ICD-10-CM

## 2014-03-14 DIAGNOSIS — N390 Urinary tract infection, site not specified: Secondary | ICD-10-CM | POA: Insufficient documentation

## 2014-03-14 DIAGNOSIS — E785 Hyperlipidemia, unspecified: Secondary | ICD-10-CM

## 2014-03-14 DIAGNOSIS — Z23 Encounter for immunization: Secondary | ICD-10-CM

## 2014-03-14 LAB — POCT URINALYSIS DIPSTICK
Bilirubin, UA: NEGATIVE
GLUCOSE UA: NEGATIVE
KETONES UA: NEGATIVE
Nitrite, UA: NEGATIVE
SPEC GRAV UA: 1.015
UROBILINOGEN UA: NEGATIVE
pH, UA: 6.5

## 2014-03-14 MED ORDER — CIPROFLOXACIN HCL 500 MG PO TABS: 500.0000 mg | ORAL_TABLET | Freq: Two times a day (BID) | ORAL | Status: DC

## 2014-03-14 NOTE — Progress Notes (Signed)
Subjective:    Patient ID: Molly May, female    DOB: 09/02/1960, 53 y.o.   MRN: [id number]  HPI   Molly May is a 53 yr old female who presents today for follow up. She is accompanied to day by a sign language interpreter.   1) Hyperlipidemia- maintained on livalo. Denies myalgias.   Lab Results  Component Value Date   CHOL 162 05/31/2013   HDL 71 05/31/2013   LDLCALC 56 05/31/2013   TRIG 177* 05/31/2013   CHOLHDL 2.3 05/31/2013   2) DM2- maintained on janumet, actos.  Reports that her sugars are running well, was 113 this AM.   Lab Results  Component Value Date   HGBA1C 6.7* 12/23/2013   3) Urinary frequency- was up all night.  + cramping.    4) GERD- maintained on protonix.  Reports that she continues to see GI.   Reports planned cataract surgery.   Review of Systems See HPI  Past Medical History  Diagnosis Date  . Depression   . Diabetes mellitus   . GERD (gastroesophageal reflux disease)   . Hypertension   . Heart murmur   . Hyperlipidemia   . Stomach ulcer [age]yrs of age  . Kidney stone   . Migraine   . 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 Anxiety disorder 02/19/2011  . Mitral valve prolapse 02/19/2011  . Sleep apnea 02/19/2011    History   Social History  . Marital Status: Single    Spouse Name: N/A    Number of Children: N/A  . Years of Education: N/A   Occupational History  . Not on file.   Social History Main Topics  . Smoking status: Passive Smoke Exposure - Never Smoker  . Smokeless tobacco: Never Used  . Alcohol Use: No  . Drug Use: No  . Sexual Activity: Not on file   Other Topics Concern  . Not on file   Social History Narrative   Regular exercise:  Yes. 3-5 x weekly   Caffeine Use:  No   Married- lives with husband, 2 children (age 467 and 848).   Disabled- due to deafness and overall work. Couldn't keep up.  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            Past Surgical History  Procedure Laterality Date  . Cholecystectomy  2011  . Breast surgery  2011    biposy--benign  . Appendectomy  1986  . Tonsillectomy and adenoidectomy  1973  . Abdominal hysterectomy  2000  . Ovarian cyst removal  1986, 1988  . Upper gastrointestinal endoscopy  2009    Gets them every 3 years  . Cataract extraction  10/13/11    right eye    Family History  Problem Relation Age of Onset  . Kidney disease Mother   . Multiple sclerosis Mother   . Other Mother     polio  . Cancer Father     colon  . Hyperlipidemia Father   . Heart disease Father   . Hypertension Father   . Diabetes Maternal Aunt   . Heart disease Maternal Grandmother   . Hypertension Maternal Grandmother   . Diabetes Maternal Grandmother   . Heart disease Maternal Grandfather   . Hypertension Maternal Grandfather     Allergies  Allergen Reactions  . Aspirin Other (See Comments)    Stomach pains  . Codeine Other (See Comments)  Nosebleed  . Eggs Or Egg-Derived Products     Pt reports allergy to RAW EGGS. ? Stomach upset, diarrhea  . Erythromycin Nausea And Vomiting  . Fluticasone Propionate     Stomach upset  . Penicillins Hives  . Prilosec [Omeprazole Magnesium]     migraine  . Vaseline [Petrolatum] Itching  . Vicodin [Hydrocodone-Acetaminophen]     twitching  . Zetia [Ezetimibe] Diarrhea    Current Outpatient Prescriptions on File Prior to Visit  Medication Sig Dispense Refill  . Calcium Carbonate-Vitamin D (CALTRATE 600+D) 600-400 MG-UNIT per tablet Take 1 tablet by mouth daily.      Marland Kitchen. estradiol (ESTRACE) 1 MG tablet TAKE ONE TABLET BY MOUTH ONCE DAILY 30 tablet 2  . glucose blood (ONE TOUCH ULTRA TEST) test strip Use to check blood sugar every morning. 100 each 2  . JANUMET XR 50-1000 MG TB24 TAKE ONE TABLET BY MOUTH ONCE DAILY 90 tablet 1  . lisinopril (PRINIVIL,ZESTRIL) 5 MG tablet TAKE ONE TABLET BY MOUTH ONCE DAILY FOR KIDNEY PROTECTION 30 tablet 2  .  LIVALO 4 MG TABS TAKE ONE TABLET BY MOUTH ONCE DAILY 90 tablet 1  . pantoprazole (PROTONIX) 40 MG tablet Take 40 mg by mouth daily.      . pioglitazone (ACTOS) 30 MG tablet TAKE ONE TABLET BY MOUTH ONCE DAILY 30 tablet 3  . Probiotic Product (PROBIOTIC DAILY PO) Take 1 tablet by mouth daily.     No current facility-administered medications on file prior to visit.    BP 130/84 mmHg  Pulse 75  Temp(Src) 97.4 F (36.3 C) (Oral)  Resp 16  Ht 5\' 3"  (1.6 m)  Wt 117 lb (53.071 kg)  BMI 20.73 kg/m2  SpO2 94%  LMP 04/28/1998       Objective:   Physical Exam  Constitutional: She is oriented to person, place, and time. She appears well-developed and well-nourished. No distress.  HENT:  Head: Normocephalic and atraumatic.  Cardiovascular: Normal rate and regular rhythm.   No murmur heard. Pulmonary/Chest: Effort normal and breath sounds normal. No respiratory distress. She has no wheezes. She has no rales. She exhibits no tenderness.  Abdominal: Soft. Bowel sounds are normal. She exhibits no distension. There is no tenderness.  Musculoskeletal: She exhibits no edema.  Neurological: She is alert and oriented to person, place, and time.  Skin: Skin is warm and dry.  Psychiatric: She has a normal mood and affect. Her behavior is normal. Judgment and thought content normal.          Assessment & Plan:

## 2014-03-14 NOTE — Patient Instructions (Signed)
Please follow up in 3 months.  

## 2014-03-14 NOTE — Assessment & Plan Note (Signed)
UA + for leuks,+ blood. Send for culture. Rx with cipro.

## 2014-03-14 NOTE — Assessment & Plan Note (Signed)
Last lipid panel was stable with exception of elevated trig.

## 2014-03-14 NOTE — Progress Notes (Signed)
Pre visit review using our clinic review tool, if applicable. No additional management support is needed unless otherwise documented below in the visit note. 

## 2014-03-14 NOTE — Assessment & Plan Note (Signed)
Stable on current meds. a1C up to date. Continue same.

## 2014-03-14 NOTE — Assessment & Plan Note (Signed)
Stable on PPI, continue protonix.

## 2014-03-16 ENCOUNTER — Telehealth: Payer: Self-pay | Admitting: Family

## 2014-03-16 MED ORDER — CIPROFLOXACIN HCL 500 MG PO TABS: 500.0000 mg | ORAL_TABLET | Freq: Two times a day (BID) | ORAL | Status: DC

## 2014-03-16 NOTE — Telephone Encounter (Signed)
See mychart.  

## 2014-03-17 LAB — URINE CULTURE

## 2014-03-20 ENCOUNTER — Ambulatory Visit: Payer: Medicare Other | Admitting: Family

## 2014-03-28 HISTORY — PX: CATARACT EXTRACTION: SUR2

## 2014-03-30 NOTE — Telephone Encounter (Signed)
Received [facility] via fax [facility], Dr. Vickey HugerMichael Tepedino. Left eye cataract surgery is scheduled for Wednesday, April 05, 2014. Form [facility] to [facility]. JG//CMA

## 2014-04-17 ENCOUNTER — Other Ambulatory Visit: Payer: Self-pay | Admitting: Family

## 2014-05-03 ENCOUNTER — Telehealth: Payer: Self-pay | Admitting: Family

## 2014-05-03 ENCOUNTER — Encounter: Payer: Self-pay | Admitting: Physician Assistant

## 2014-05-03 ENCOUNTER — Ambulatory Visit (INDEPENDENT_AMBULATORY_CARE_PROVIDER_SITE_OTHER): Payer: Medicare Other | Admitting: Physician Assistant

## 2014-05-03 VITALS — BP 133/84 | HR 100 | Temp 98.1°F | Resp 16 | Ht 63.0 in | Wt 122.1 lb

## 2014-05-03 DIAGNOSIS — B9689 Other specified bacterial agents as the cause of diseases classified elsewhere: Secondary | ICD-10-CM

## 2014-05-03 DIAGNOSIS — J208 Acute bronchitis due to other specified organisms: Principal | ICD-10-CM

## 2014-05-03 DIAGNOSIS — J00 Acute nasopharyngitis [common cold]: Secondary | ICD-10-CM

## 2014-05-03 MED ORDER — AZITHROMYCIN 250 MG PO TABS: ORAL_TABLET | ORAL | Status: DC

## 2014-05-03 MED ORDER — BENZONATATE 100 MG PO CAPS: 100.0000 mg | ORAL_CAPSULE | Freq: Two times a day (BID) | ORAL | Status: DC | PRN

## 2014-05-03 NOTE — Progress Notes (Signed)
Pre visit review using our clinic review tool, if applicable. No additional management support is needed unless otherwise documented below in the visit note/SLS  

## 2014-05-03 NOTE — Patient Instructions (Signed)
Please take the Azithromycin (Zithromax) as directed.  Use Tessalon Perles for cough.  Increase fluids.  Rest.  Saline nasal spray. Place a humidifier in bedroom.  Call or return to clinic if symptoms are not improving.  Acute Bronchitis Bronchitis is inflammation of the airways that extend from the windpipe into the lungs (bronchi). The inflammation often causes mucus to develop. This leads to a cough, which is the most common symptom of bronchitis.  In acute bronchitis, the condition usually develops suddenly and goes away over time, usually in a couple weeks. Smoking, allergies, and asthma can make bronchitis worse. Repeated episodes of bronchitis may cause further lung problems.  CAUSES Acute bronchitis is most often caused by the same virus that causes a cold. The virus can spread from person to person (contagious) through coughing, sneezing, and touching contaminated objects. SIGNS AND SYMPTOMS   Cough.   Fever.   Coughing up mucus.   Body aches.   Chest congestion.   Chills.   Shortness of breath.   Sore throat.  DIAGNOSIS  Acute bronchitis is usually diagnosed through a physical exam. Your health care provider will also ask you questions about your medical history. Tests, such as chest X-rays, are sometimes done to rule out other conditions.  TREATMENT  Acute bronchitis usually goes away in a couple weeks. Oftentimes, no medical treatment is necessary. Medicines are sometimes given for relief of fever or cough. Antibiotic medicines are usually not needed but may be prescribed in certain situations. In some cases, an inhaler may be recommended to help reduce shortness of breath and control the cough. A cool mist vaporizer may also be used to help thin bronchial secretions and make it easier to clear the chest.  HOME CARE INSTRUCTIONS  Get plenty of rest.   Drink enough fluids to keep your urine clear or pale yellow (unless you have a medical condition that requires  fluid restriction). Increasing fluids may help thin your respiratory secretions (sputum) and reduce chest congestion, and it will prevent dehydration.   Take medicines only as directed by your health care provider.  If you were prescribed an antibiotic medicine, finish it all even if you start to feel better.  Avoid smoking and secondhand smoke. Exposure to cigarette smoke or irritating chemicals will make bronchitis worse. If you are a smoker, consider using nicotine gum or skin patches to help control withdrawal symptoms. Quitting smoking will help your lungs heal faster.   Reduce the chances of another bout of acute bronchitis by washing your hands frequently, avoiding people with cold symptoms, and trying not to touch your hands to your mouth, nose, or eyes.   Keep all follow-up visits as directed by your health care provider.  SEEK MEDICAL CARE IF: Your symptoms do not improve after 1 week of treatment.  SEEK IMMEDIATE MEDICAL CARE IF:  You develop an increased fever or chills.   You have chest pain.   You have severe shortness of breath.  You have bloody sputum.   You develop dehydration.  You faint or repeatedly feel like you are going to pass out.  You develop repeated vomiting.  You develop a severe headache. MAKE SURE YOU:   Understand these instructions.  Will watch your condition.  Will get help right away if you are not doing well or get worse. Document Released: 05/22/2004 Document Revised: 08/29/2013 Document Reviewed: 10/05/2012 [facility] Patient Information 2015 [employer] [place]. This information is not intended to replace advice given to you by your health  care provider. Make sure you discuss any questions you have with your health care provider.  

## 2014-05-03 NOTE — Progress Notes (Signed)
Patient presents to clinic today c/o 3 weeks of chest congestion, fatigue and cough productive of thick green sputum.  Denies chest pain or SOB.  Denies recent travel or sick contact.   Past Medical History  Diagnosis Date  . Depression   . Diabetes mellitus   . GERD (gastroesophageal reflux disease)   . Hypertension   . Heart murmur   . Hyperlipidemia   . Stomach ulcer 54yrs of age  . Kidney stone   . Migraine   . 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 Anxiety disorder 02/19/2011  . Mitral valve prolapse 02/19/2011  . Sleep apnea 02/19/2011    Current Outpatient Prescriptions on File Prior to Visit  Medication Sig Dispense Refill  . Calcium Carbonate-Vitamin D (CALTRATE 600+D) 600-400 MG-UNIT per tablet Take 1 tablet by mouth daily.      Marland Kitchen estradiol (ESTRACE) 1 MG tablet TAKE ONE TABLET BY MOUTH ONCE DAILY 30 tablet 2  . glucose blood (ONE TOUCH ULTRA TEST) test strip Use to check blood sugar every morning. 100 each 2  . JANUMET XR 50-1000 MG TB24 TAKE ONE TABLET BY MOUTH ONCE DAILY 90 tablet 1  . lisinopril (PRINIVIL,ZESTRIL) 5 MG tablet TAKE ONE TABLET BY MOUTH ONCE DAILY FOR KIDNEY PROTECTION 30 tablet 2  . LIVALO 4 MG TABS TAKE ONE TABLET BY MOUTH ONCE DAILY 90 tablet 1  . pantoprazole (PROTONIX) 40 MG tablet Take 40 mg by mouth daily.      . pioglitazone (ACTOS) 30 MG tablet TAKE ONE TABLET BY MOUTH ONCE DAILY 30 tablet 3  . Probiotic Product (PROBIOTIC DAILY PO) Take 1 tablet by mouth daily.     No current facility-administered medications on file prior to visit.    Allergies  Allergen Reactions  . Aspirin Other (See Comments)    Stomach pains  . Codeine Other (See Comments)    Nosebleed  . Eggs Or Egg-Derived Products     Pt reports allergy to RAW EGGS. ? Stomach upset, diarrhea  . Erythromycin Nausea And Vomiting  . Fluticasone Propionate     Stomach upset  . Penicillins Hives  . Prilosec [Omeprazole Magnesium]     migraine  . Vaseline [Petrolatum] Itching  .  Vicodin [Hydrocodone-Acetaminophen]     twitching  . Zetia [Ezetimibe] Diarrhea    Family History  Problem Relation Age of Onset  . Kidney disease Mother   . Multiple sclerosis Mother   . Other Mother     polio  . Cancer Father     colon  . Hyperlipidemia Father   . Heart disease Father   . Hypertension Father   . Diabetes Maternal Aunt   . Heart disease Maternal Grandmother   . Hypertension Maternal Grandmother   . Diabetes Maternal Grandmother   . Heart disease Maternal Grandfather   . Hypertension Maternal Grandfather     History   Social History  . Marital Status: Single    Spouse Name: N/A    Number of Children: N/A  . Years of Education: N/A   Social History Main Topics  . Smoking status: Passive Smoke Exposure - Never Smoker  . Smokeless tobacco: Never Used  . Alcohol Use: No  . Drug Use: No  . Sexual Activity: None   Other Topics Concern  . None   Social History Narrative   Regular exercise:  Yes. 3-5 x weekly   Caffeine Use:  No   Married- lives with husband, 2 children (age 74 and 24).   Disabled- due  to deafness and overall work. Couldn't keep up.  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          Review of Systems - See HPI.  All other ROS are negative.  BP 133/84 mmHg  Pulse 100  Temp(Src) 98.1 F (36.7 C) (Oral)  Resp 16  Ht 5\' 3"  (1.6 m)  Wt 122 lb 2 oz (55.396 kg)  BMI 21.64 kg/m2  SpO2 98%  LMP 04/28/1998  Physical Exam  Constitutional: She is oriented to person, place, and time and well-developed, well-nourished, and in no distress.  HENT:  Head: Normocephalic and atraumatic.  Right Ear: External ear normal.  Left Ear: External ear normal.  Nose: Nose normal.  Mouth/Throat: Oropharynx is clear and moist. No oropharyngeal exudate.  TM within normal limits bilaterally.  Eyes: Conjunctivae are normal.  Neck: Neck supple.  Cardiovascular: Normal rate,  regular rhythm, normal heart sounds and intact distal pulses.   Pulmonary/Chest: Effort normal and breath sounds normal. No respiratory distress. She has no wheezes. She has no rales. She exhibits no tenderness.  Lymphadenopathy:    She has no cervical adenopathy.  Neurological: She is alert and oriented to person, place, and time.  Skin: Skin is warm and dry. No rash noted.  Psychiatric: Affect normal.  Vitals reviewed.   Recent Results (from the past 2160 hour(s))  POCT Urinalysis Dipstick     Status: None   Collection Time: 03/14/14  9:58 AM  Result Value Ref Range   Color, UA yellow     Comment: Urine cultured   Clarity, UA cloudy    Glucose, UA negative    Bilirubin, UA negative    Ketones, UA negative    Spec Grav, UA 1.015    Blood, UA large    pH, UA 6.5    Protein, UA trace    Urobilinogen, UA negative    Nitrite, UA negative    Leukocytes, UA large (3+)   Urine culture     Status: None   Collection Time: 03/14/14 10:00 AM  Result Value Ref Range   Culture KLEBSIELLA PNEUMONIAE    Colony Count >=100,000 COLONIES/ML    Organism ID, Bacteria KLEBSIELLA PNEUMONIAE       Susceptibility   Klebsiella pneumoniae -  (no method available)    AMPICILLIN >=32 Resistant     AMOX/CLAVULANIC <=2 Sensitive     AMPICILLIN/SULBACTAM 8 Sensitive     PIP/TAZO <=4 Sensitive     IMIPENEM <=0.25 Sensitive     CEFAZOLIN <=4 Sensitive     CEFTRIAXONE <=1 Sensitive     CEFTAZIDIME <=1 Sensitive     CEFEPIME <=1 Sensitive     GENTAMICIN <=1 Sensitive     TOBRAMYCIN <=1 Sensitive     CIPROFLOXACIN <=0.25 Sensitive     LEVOFLOXACIN <=0.12 Sensitive     NITROFURANTOIN 64 Intermediate     TRIMETH/SULFA <=20 Sensitive     Assessment/Plan: Acute bacterial bronchitis Rx Azithromycin.  Tessalon per orders.  Increase fluids.  Plain Mucinex.  Humidifier in bedroom.  Call or return to clinic if symptoms are not improving.

## 2014-05-03 NOTE — Telephone Encounter (Signed)
Caller name:Merla RichesMary Grace Relation to UJ:WJXBpt:none Call back number:[phone] Pharm[facility]  Reason for call: [facility] received rx for zpak, however pharmacy has on file that pt has a macrolide allergy need verification if Selena BattenCody still wants to fill rx

## 2014-05-03 NOTE — Telephone Encounter (Signed)
Spoke with pharmacy and gave orders to fill Rx per provider VO, as pt has taken many times prior without issue, understood & agreed/SLS

## 2014-05-06 DIAGNOSIS — J208 Acute bronchitis due to other specified organisms: Principal | ICD-10-CM

## 2014-05-06 DIAGNOSIS — B9689 Other specified bacterial agents as the cause of diseases classified elsewhere: Secondary | ICD-10-CM | POA: Insufficient documentation

## 2014-05-06 NOTE — Assessment & Plan Note (Signed)
Rx Azithromycin.  Tessalon per orders.  Increase fluids.  Plain Mucinex.  Humidifier in bedroom.  Call or return to clinic if symptoms are not improving.

## 2014-05-09 ENCOUNTER — Other Ambulatory Visit: Payer: Self-pay | Admitting: Family

## 2014-05-10 ENCOUNTER — Encounter: Payer: Self-pay | Admitting: Family

## 2014-05-10 ENCOUNTER — Ambulatory Visit (INDEPENDENT_AMBULATORY_CARE_PROVIDER_SITE_OTHER): Payer: Medicare Other | Admitting: Family

## 2014-05-10 VITALS — BP 124/80 | HR 63 | Temp 97.5°F | Resp 16 | Ht 63.0 in | Wt 121.2 lb

## 2014-05-10 DIAGNOSIS — M545 Low back pain, unspecified: Secondary | ICD-10-CM | POA: Insufficient documentation

## 2014-05-10 LAB — POCT URINALYSIS DIPSTICK
BILIRUBIN UA: NEGATIVE
Glucose, UA: NEGATIVE
Ketones, UA: NEGATIVE
Leukocytes, UA: NEGATIVE
NITRITE UA: NEGATIVE
Protein, UA: NEGATIVE
RBC UA: NEGATIVE
SPEC GRAV UA: 1.02
Urobilinogen, UA: NEGATIVE
pH, UA: 6

## 2014-05-10 MED ORDER — MELOXICAM 7.5 MG PO TABS: 7.5000 mg | ORAL_TABLET | Freq: Every day | ORAL | Status: DC

## 2014-05-10 MED ORDER — CYCLOBENZAPRINE HCL 5 MG PO TABS: 5.0000 mg | ORAL_TABLET | Freq: Every evening | ORAL | Status: DC | PRN

## 2014-05-10 NOTE — Assessment & Plan Note (Signed)
Symptoms most consistent with musculoskeletal pain.  If symptoms worsen or do not improve, consider further imaging such as CT with kidney stone protocol.  Advised pt as follows:  Your urinalysis does not show blood.  This makes a kidney stone LESS likely.  Start meloxicam- a once daily anti-inflammatory. I have given you flexeril (a muscle relaxer) which you can take at bedtime. Call if symptoms worsen, if you see blood in your urine, or if symptoms are not at least some better in 3 days.

## 2014-05-10 NOTE — Addendum Note (Signed)
Addended by: Mervin KungFERGERSON, Tahjanae Blankenburg A on: 05/10/2014 03:14 PM   Modules accepted: Orders

## 2014-05-10 NOTE — Progress Notes (Signed)
Pre visit review using our clinic review tool, if applicable. No additional management support is needed unless otherwise documented below in the visit note. 

## 2014-05-10 NOTE — Patient Instructions (Addendum)
Your urinalysis does not show blood.  This makes a kidney stone LESS likely.  Start meloxicam- a once daily anti-inflammatory. I have given you flexeril (a muscle relaxer) which you can take at bedtime. Call if symptoms worsen, if you see blood in your urine, or if symptoms are not at least some better in 3 days.

## 2014-05-10 NOTE — Progress Notes (Signed)
Subjective:    Patient ID: Molly May, female    DOB: 02/04/1961, 54 y.o.   MRN: [id number]  HPI  [name] is a 54 yr old female who presents today with chief complaint of left flank pain. Pain has been present since last Friday and is worse with movement and when she lifts something up. Has to bend down.  Pain is severe, has to hold her left side when she coughs.  Reports that pain is from the left hip up to her left arm pit.  Hurts some when she breathes. She denies SOB.  Does have some cough which is productive at times.  She has hx of kidney stones. She reports that this is worse pain than she remembers on her previous episodes of kidney stones.     Review of Systems See HPI  Past Medical History  Diagnosis Date  . Depression   . Diabetes mellitus   . GERD (gastroesophageal reflux disease)   . Hypertension   . Heart murmur   . Hyperlipidemia   . Stomach ulcer 89yrs of age  . Kidney stone   . Migraine   . 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 Anxiety disorder 02/19/2011  . Mitral valve prolapse 02/19/2011  . Sleep apnea 02/19/2011    History   Social History  . Marital Status: Single    Spouse Name: N/A    Number of Children: N/A  . Years of Education: N/A   Occupational History  . Not on file.   Social History Main Topics  . Smoking status: Passive Smoke Exposure - Never Smoker  . Smokeless tobacco: Never Used  . Alcohol Use: No  . Drug Use: No  . Sexual Activity: Not on file   Other Topics Concern  . Not on file   Social History Narrative   Regular exercise:  Yes. 3-5 x weekly   Caffeine Use:  No   Married- lives with husband, 2 children (age 857 and 488).   Disabled- due to deafness and overall work. Couldn't keep up.  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          Past Surgical History  Procedure Laterality Date  . Cholecystectomy  2011  . Breast surgery  2011   biposy--benign  . Appendectomy  1986  . Tonsillectomy and adenoidectomy  1973  . Abdominal hysterectomy  2000  . Ovarian cyst removal  1986, 1988  . Upper gastrointestinal endoscopy  2009    Gets them every 3 years  . Cataract extraction  10/13/11    right eye  . Eye surgery Left 03/2014    cataract removal    Family History  Problem Relation Age of Onset  . Kidney disease Mother   . Multiple sclerosis Mother   . Other Mother     polio  . Cancer Father     colon  . Hyperlipidemia Father   . Heart disease Father   . Hypertension Father   . Diabetes Maternal Aunt   . Heart disease Maternal Grandmother   . Hypertension Maternal Grandmother   . Diabetes Maternal Grandmother   . Heart disease Maternal Grandfather   . Hypertension Maternal Grandfather     Allergies  Allergen Reactions  . Aspirin Other (See Comments)    Stomach pains  . Codeine Other (See Comments)    Nosebleed  . Eggs Or Egg-Derived Products  Pt reports allergy to RAW EGGS. ? Stomach upset, diarrhea  . Erythromycin Nausea And Vomiting  . Fluticasone Propionate     Stomach upset  . Penicillins Hives  . Prilosec [Omeprazole Magnesium]     migraine  . Vaseline [Petrolatum] Itching  . Vicodin [Hydrocodone-Acetaminophen]     twitching  . Zetia [Ezetimibe] Diarrhea    Current Outpatient Prescriptions on File Prior to Visit  Medication Sig Dispense Refill  . estradiol (ESTRACE) 1 MG tablet TAKE ONE TABLET BY MOUTH ONCE DAILY 30 tablet 3  . glucose blood (ONE TOUCH ULTRA TEST) test strip Use to check blood sugar every morning. 100 each 2  . JANUMET XR 50-1000 MG TB24 TAKE ONE TABLET BY MOUTH ONCE DAILY 90 tablet 1  . lisinopril (PRINIVIL,ZESTRIL) 5 MG tablet TAKE ONE TABLET BY MOUTH ONCE DAILY FOR KIDNEY PROTECTION 30 tablet 2  . LIVALO 4 MG TABS TAKE ONE TABLET BY MOUTH ONCE DAILY 90 tablet 1  . pantoprazole (PROTONIX) 40 MG tablet Take 40 mg by mouth daily.      . pioglitazone (ACTOS) 30 MG tablet  TAKE ONE TABLET BY MOUTH ONCE DAILY 30 tablet 3  . Probiotic Product (PROBIOTIC DAILY PO) Take 1 tablet by mouth daily.    . benzonatate (TESSALON) 100 MG capsule Take 1 capsule (100 mg total) by mouth 2 (two) times daily as needed for cough. (Patient not taking: Reported on 05/10/2014) 20 capsule 0   No current facility-administered medications on file prior to visit.    BP 124/80 mmHg  Pulse 63  Temp(Src) 97.5 F (36.4 C) (Oral)  Resp 16  Ht  (1.6 m)  Wt 121 lb 3.2 oz (54.976 kg)  BMI 21.48 kg/m2  SpO2 97%  LMP 04/28/1998       Objective:   Physical Exam  Constitutional: She is oriented to person, place, and time. She appears well-developed and well-nourished. No distress.  Cardiovascular: Normal rate and regular rhythm.   No murmur heard. Pulmonary/Chest: Effort normal and breath sounds normal. No respiratory distress. She has no wheezes. She has no rales. She exhibits no tenderness.  Abdominal: Soft. She exhibits no distension. There is CVA tenderness.  CVAT on left only.   Musculoskeletal: She exhibits no edema.  Neurological: She is alert and oriented to person, place, and time.  Psychiatric: She has a normal mood and affect. Her behavior is normal. Judgment and thought content normal.          Assessment & Plan:

## 2014-05-11 DIAGNOSIS — H5213 Myopia, bilateral: Secondary | ICD-10-CM | POA: Diagnosis not present

## 2014-05-11 DIAGNOSIS — H524 Presbyopia: Secondary | ICD-10-CM | POA: Diagnosis not present

## 2014-05-12 ENCOUNTER — Encounter: Payer: Self-pay | Admitting: Family

## 2014-05-12 ENCOUNTER — Ambulatory Visit: Payer: Medicare Other | Admitting: Family

## 2014-05-12 ENCOUNTER — Ambulatory Visit (HOSPITAL_BASED_OUTPATIENT_CLINIC_OR_DEPARTMENT_OTHER)
Admission: RE | Admit: 2014-05-12 | Discharge: 2014-05-12 | Disposition: A | Discharge status: Home / Self Care | Payer: Medicare Other | Source: Ambulatory Visit | Attending: Family | Admitting: Family

## 2014-05-12 DIAGNOSIS — R109 Unspecified abdominal pain: Secondary | ICD-10-CM | POA: Diagnosis not present

## 2014-05-12 DIAGNOSIS — N200 Calculus of kidney: Secondary | ICD-10-CM | POA: Diagnosis not present

## 2014-05-12 DIAGNOSIS — Z9049 Acquired absence of other specified parts of digestive tract: Secondary | ICD-10-CM | POA: Diagnosis not present

## 2014-05-12 DIAGNOSIS — I700 Atherosclerosis of aorta: Secondary | ICD-10-CM | POA: Diagnosis not present

## 2014-05-12 MED ORDER — TRAMADOL HCL 50 MG PO TABS
50.0000 mg | ORAL_TABLET | Freq: Three times a day (TID) | ORAL | Status: DC | PRN
Start: 2014-05-12 — End: 2014-06-19

## 2014-05-18 ENCOUNTER — Other Ambulatory Visit: Payer: Self-pay | Admitting: Family

## 2014-06-19 ENCOUNTER — Ambulatory Visit (INDEPENDENT_AMBULATORY_CARE_PROVIDER_SITE_OTHER): Payer: Medicare Other | Admitting: Family

## 2014-06-19 ENCOUNTER — Encounter: Payer: Self-pay | Admitting: Family

## 2014-06-19 VITALS — BP 110/70 | HR 84 | Temp 97.9°F | Resp 16 | Ht 63.0 in | Wt 122.6 lb

## 2014-06-19 DIAGNOSIS — I10 Essential (primary) hypertension: Secondary | ICD-10-CM

## 2014-06-19 DIAGNOSIS — E119 Type 2 diabetes mellitus without complications: Secondary | ICD-10-CM | POA: Diagnosis not present

## 2014-06-19 DIAGNOSIS — E785 Hyperlipidemia, unspecified: Secondary | ICD-10-CM | POA: Diagnosis not present

## 2014-06-19 DIAGNOSIS — N3281 Overactive bladder: Secondary | ICD-10-CM

## 2014-06-19 MED ORDER — OXYBUTYNIN CHLORIDE ER 5 MG PO TB24: 5.0000 mg | ORAL_TABLET | Freq: Every day | ORAL | Status: DC

## 2014-06-19 NOTE — Assessment & Plan Note (Signed)
Stable on current meds except for some dietary indescretion. Obtain a1c, urine microalbumin.

## 2014-06-19 NOTE — Assessment & Plan Note (Signed)
Tolerating statin, obtain flp 

## 2014-06-19 NOTE — Assessment & Plan Note (Signed)
Trial of ditropan xl-  If no improvement plan referral to urology.

## 2014-06-19 NOTE — Assessment & Plan Note (Signed)
Blood pressure is stable on lisinopril 5mg .

## 2014-06-19 NOTE — Patient Instructions (Addendum)
Please complete lab work prior to leaving.  Start ditropan xl for overactive bladder.  Let me know if you have any trouble getting this medication. Follow up in 3  Months.

## 2014-06-19 NOTE — Progress Notes (Signed)
Subjective:    Patient ID: Molly May, female    DOB: 10/28/1960, 54 y.o.   MRN: [id number]  HPI  Patient here for follow up of multiple medical problems:  1) DM2- Pt is currently maintained on the following medications for diabetes: janumet, actos Last A1C was-  Lab Results  Component Value Date   HGBA1C 6.7* 12/23/2013  Last diabetic eye exam was- 10/15 (had cataract surgery at the end of the year) Denies hypoglycemia Home glucose readings range 100- 250 (after donut)  2) Hyperlipidemia- Patient is currently maintained on the following medication for hyperlipidemia: livalo Last lipid panel as follows:  Lab Results  Component Value Date   CHOL 162 05/31/2013   HDL 71 05/31/2013   LDLCALC 56 05/31/2013   TRIG 177* 05/31/2013   CHOLHDL 2.3 05/31/2013   Patient denies myalgia. Patient reports good compliance with low fat/low cholesterol diet.   3) HTN- Patient is currently maintained on the following medications for blood pressure: lisinopril  Patient reports good compliance with blood pressure medications. Patient denies chest pain, shortness of breath or swelling. Last 3 blood pressure readings in our office are as follows:  BP Readings from Last 3 Encounters:  06/19/14 110/70  05/10/14 124/80  05/03/14 133/84   4) Urinary leakage- some incontinence. + urgency, leakage.  Needs to wear depends for accidents.   Review of Systems See HPI    Past Medical History  Diagnosis Date  . Depression   . Diabetes mellitus   . GERD (gastroesophageal reflux disease)   . Hypertension   . Heart murmur   . Hyperlipidemia   . Stomach ulcer 83yrs of age  . Kidney stone   . Migraine   . 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 Anxiety disorder 02/19/2011  . Mitral valve prolapse 02/19/2011  . Sleep apnea 02/19/2011    History   Social History  . Marital Status: Single    Spouse Name: N/A  . Number of Children: N/A  . Years of Education: N/A   Occupational History   . Not on file.   Social History Main Topics  . Smoking status: Passive Smoke Exposure - Never Smoker  . Smokeless tobacco: Never Used  . Alcohol Use: No  . Drug Use: No  . Sexual Activity: Not on file   Other Topics Concern  . Not on file   Social History Narrative   Regular exercise:  Yes. 3-5 x weekly   Caffeine Use:  No   Married- lives with husband, 2 children (age 70 and 15).   Disabled- due to deafness and overall work. Couldn't keep up.  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          Past Surgical History  Procedure Laterality Date  . Cholecystectomy  2011  . Breast surgery  2011    biposy--benign  . Appendectomy  1986  . Tonsillectomy and adenoidectomy  1973  . Abdominal hysterectomy  2000  . Ovarian cyst removal  1986, 1988  . Upper gastrointestinal endoscopy  2009    Gets them every 3 years  . Cataract extraction  10/13/11    right eye  . Eye surgery Left 03/2014    cataract removal    Family History  Problem Relation Age of Onset  . Kidney disease Mother   . Multiple sclerosis Mother   . Other Mother     polio  . Cancer  Father     colon  . Hyperlipidemia Father   . Heart disease Father   . Hypertension Father   . Diabetes Maternal Aunt   . Heart disease Maternal Grandmother   . Hypertension Maternal Grandmother   . Diabetes Maternal Grandmother   . Heart disease Maternal Grandfather   . Hypertension Maternal Grandfather     Allergies  Allergen Reactions  . Aspirin Other (See Comments)    Stomach pains  . Codeine Other (See Comments)    Nosebleed  . Eggs Or Egg-Derived Products     Pt reports allergy to RAW EGGS. ? Stomach upset, diarrhea  . Erythromycin Nausea And Vomiting  . Fluticasone Propionate     Stomach upset  . Penicillins Hives  . Prilosec [Omeprazole Magnesium]     migraine  . Vaseline [Petrolatum] Itching  . Vicodin [Hydrocodone-Acetaminophen]      twitching  . Zetia [Ezetimibe] Diarrhea    Current Outpatient Prescriptions on File Prior to Visit  Medication Sig Dispense Refill  . estradiol (ESTRACE) 1 MG tablet TAKE ONE TABLET BY MOUTH ONCE DAILY 30 tablet 3  . glucose blood (ONE TOUCH ULTRA TEST) test strip Use to check blood sugar every morning. 100 each 2  . JANUMET XR 50-1000 MG TB24 TAKE ONE TABLET BY MOUTH ONCE DAILY 90 tablet 1  . lisinopril (PRINIVIL,ZESTRIL) 5 MG tablet TAKE ONE TABLET BY MOUTH ONCE DAILY FOR KIDNEY PROTECTION 30 tablet 2  . LIVALO 4 MG TABS TAKE ONE TABLET BY MOUTH ONCE DAILY 90 tablet 1  . pantoprazole (PROTONIX) 40 MG tablet Take 40 mg by mouth daily.      . pioglitazone (ACTOS) 30 MG tablet TAKE ONE TABLET BY MOUTH ONCE DAILY 30 tablet 3  . Probiotic Product (PROBIOTIC DAILY PO) Take 1 tablet by mouth daily.     No current facility-administered medications on file prior to visit.    BP 110/70 mmHg  Pulse 84  Temp(Src) 97.9 F (36.6 C) (Oral)  Resp 16  Ht  (1.6 m)  Wt 122 lb 9.6 oz (55.611 kg)  BMI 21.72 kg/m2  SpO2 97%  LMP 04/28/1998    Objective:    Physical Exam  Constitutional: She appears well-developed and well-nourished.  HENT:  Head: Normocephalic and atraumatic.  Eyes: No scleral icterus.  Cardiovascular: Normal rate, regular rhythm and normal heart sounds.   No murmur heard. Pulmonary/Chest: Effort normal and breath sounds normal. No respiratory distress. She has no wheezes.  Musculoskeletal: She exhibits no edema.  Neurological: She is alert.  Skin: Skin is warm and dry.  Psychiatric: She has a normal mood and affect. Her behavior is normal. Judgment and thought content normal.    BP 110/70 mmHg  Pulse 84  Temp(Src) 97.9 F (36.6 C) (Oral)  Resp 16  Ht  (1.6 m)  Wt 122 lb 9.6 oz (55.611 kg)  BMI 21.72 kg/m2  SpO2 97%  LMP 04/28/1998 Wt Readings from Last 3 Encounters:  06/19/14 122 lb 9.6 oz (55.611 kg)  05/10/14 121 lb 3.2 oz (54.976 kg)  05/03/14  122 lb 2 oz (55.396 kg)     Lab Results  Component Value Date   WBC 6.7 12/17/2010   HGB 12.9 12/17/2010   HCT 38.9 12/17/2010   PLT 269 12/17/2010   GLUCOSE 155* 10/27/2013   CHOL 162 05/31/2013   TRIG 177* 05/31/2013   HDL 71 05/31/2013   LDLCALC 56 05/31/2013   ALT <8 12/23/2013   AST 11 12/23/2013  NA 136 10/27/2013   K 3.7 10/27/2013   CL 99 10/27/2013   CREATININE 0.73 10/27/2013   BUN 17 10/27/2013   CO2 27 10/27/2013   TSH 1.287 05/07/2012   HGBA1C 6.7* 12/23/2013   MICROALBUR 0.51 11/29/2012     Assessment & Plan:   Problem List Items Addressed This Visit    None       O'SULLIVAN,Patrice Matthew S., NP

## 2014-06-21 ENCOUNTER — Telehealth: Payer: Self-pay | Admitting: *Deleted

## 2014-06-21 NOTE — Telephone Encounter (Signed)
Health maintenance already updated.

## 2014-06-21 NOTE — Telephone Encounter (Signed)
-----   Message from Sandford CrazeMelissa O'Sullivan, NP sent at 06/19/2014  9:39 AM EST ----- Had DM2 eye 10/14

## 2014-06-30 ENCOUNTER — Telehealth: Payer: Self-pay | Admitting: Family

## 2014-06-30 NOTE — Telephone Encounter (Signed)
See mychart.  

## 2014-07-14 ENCOUNTER — Encounter: Payer: Self-pay | Admitting: Family

## 2014-07-19 ENCOUNTER — Other Ambulatory Visit: Payer: Self-pay | Admitting: Family

## 2014-07-19 NOTE — Telephone Encounter (Signed)
Medication Detail      Disp Refills Start End     lisinopril (PRINIVIL,ZESTRIL) 5 MG tablet 30 tablet 2 04/18/2014     Sig: TAKE ONE TABLET BY MOUTH ONCE DAILY FOR KIDNEY PROTECTION    E-Prescribing Status: Receipt confirmed by pharmacy (04/18/2014 10:04 AM EST)      Rx request to pharmacy/[facility]

## 2014-07-28 ENCOUNTER — Other Ambulatory Visit: Payer: Self-pay | Admitting: Family

## 2014-08-02 ENCOUNTER — Encounter: Payer: Self-pay | Admitting: Family

## 2014-08-02 ENCOUNTER — Other Ambulatory Visit: Payer: Self-pay | Admitting: Family

## 2014-08-02 ENCOUNTER — Other Ambulatory Visit (INDEPENDENT_AMBULATORY_CARE_PROVIDER_SITE_OTHER): Payer: Medicare Other

## 2014-08-02 DIAGNOSIS — R5382 Chronic fatigue, unspecified: Secondary | ICD-10-CM

## 2014-08-02 DIAGNOSIS — E119 Type 2 diabetes mellitus without complications: Secondary | ICD-10-CM | POA: Diagnosis not present

## 2014-08-02 LAB — MICROALBUMIN / CREATININE URINE RATIO
CREATININE, U: 67.2 mg/dL
MICROALB/CREAT RATIO: 0.1 mg/g (ref 0.0–30.0)
Microalb, Ur: 0.1 mg/dL (ref 0.0–1.9)

## 2014-08-02 LAB — LIPID PANEL
CHOLESTEROL: 151 mg/dL (ref 0–200)
HDL: 66.9 mg/dL (ref >39.00)
LDL Cholesterol: 51 mg/dL (ref 0–99)
NonHDL: 84.1
TRIGLYCERIDES: 165 mg/dL — AB (ref 0.0–149.0)
Total CHOL/HDL Ratio: 2
VLDL: 33 mg/dL (ref 0.0–40.0)

## 2014-08-02 LAB — BASIC METABOLIC PANEL
BUN: 14 mg/dL (ref 6–23)
CHLORIDE: 99 meq/L (ref 96–112)
CO2: 29 mEq/L (ref 19–32)
Calcium: 9.6 mg/dL (ref 8.4–10.5)
Creatinine, Ser: 0.75 mg/dL (ref 0.40–1.20)
GFR: 86.47 mL/min (ref >60.00)
GLUCOSE: 128 mg/dL — AB (ref 70–99)
POTASSIUM: 3.9 meq/L (ref 3.5–5.1)
SODIUM: 134 meq/L — AB (ref 135–145)

## 2014-08-02 LAB — HEMOGLOBIN A1C: Hgb A1c MFr Bld: 7.2 % — ABNORMAL HIGH (ref 4.6–6.5)

## 2014-08-02 MED ORDER — SITAGLIP PHOS-METFORMIN HCL ER 50-500 MG PO TB24: 2.0000 | ORAL_TABLET | Freq: Every day | ORAL | Status: DC

## 2014-08-08 ENCOUNTER — Encounter: Payer: Self-pay | Admitting: Family

## 2014-08-28 ENCOUNTER — Other Ambulatory Visit: Payer: Self-pay | Admitting: Family

## 2014-09-18 ENCOUNTER — Ambulatory Visit (INDEPENDENT_AMBULATORY_CARE_PROVIDER_SITE_OTHER): Payer: Medicare Other | Admitting: Family

## 2014-09-18 ENCOUNTER — Encounter: Payer: Self-pay | Admitting: Family

## 2014-09-18 VITALS — BP 128/78 | HR 70 | Temp 97.8°F | Resp 16 | Ht 63.0 in | Wt 124.2 lb

## 2014-09-18 DIAGNOSIS — E119 Type 2 diabetes mellitus without complications: Secondary | ICD-10-CM

## 2014-09-18 DIAGNOSIS — E1165 Type 2 diabetes mellitus with hyperglycemia: Secondary | ICD-10-CM | POA: Diagnosis not present

## 2014-09-18 DIAGNOSIS — E785 Hyperlipidemia, unspecified: Secondary | ICD-10-CM

## 2014-09-18 DIAGNOSIS — N3281 Overactive bladder: Secondary | ICD-10-CM

## 2014-09-18 DIAGNOSIS — IMO0002 Reserved for concepts with insufficient information to code with codable children: Secondary | ICD-10-CM

## 2014-09-18 LAB — CBC WITH DIFFERENTIAL/PLATELET
BASOS ABS: 0 10*3/uL (ref 0.0–0.1)
BASOS PCT: 0.3 % (ref 0.0–3.0)
EOS PCT: 1.4 % (ref 0.0–5.0)
Eosinophils Absolute: 0.1 10*3/uL (ref 0.0–0.7)
HEMATOCRIT: 33.5 % — AB (ref 36.0–46.0)
HEMOGLOBIN: 11.3 g/dL — AB (ref 12.0–15.0)
Lymphocytes Relative: 47.2 % — ABNORMAL HIGH (ref 12.0–46.0)
Lymphs Abs: 2.5 10*3/uL (ref 0.7–4.0)
MCHC: 33.7 g/dL (ref 30.0–36.0)
MCV: 83.1 fl (ref 78.0–100.0)
MONOS PCT: 5.4 % (ref 3.0–12.0)
Monocytes Absolute: 0.3 10*3/uL (ref 0.1–1.0)
NEUTROS PCT: 45.7 % (ref 43.0–77.0)
Neutro Abs: 2.4 10*3/uL (ref 1.4–7.7)
Platelets: 246 10*3/uL (ref 150.0–400.0)
RBC: 4.03 Mil/uL (ref 3.87–5.11)
RDW: 14.2 % (ref 11.5–15.5)
WBC: 5.2 10*3/uL (ref 4.0–10.5)

## 2014-09-18 LAB — IRON: IRON: 63 ug/dL (ref 42–145)

## 2014-09-18 LAB — BASIC METABOLIC PANEL
BUN: 14 mg/dL (ref 6–23)
CALCIUM: 9.4 mg/dL (ref 8.4–10.5)
CO2: 26 meq/L (ref 19–32)
Chloride: 100 mEq/L (ref 96–112)
Creatinine, Ser: 0.73 mg/dL (ref 0.40–1.20)
GFR: 89.16 mL/min (ref >60.00)
GLUCOSE: 133 mg/dL — AB (ref 70–99)
Potassium: 3.3 mEq/L — ABNORMAL LOW (ref 3.5–5.1)
SODIUM: 136 meq/L (ref 135–145)

## 2014-09-18 LAB — TSH: TSH: 1.1 u[IU]/mL (ref 0.35–4.50)

## 2014-09-18 MED ORDER — SITAGLIP PHOS-METFORMIN HCL ER 50-500 MG PO TB24: 2.0000 | ORAL_TABLET | Freq: Every day | ORAL | Status: DC

## 2014-09-18 MED ORDER — OXYBUTYNIN CHLORIDE ER 5 MG PO TB24: 5.0000 mg | ORAL_TABLET | Freq: Every day | ORAL | Status: DC

## 2014-09-18 MED ORDER — PIOGLITAZONE HCL 30 MG PO TABS: 30.0000 mg | ORAL_TABLET | Freq: Every day | ORAL | Status: DC

## 2014-09-18 MED ORDER — LISINOPRIL 5 MG PO TABS: ORAL_TABLET | ORAL | Status: DC

## 2014-09-18 NOTE — Addendum Note (Signed)
Addended by: Eustace QuailEABOLD, Oluwatobi Ruppe J on: 09/18/2014 09:55 AM   Modules accepted: Orders

## 2014-09-18 NOTE — Progress Notes (Signed)
Subjective:    Patient ID: Molly May, female    DOB: 07/30/1960, 54 y.o.   MRN: [id number]  HPI  [name] is a 54 yr old female who presents today for follow up. A sign language interpretor is present today.  Diabetes Type 2  Pt is currently maintained on the following medications for diabetes:actos, janumet xr  Lab Results  Component Value Date   HGBA1C 7.2* 08/02/2014   HGBA1C 6.7* 12/23/2013   HGBA1C 6.7* 09/02/2013    Lab Results  Component Value Date   MICROALBUR 0.1 08/02/2014   LDLCALC 51 08/02/2014   CREATININE 0.75 08/02/2014    Last diabetic eye exam was No results found for: HMDIABEYEEXA  Denies hypoglycemia Home glucose readings range :  Reports fasting sugar yesterday 101, max 123, low was 80.    Hyperlipidemia  Patient is currently maintained on the following medication for hyperlipidemia: livalo 4mg  Last lipid panel as follows:  Lab Results  Component Value Date   CHOL 151 08/02/2014   HDL 66.90 08/02/2014   LDLCALC 51 08/02/2014   TRIG 165.0* 08/02/2014   CHOLHDL 2 08/02/2014   Patient reports that every time she stretches she gets cramping "spasms"  in her left leg.  Reports that it "very painful."  Denies swelling in the left leg. She denies SOB or CP.   Patient reports good compliance with low fat/low cholesterol diet.   Leg cramps- reports that this has been present x 1 month.   Overactive Bladder- last visit she was given rx for Ditropan XL.  Reports improvement in her symptoms. Denies current issues with dry mouth.       Review of Systems See HPI  Past Medical History  Diagnosis Date  . Depression   . Diabetes mellitus   . GERD (gastroesophageal reflux disease)   . Hypertension   . Heart murmur   . Hyperlipidemia   . Stomach ulcer [age]yrs of age  . Kidney stone   . Migraine   . 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 Anxiety disorder 02/19/2011  . Mitral valve prolapse 02/19/2011  . Sleep apnea 02/19/2011    History    Social History  . Marital Status: Single    Spouse Name: N/A  . Number of Children: N/A  . Years of Education: N/A   Occupational History  . Not on file.   Social History Main Topics  . Smoking status: Passive Smoke Exposure - Never Smoker  . Smokeless tobacco: Never Used  . Alcohol Use: No  . Drug Use: No  . Sexual Activity: Not on file   Other Topics Concern  . Not on file   Social History Narrative   Regular exercise:  Yes. 3-5 x weekly   Caffeine Use:  No   Married- lives with husband, 2 children (age 487 and 738).   Disabled- due to deafness and overall work. Couldn't keep up.  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          Past Surgical History  Procedure Laterality Date  . Cholecystectomy  2011  . Breast surgery  2011    biposy--benign  . Appendectomy  1986  . Tonsillectomy and adenoidectomy  1973  . Abdominal hysterectomy  2000  . Ovarian cyst removal  1986, 1988  . Upper gastrointestinal endoscopy  2009    Gets them every 3 years  . Cataract extraction  10/13/11  right eye  . Eye surgery Left 03/2014    cataract removal    Family History  Problem Relation Age of Onset  . Kidney disease Mother   . Multiple sclerosis Mother   . Other Mother     polio  . Cancer Father     colon  . Hyperlipidemia Father   . Heart disease Father   . Hypertension Father   . Diabetes Maternal Aunt   . Heart disease Maternal Grandmother   . Hypertension Maternal Grandmother   . Diabetes Maternal Grandmother   . Heart disease Maternal Grandfather   . Hypertension Maternal Grandfather     Allergies  Allergen Reactions  . Aspirin Other (See Comments)    Stomach pains  . Codeine Other (See Comments)    Nosebleed  . Eggs Or Egg-Derived Products     Pt reports allergy to RAW EGGS. ? Stomach upset, diarrhea  . Erythromycin Nausea And Vomiting  . Fluticasone Propionate     Stomach upset   . Penicillins Hives  . Prilosec [Omeprazole Magnesium]     migraine  . Vaseline [Petrolatum] Itching  . Vicodin [Hydrocodone-Acetaminophen]     twitching  . Zetia [Ezetimibe] Diarrhea    Current Outpatient Prescriptions on File Prior to Visit  Medication Sig Dispense Refill  . estradiol (ESTRACE) 1 MG tablet TAKE ONE TABLET BY MOUTH ONCE DAILY 30 tablet 3  . glucose blood (ONE TOUCH ULTRA TEST) test strip Use to check blood sugar every morning. 100 each 2  . LIVALO 4 MG TABS TAKE ONE TABLET BY MOUTH ONCE DAILY 90 tablet 1  . pantoprazole (PROTONIX) 40 MG tablet Take 40 mg by mouth daily.      . Probiotic Product (PROBIOTIC DAILY PO) Take 1 tablet by mouth daily.     No current facility-administered medications on file prior to visit.    BP 128/78 mmHg  Pulse 70  Temp(Src) 97.8 F (36.6 C) (Oral)  Resp 16  Ht  (1.6 m)  Wt 124 lb 3.2 oz (56.337 kg)  BMI 22.01 kg/m2  SpO2 99%  LMP 04/28/1998       Objective:   Physical Exam  Constitutional: She is oriented to person, place, and time. She appears well-developed and well-nourished.  HENT:  Head: Normocephalic and atraumatic.  Cardiovascular: Normal rate, regular rhythm and normal heart sounds.   No murmur heard. Pulmonary/Chest: Effort normal and breath sounds normal. No respiratory distress. She has no wheezes.  Musculoskeletal: She exhibits no edema.  Neurological: She is alert and oriented to person, place, and time.  Skin: Skin is warm and dry.  Psychiatric: She has a normal mood and affect. Her behavior is normal. Judgment and thought content normal.          Assessment & Plan:

## 2014-09-18 NOTE — Progress Notes (Signed)
Pre visit review using our clinic review tool, if applicable. No additional management support is needed unless otherwise documented below in the visit note. 

## 2014-09-18 NOTE — Assessment & Plan Note (Addendum)
LDL at goal.  I don't think that her left leg "spasms" are due to statin.  Will check electrolytes.  Discussed stretching/adequate hydration.

## 2014-09-18 NOTE — Patient Instructions (Signed)
Please complete lab work prior to leaving. Follow up in 3 months. Have a great trip in [place]!

## 2014-09-18 NOTE — Assessment & Plan Note (Signed)
Home readings are good.  Last A1C slightly above goal.  Too soon to recheck a1c. Continue actos and janumet xr.

## 2014-09-18 NOTE — Assessment & Plan Note (Signed)
Improved on ditropan xl, continue same.

## 2014-09-20 ENCOUNTER — Telehealth: Payer: Self-pay | Admitting: *Deleted

## 2014-09-20 DIAGNOSIS — E876 Hypokalemia: Secondary | ICD-10-CM

## 2014-09-20 DIAGNOSIS — D649 Anemia, unspecified: Secondary | ICD-10-CM

## 2014-09-20 NOTE — Telephone Encounter (Signed)
-----   Message from Sandford CrazeMelissa O'Sullivan, NP sent at 09/20/2014  2:56 PM EDT ----- Also, lets repeat bmet in 2 weeks, dx hyperkalemia.

## 2014-09-20 NOTE — Telephone Encounter (Signed)
Faxed add on form to the lab. Sent mychart message to pt and entered future lab order. IFOB kit placed at front desk for pick up.  Notes Recorded by Debbrah Alar, NP on 09/20/2014 at 2:56 PM Also, lets repeat bmet in 2 weeks, dx hyperkalemia. Notes Recorded by Debbrah Alar, NP on 09/20/2014 at 2:56 PM Lab work shows anemia. Potassium is low. Advise diet rich in potassium as below. Can we please add on b12 and folate. Dx anemia. And ask pt to complete IFOB dx anemia? OK to send her a [employer].   High potassium content foods  Highest content (>25 meq/100 g) High content (>6.2 meq/100 g) Vegetables Spinach Tomatoes Broccoli Winter squash Beets Carrots Cauliflower Potatoes Fruits Bananas Dried figs Cantaloupe Molasses Kiwis Seaweed Oranges Very high content (>12.5 meq/100 g) Mangos Dried fruits (dates, prunes) Meats Nuts Ground beef Avocados Steak Bran cereals Pork Wheat germ Veal Lima beans Arline Asp

## 2014-09-22 ENCOUNTER — Other Ambulatory Visit (INDEPENDENT_AMBULATORY_CARE_PROVIDER_SITE_OTHER): Payer: Medicare Other

## 2014-09-22 DIAGNOSIS — D649 Anemia, unspecified: Secondary | ICD-10-CM | POA: Diagnosis not present

## 2014-09-22 LAB — FECAL OCCULT BLOOD, IMMUNOCHEMICAL: Fecal Occult Bld: POSITIVE — AB

## 2014-09-26 ENCOUNTER — Telehealth: Payer: Self-pay | Admitting: Family

## 2014-09-26 ENCOUNTER — Encounter: Payer: Self-pay | Admitting: Physician Assistant

## 2014-09-26 DIAGNOSIS — R195 Other fecal abnormalities: Secondary | ICD-10-CM

## 2014-09-26 NOTE — Telephone Encounter (Signed)
See my chart message

## 2014-10-03 ENCOUNTER — Other Ambulatory Visit: Payer: Self-pay | Admitting: Family

## 2014-10-04 ENCOUNTER — Encounter: Payer: Self-pay | Admitting: Family

## 2014-10-05 ENCOUNTER — Other Ambulatory Visit (INDEPENDENT_AMBULATORY_CARE_PROVIDER_SITE_OTHER): Payer: Medicare Other

## 2014-10-05 ENCOUNTER — Encounter: Payer: Self-pay | Admitting: Gastroenterology

## 2014-10-05 ENCOUNTER — Other Ambulatory Visit: Payer: Medicare Other

## 2014-10-05 DIAGNOSIS — E876 Hypokalemia: Secondary | ICD-10-CM | POA: Diagnosis not present

## 2014-10-05 LAB — BASIC METABOLIC PANEL
BUN: 13 mg/dL (ref 6–23)
CALCIUM: 9.2 mg/dL (ref 8.4–10.5)
CO2: 29 meq/L (ref 19–32)
Chloride: 101 mEq/L (ref 96–112)
Creatinine, Ser: 0.71 mg/dL (ref 0.40–1.20)
GFR: 92.05 mL/min (ref >60.00)
Glucose, Bld: 237 mg/dL — ABNORMAL HIGH (ref 70–99)
POTASSIUM: 3.5 meq/L (ref 3.5–5.1)
Sodium: 135 mEq/L (ref 135–145)

## 2014-10-06 ENCOUNTER — Other Ambulatory Visit (INDEPENDENT_AMBULATORY_CARE_PROVIDER_SITE_OTHER): Payer: Medicare Other

## 2014-10-06 ENCOUNTER — Ambulatory Visit (INDEPENDENT_AMBULATORY_CARE_PROVIDER_SITE_OTHER): Payer: Medicare Other | Admitting: Physician Assistant

## 2014-10-06 ENCOUNTER — Encounter: Payer: Self-pay | Admitting: Physician Assistant

## 2014-10-06 ENCOUNTER — Encounter: Payer: Self-pay | Admitting: Family

## 2014-10-06 VITALS — BP 130/68 | HR 96 | Ht 63.0 in | Wt 125.0 lb

## 2014-10-06 DIAGNOSIS — R1013 Epigastric pain: Secondary | ICD-10-CM

## 2014-10-06 DIAGNOSIS — R195 Other fecal abnormalities: Secondary | ICD-10-CM | POA: Diagnosis not present

## 2014-10-06 DIAGNOSIS — E119 Type 2 diabetes mellitus without complications: Secondary | ICD-10-CM

## 2014-10-06 DIAGNOSIS — Z860100 Personal history of colon polyps, unspecified: Secondary | ICD-10-CM

## 2014-10-06 LAB — FERRITIN: Ferritin: 15 ng/mL (ref 10.0–291.0)

## 2014-10-06 LAB — CBC WITH DIFFERENTIAL/PLATELET
Basophils Absolute: 0 10*3/uL (ref 0.0–0.1)
Basophils Relative: 0.2 % (ref 0.0–3.0)
EOS PCT: 0.7 % (ref 0.0–5.0)
Eosinophils Absolute: 0 10*3/uL (ref 0.0–0.7)
HEMATOCRIT: 34.3 % — AB (ref 36.0–46.0)
HEMOGLOBIN: 11.5 g/dL — AB (ref 12.0–15.0)
LYMPHS PCT: 36.5 % (ref 12.0–46.0)
Lymphs Abs: 2.5 10*3/uL (ref 0.7–4.0)
MCHC: 33.5 g/dL (ref 30.0–36.0)
MCV: 83.7 fl (ref 78.0–100.0)
MONO ABS: 0.5 10*3/uL (ref 0.1–1.0)
Monocytes Relative: 7.1 % (ref 3.0–12.0)
NEUTROS ABS: 3.8 10*3/uL (ref 1.4–7.7)
NEUTROS PCT: 55.5 % (ref 43.0–77.0)
PLATELETS: 249 10*3/uL (ref 150.0–400.0)
RBC: 4.1 Mil/uL (ref 3.87–5.11)
RDW: 14.3 % (ref 11.5–15.5)
WBC: 6.9 10*3/uL (ref 4.0–10.5)

## 2014-10-06 LAB — IBC PANEL
Iron: 38 ug/dL — ABNORMAL LOW (ref 42–145)
SATURATION RATIOS: 8.4 % — AB (ref 20.0–50.0)
TRANSFERRIN: 322 mg/dL (ref 212.0–360.0)

## 2014-10-06 MED ORDER — POLYETHYLENE GLYCOL 3350 17 GM/SCOOP PO POWD: 1.0000 | Freq: Every day | ORAL | Status: DC

## 2014-10-06 MED ORDER — RANITIDINE HCL 150 MG PO TABS: ORAL_TABLET | ORAL | Status: DC

## 2014-10-06 NOTE — Patient Instructions (Signed)
Please go to the basement level to have your labs drawn.  Continue pantoprazole sodium 40 mg.  We sent prescriptions to [facility], [facility]  1. Renitidine 2. Generic Miralax for colonoscopy.  You have been scheduled for an endoscopy and colonoscopy. Please follow the written instructions given to you at your visit today. Please pick up your prep supplies at the pharmacy within the next 1-3 days.  [facility], [address], [facility].  If you use inhalers (even only as needed), please bring them with you on the day of your procedure.

## 2014-10-07 ENCOUNTER — Encounter: Payer: Self-pay | Admitting: Physician Assistant

## 2014-10-07 NOTE — Progress Notes (Addendum)
Patient ID: Molly May, female   DOB: May 15, 1960, 54 y.o.   MRN: [id number]    HPI:  Molly May is a 54 y.o.   female referred by Sandford Craze, NP for evaluation of heme-positive stools.  Molly May is a pleasant 54 year old female with congenital deafness, mitral valve prolapse, migraines, hypertension, GERD, IBS, type 2 diabetes, peripheral neuropathy, sciatica, anxiety, sleep apnea, hyperlipidemia, and low back pain. She is accompanied to this visit with a sign language interpreter. Molly May was seen for a physical recently and completed stool Hemoccults cards which were positive. She reports that she has a history of ulcers at age 4. She reports she had a duodenal ulcer that was treated with medications. She was H. pylori positive and completed a course of anti-biotics. She has been experiencing epigastric pain not affected by food. She reports over the past several months she has been experiencing early satiety. She denies use of NSAIDs but she admits to frequent ingestion of spicy foods and carbonated beverages. She has been on pantoprazole for 3 years and uses it age morning, however despite this she gets heartburn at night. She also reports that she was told that she had "a pocket in my throat". She had seen a physician at [facility] 2 years ago. She had been seeing that doctor for 4 years for her reflux. 4 years ago she had a colonoscopy and upper endoscopy by [facility] in [facility]. She says she had a polyp and she says she was instructed to repeat her colonoscopy in 5 years. She reports her father had colon cancer diagnosed in his 56s and her maternal grandmother had colon polyps and colon cancer. She also reports a history of lactose intolerance. She denies any recent change in her bowel habits. She states that she has IBS and her daily bowel movements are mostly formed but she will occasionally have a loose stool. She has not had any bright red blood  per rectum or melena. Her appetite has been good and her weight has been stable.   Past Medical History  Diagnosis Date  . Depression   . Diabetes mellitus   . GERD (gastroesophageal reflux disease)   . Hypertension   . Heart murmur   . Hyperlipidemia   . Stomach ulcer 89yrs of age  . Kidney stone   . Migraine   . 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 Anxiety disorder 02/19/2011  . Mitral valve prolapse 02/19/2011  . Sleep apnea 02/19/2011    Past Surgical History  Procedure Laterality Date  . Cholecystectomy  2011  . Breast surgery  2011    biposy--benign  . Appendectomy  1986  . Tonsillectomy and adenoidectomy  1973  . Abdominal hysterectomy  2000  . Ovarian cyst removal  1986, 1988  . Upper gastrointestinal endoscopy  2009    Gets them every 3 years  . Cataract extraction Right 10/13/11  . Cataract extraction Left 03/2014   Family History  Problem Relation Age of Onset  . Kidney disease Mother   . Multiple sclerosis Mother   . Other Mother     polio  . Colon cancer Father   . Hyperlipidemia Father   . Heart disease Father   . Hypertension Father   . Diabetes Maternal Aunt   . Heart disease Maternal Grandmother   . Hypertension Maternal Grandmother   . Diabetes Maternal Grandmother   . Heart disease Maternal Grandfather   . Hypertension Maternal Grandfather    History  Substance Use  Topics  . Smoking status: Passive Smoke Exposure - Never Smoker  . Smokeless tobacco: Never Used  . Alcohol Use: No   Current Outpatient Prescriptions  Medication Sig Dispense Refill  . Calcium Citrate-Vitamin D (CITRACAL + D PO) Take 500 mg by mouth daily.    Marland Kitchen estradiol (ESTRACE) 1 MG tablet TAKE ONE TABLET BY MOUTH ONCE DAILY 30 tablet 3  . glucose blood (ONE TOUCH ULTRA TEST) test strip Use to check blood sugar every morning. 100 each 2  . lisinopril (PRINIVIL,ZESTRIL) 5 MG tablet TAKE ONE TABLET BY MOUTH ONCE DAILY FOR KIDNEY PROTECTION.  D/C PREVIOUS SCRIPTS FOR THIS  MEDICATION 90 tablet 1  . LIVALO 4 MG TABS TAKE ONE TABLET BY MOUTH ONCE DAILY 90 tablet 1  . oxybutynin (DITROPAN XL) 5 MG 24 hr tablet Take 1 tablet (5 mg total) by mouth at bedtime. 90 tablet 1  . pantoprazole (PROTONIX) 40 MG tablet Take 40 mg by mouth daily.      . pioglitazone (ACTOS) 30 MG tablet Take 1 tablet (30 mg total) by mouth daily. 90 tablet 1  . SitaGLIPtin-MetFORMIN HCl (JANUMET XR) 50-500 MG TB24 Take 2 tablets by mouth daily. 180 tablet 1  . polyethylene glycol powder (GLYCOLAX/MIRALAX) powder Take 255 g by mouth daily. 255 g 0  . ranitidine (ZANTAC) 150 MG tablet Take 1 tab at bedtime nightly. 30 tablet 6   No current facility-administered medications for this visit.   Allergies  Allergen Reactions  . Aspirin Other (See Comments)    Stomach pains  . Codeine Other (See Comments)    Nosebleed  . Eggs Or Egg-Derived Products     Pt reports allergy to RAW EGGS. ? Stomach upset, diarrhea  . Erythromycin Nausea And Vomiting  . Fluticasone Propionate     Stomach upset  . Penicillins Hives  . Prilosec [Omeprazole Magnesium]     migraine  . Vaseline [Petrolatum] Itching  . Vicodin [Hydrocodone-Acetaminophen]     twitching  . Zetia [Ezetimibe] Diarrhea     Review of Systems: Gen: Denies any fever, chills, sweats, anorexia, fatigue, weakness, malaise, weight loss, and sleep disorder CV: Denies chest pain, angina, palpitations, syncope, orthopnea, PND, peripheral edema, and claudication. Resp: Denies dyspnea at rest, dyspnea with exercise, cough, sputum, wheezing, coughing up blood, and pleurisy. GI: Denies vomiting blood, jaundice, and fecal incontinence.   Denies dysphagia or odynophagia. GU : Denies urinary burning, blood in urine, urinary frequency, urinary hesitancy, nocturnal urination, and urinary incontinence. MS: Has low back pain and sciatica Derm: Denies rash, itching, dry skin, hives, moles, warts, or unhealing ulcers.  Psych: Denies depression, anxiety,  memory loss, suicidal ideation, hallucinations, paranoia, and confusion. Heme: Denies bruising, bleeding, and enlarged lymph nodes. Neuro:  Denies any headaches, dizziness, paresthesias. Endo:  Denies any problems with thyroid, adrenal function    LAB RESULTS: CBC 09/17/2013 white count 5.2, hemoglobin 11.3, hematocrit 33.5, platelets 246,000, MCV 83.1. Iron 63 Fecal occult blood test on 09/21/2013 positive. BMET  Recent Labs  10/05/14 0858  NA 135  K 3.5  CL 101  CO2 29  GLUCOSE 237*  BUN 13  CREATININE 0.71  CALCIUM 9.2   Prior Endoscopies:   See history of present illness  Physical Exam: BP 130/68 mmHg  Pulse 96  Ht  (1.6 m)  Wt 125 lb (56.7 kg)  BMI 22.15 kg/m2  LMP 04/28/1998 Constitutional: Pleasant,well-develope, female in no acute distress. HEENT: Normocephalic and atraumatic. Conjunctivae are normal. No scleral icterus. Neck supple. No  thyromegaly Cardiovascular: Normal rate, regular rhythm.  Pulmonary/chest: Effort normal and breath sounds normal. No wheezing, rales or rhonchi. Abdominal: Soft, nondistended, nontender. Bowel sounds active throughout. There are no masses palpable. No hepatomegaly. Extremities: no edema Lymphadenopathy: No cervical adenopathy noted. Neurological: Alert and oriented to person place and time. Skin: Skin is warm and dry. No rashes noted. Psychiatric: Normal mood and affect. Behavior is normal.  ASSESSMENT AND PLAN: 54 year old female recently found to have heme-positive stools referred for evaluation. She has a history of duodenal ulcers due to H. pylori in the past and is having recurrent epigastric discomfort with early satiety. She will be scheduled for an EGD to evaluate for esophagitis, gastritis, ulcer etc.The risks, benefits, and alternatives to endoscopy with possible biopsy and possible dilation were discussed with the patient and they consent to proceed. She will also be scheduled for colonoscopy to evaluate for  recurrent polyps or neoplasia.The risks, benefits, and alternatives to colonoscopy with possible biopsy and possible polypectomy were discussed with the patient and they consent to proceed.  The procedures will be scheduled with Dr. Rhea Belton. Patient has signed a medical release to get records from [facility]. She will continue her pantoprazole 40 mg daily each morning. She will be given a trial of ranitidine 150 mg at at bedtime as well. A CBC, IBC panel, ferritin, and stool for H. pylori antigen will be obtained as well. Further recommendations will be made pending the findings of the above.   Hasini Peachey, Moise Boring 10/07/2014, 8:41 PM  CC: Sandford Craze, NP   Addendum: Reviewed and agree with initial management. Beverley Fiedler, MD

## 2014-10-09 ENCOUNTER — Other Ambulatory Visit: Payer: Self-pay | Admitting: *Deleted

## 2014-10-09 ENCOUNTER — Other Ambulatory Visit: Payer: Medicare Other

## 2014-10-09 DIAGNOSIS — R195 Other fecal abnormalities: Secondary | ICD-10-CM | POA: Diagnosis not present

## 2014-10-09 DIAGNOSIS — Z860100 Personal history of colon polyps, unspecified: Secondary | ICD-10-CM

## 2014-10-09 DIAGNOSIS — D509 Iron deficiency anemia, unspecified: Secondary | ICD-10-CM

## 2014-10-09 DIAGNOSIS — R1013 Epigastric pain: Secondary | ICD-10-CM | POA: Diagnosis not present

## 2014-10-10 ENCOUNTER — Telehealth: Payer: Self-pay | Admitting: *Deleted

## 2014-10-10 NOTE — Telephone Encounter (Signed)
I faxed a release of information to [facility] on 10-06-2014.  Received records back, including the colonoscopy dated 12 6 2012.  I gave them to [facility] PA on 10-10-2014.   Once reviewed they will be sent down to be scanned.

## 2014-10-12 LAB — H. PYLORI ANTIGEN, STOOL: H PYLORI AG STL: NEGATIVE

## 2014-10-31 ENCOUNTER — Ambulatory Visit (AMBULATORY_SURGERY_CENTER): Payer: Medicare Other | Admitting: Internal Medicine

## 2014-10-31 ENCOUNTER — Encounter: Payer: Self-pay | Admitting: Internal Medicine

## 2014-10-31 VITALS — BP 138/81 | HR 82 | Temp 99.3°F | Resp 20 | Ht 63.0 in | Wt 125.0 lb

## 2014-10-31 DIAGNOSIS — R6881 Early satiety: Secondary | ICD-10-CM

## 2014-10-31 DIAGNOSIS — I10 Essential (primary) hypertension: Secondary | ICD-10-CM | POA: Diagnosis not present

## 2014-10-31 DIAGNOSIS — R1013 Epigastric pain: Secondary | ICD-10-CM

## 2014-10-31 DIAGNOSIS — Z800 Family history of malignant neoplasm of digestive organs: Secondary | ICD-10-CM

## 2014-10-31 DIAGNOSIS — R195 Other fecal abnormalities: Secondary | ICD-10-CM

## 2014-10-31 DIAGNOSIS — Z8711 Personal history of peptic ulcer disease: Secondary | ICD-10-CM | POA: Diagnosis not present

## 2014-10-31 DIAGNOSIS — Z86010 Personal history of colonic polyps: Secondary | ICD-10-CM | POA: Diagnosis not present

## 2014-10-31 DIAGNOSIS — K2950 Unspecified chronic gastritis without bleeding: Secondary | ICD-10-CM | POA: Diagnosis not present

## 2014-10-31 DIAGNOSIS — Z8619 Personal history of other infectious and parasitic diseases: Secondary | ICD-10-CM | POA: Diagnosis not present

## 2014-10-31 MED ORDER — SODIUM CHLORIDE 0.9 % IV SOLN: 500.0000 mL | INTRAVENOUS | Status: DC

## 2014-10-31 NOTE — Op Note (Signed)
[facility] [address] [place], [id number]   ENDOSCOPY PROCEDURE REPORT  PATIENT: Molly May, [name]  MR#: [id number] BIRTHDATE: [birth year] , 53  yrs. old GENDER: female ENDOSCOPIST: Beverley FiedlerJay M Anslie Spadafora, MD REFERRED BY:  Alma DownsMelissa Lendell Caprice' Sullivan, N.P. PROCEDURE DATE:  10/31/2014 PROCEDURE:  EGD, diagnostic and EGD w/ biopsy for H.pylori ASA CLASS:     Class III INDICATIONS:  Heme positive stools, history of H.  pylori, history of ulcer disease, epigastric pain and early satiety.  iron deficiency MEDICATIONS: Monitored anesthesia care and Propofol 170 mg IV TOPICAL ANESTHETIC: none  DESCRIPTION OF PROCEDURE: After the risks benefits and alternatives of the procedure were thoroughly explained, informed consent was obtained.  The LB [id number] endoscope was introduced through the mouth and advanced to the second portion of the duodenum , Without limitations.  The instrument was slowly withdrawn as the mucosa was fully examined.    EXAM: The esophagus and gastroesophageal junction were completely normal in appearance.  The stomach was entered and closely examined.The antrum, angularis, and lesser curvature were well visualized, including a retroflexed view of the cardia and fundus. The stomach wall was normally distensible.  The scope passed easily through the pylorus into the duodenum.   Cold forcep biopsies were taken at the gastric body, antrum and angularis to evaluate for h. pylori.  Retroflexed views revealed no abnormalities.     The scope was then withdrawn from the patient and the procedure completed.  COMPLICATIONS: There were no immediate complications.  ENDOSCOPIC IMPRESSION: Normal upper endoscopy; gastric biopsies to exclude H. pylori  RECOMMENDATIONS: 1.  Await biopsy results 2.  Follow-up of helicobacter pylori status, treat if indicated 3.  Proceed with colonoscopy   eSigned:  Beverley FiedlerJay M Cassidee Deats, MD 10/31/2014 4:13 PM    CC:O'Sullivan,  Efraim KaufmannMelissa FNP and The Patient

## 2014-10-31 NOTE — Progress Notes (Signed)
To recovery, report to Myers, RN, VSS. 

## 2014-10-31 NOTE — Progress Notes (Signed)
Called to room to assist during endoscopic procedure.  Patient ID and intended procedure confirmed with present staff. Received instructions for my participation in the procedure from the performing physician.  

## 2014-10-31 NOTE — Op Note (Signed)
[facility] [address] [place], [id number]   COLONOSCOPY PROCEDURE REPORT  PATIENT: Molly May, Molly May  MR#: [id number] BIRTHDATE: 1961/01/15 , 53  yrs. old GENDER: female ENDOSCOPIST: Beverley FiedlerJay M Srijan Givan, MD REFERRED XB:[id number]:Melissa Val EagleLendell Caprice' Sullivan, N.P. PROCEDURE DATE:  10/31/2014 PROCEDURE:   Colonoscopy, surveillance First Screening Colonoscopy - Avg.  risk and is 50 yrs.  old or older - No.  Prior Negative Screening - Now for repeat screening. N/A  History of Adenoma - Now for follow-up colonoscopy & has been > or = to 3 yrs.  Yes hx of adenoma.  Has been 3 or more years since last colonoscopy.  Polyps removed today? No Recommend repeat exam, <10 yrs? Yes high risk ASA CLASS:   Class II INDICATIONS:history of colon polyps, family history of colon cancer in her father, heme-positive stool, iron deficiency. MEDICATIONS: Monitored anesthesia care, Propofol 130 mg IV, and Residual sedation present  DESCRIPTION OF PROCEDURE:   After the risks benefits and alternatives of the procedure were thoroughly explained, informed consent was obtained.  The digital rectal exam revealed no rectal mass.   The LB [id number]  endoscope was introduced through the anus and advanced to the cecum, which was identified by both the appendix and ileocecal valve. No adverse events experienced. The quality of the prep was good.  (Suprep was used)  The instrument was then slowly withdrawn as the colon was fully examined. Estimated blood loss is zero unless otherwise noted in this procedure report.      COLON FINDINGS: A normal appearing cecum, ileocecal valve, and appendiceal orifice were identified.  The ascending, transverse, descending, sigmoid colon, and rectum appeared unremarkable. Retroflexed views revealed internal hemorrhoids. The time to cecum = 6.2 Withdrawal time = 8.2   The scope was withdrawn and the procedure completed.  COMPLICATIONS: There were no immediate  complications.  ENDOSCOPIC IMPRESSION: Normal colonoscopy  RECOMMENDATIONS: 1.  Repeat Colonoscopy in 5 years. 2.  Continue iron supplementation 3.  Given iron deficiency anemia would recommend video capsule endoscopy.  Continue oral iron supplementation  eSigned:  Beverley FiedlerJay M Navarre Diana, MD 10/31/2014 4:17 PM   cc:  the patient, Sandford CrazeMelissa O'Sullivan

## 2014-10-31 NOTE — Patient Instructions (Signed)

## 2014-11-01 ENCOUNTER — Telehealth: Payer: Self-pay | Admitting: *Deleted

## 2014-11-01 NOTE — Telephone Encounter (Signed)
No answer. Spoke with interpreting service. Message left to call if questions or concerns.

## 2014-11-02 ENCOUNTER — Telehealth: Payer: Self-pay | Admitting: *Deleted

## 2014-11-02 NOTE — Telephone Encounter (Signed)
-----   Message from Daphine Deutscheregina N Lahari Suttles, RN sent at 10/09/2014 12:03 PM EDT ----- Call and remind patient [facility] CBC[facility]. Patient is deaf. Labs in.

## 2014-11-02 NOTE — Telephone Encounter (Signed)
Left a message for patient with request she come for labs for  Lori Hvozdovic, PA-C.

## 2014-11-03 ENCOUNTER — Other Ambulatory Visit (INDEPENDENT_AMBULATORY_CARE_PROVIDER_SITE_OTHER): Payer: Medicare Other

## 2014-11-03 DIAGNOSIS — D509 Iron deficiency anemia, unspecified: Secondary | ICD-10-CM | POA: Diagnosis not present

## 2014-11-03 LAB — CBC WITH DIFFERENTIAL/PLATELET
Basophils Absolute: 0 10*3/uL (ref 0.0–0.1)
Basophils Relative: 0.3 % (ref 0.0–3.0)
EOS PCT: 1.4 % (ref 0.0–5.0)
Eosinophils Absolute: 0.1 10*3/uL (ref 0.0–0.7)
HCT: 33.1 % — ABNORMAL LOW (ref 36.0–46.0)
Hemoglobin: 10.9 g/dL — ABNORMAL LOW (ref 12.0–15.0)
LYMPHS ABS: 2.3 10*3/uL (ref 0.7–4.0)
Lymphocytes Relative: 39.3 % (ref 12.0–46.0)
MCHC: 32.9 g/dL (ref 30.0–36.0)
MCV: 83.5 fl (ref 78.0–100.0)
MONO ABS: 0.4 10*3/uL (ref 0.1–1.0)
Monocytes Relative: 6.6 % (ref 3.0–12.0)
NEUTROS PCT: 52.4 % (ref 43.0–77.0)
Neutro Abs: 3.1 10*3/uL (ref 1.4–7.7)
PLATELETS: 219 10*3/uL (ref 150.0–400.0)
RBC: 3.96 Mil/uL (ref 3.87–5.11)
RDW: 14.2 % (ref 11.5–15.5)
WBC: 5.9 10*3/uL (ref 4.0–10.5)

## 2014-11-03 LAB — IBC PANEL
Iron: 35 ug/dL — ABNORMAL LOW (ref 42–145)
SATURATION RATIOS: 8.8 % — AB (ref 20.0–50.0)
TRANSFERRIN: 284 mg/dL (ref 212.0–360.0)

## 2014-11-07 ENCOUNTER — Encounter: Payer: Self-pay | Admitting: Internal Medicine

## 2014-12-06 ENCOUNTER — Ambulatory Visit (INDEPENDENT_AMBULATORY_CARE_PROVIDER_SITE_OTHER): Payer: Medicare Other | Admitting: Internal Medicine

## 2014-12-06 DIAGNOSIS — D509 Iron deficiency anemia, unspecified: Secondary | ICD-10-CM | POA: Diagnosis not present

## 2014-12-06 NOTE — Progress Notes (Signed)
Patient here for capsule endoscopy. Tolerated procedure. Verbalizes understanding of written and verbal instructions. Sign language interpreter With the patient. Capsule ID [id number] lot [id number] expiration 2017/09

## 2014-12-13 ENCOUNTER — Encounter: Payer: Self-pay | Admitting: Internal Medicine

## 2014-12-15 ENCOUNTER — Telehealth: Payer: Self-pay | Admitting: Internal Medicine

## 2014-12-15 DIAGNOSIS — D509 Iron deficiency anemia, unspecified: Secondary | ICD-10-CM

## 2014-12-15 NOTE — Telephone Encounter (Signed)
Left message for pt to call back  °

## 2014-12-15 NOTE — Telephone Encounter (Signed)
Spoke with pt and she is aware of results. Pt knows to return for labs in 3 months. Orders in epic.

## 2014-12-19 ENCOUNTER — Encounter: Payer: Self-pay | Admitting: Physician Assistant

## 2014-12-19 ENCOUNTER — Other Ambulatory Visit: Payer: Self-pay

## 2014-12-19 MED ORDER — PANTOPRAZOLE SODIUM 40 MG PO TBEC: 40.0000 mg | DELAYED_RELEASE_TABLET | Freq: Every day | ORAL | Status: DC

## 2014-12-20 ENCOUNTER — Ambulatory Visit: Payer: Medicare Other | Admitting: Family

## 2014-12-27 ENCOUNTER — Encounter: Payer: Self-pay | Admitting: Family

## 2014-12-27 ENCOUNTER — Ambulatory Visit (INDEPENDENT_AMBULATORY_CARE_PROVIDER_SITE_OTHER): Payer: Medicare Other | Admitting: Family

## 2014-12-27 VITALS — BP 110/70 | HR 75 | Temp 97.7°F | Resp 14 | Ht 63.0 in | Wt 122.4 lb

## 2014-12-27 DIAGNOSIS — Z0000 Encounter for general adult medical examination without abnormal findings: Secondary | ICD-10-CM

## 2014-12-27 DIAGNOSIS — E119 Type 2 diabetes mellitus without complications: Secondary | ICD-10-CM | POA: Diagnosis not present

## 2014-12-27 DIAGNOSIS — M19041 Primary osteoarthritis, right hand: Secondary | ICD-10-CM | POA: Insufficient documentation

## 2014-12-27 DIAGNOSIS — I10 Essential (primary) hypertension: Secondary | ICD-10-CM

## 2014-12-27 DIAGNOSIS — E785 Hyperlipidemia, unspecified: Secondary | ICD-10-CM | POA: Diagnosis not present

## 2014-12-27 DIAGNOSIS — M19042 Primary osteoarthritis, left hand: Secondary | ICD-10-CM

## 2014-12-27 DIAGNOSIS — K219 Gastro-esophageal reflux disease without esophagitis: Secondary | ICD-10-CM

## 2014-12-27 LAB — BASIC METABOLIC PANEL
BUN: 17 mg/dL (ref 6–23)
CO2: 29 meq/L (ref 19–32)
Calcium: 9.5 mg/dL (ref 8.4–10.5)
Chloride: 101 mEq/L (ref 96–112)
Creatinine, Ser: 0.73 mg/dL (ref 0.40–1.20)
GFR: 89.07 mL/min (ref >60.00)
GLUCOSE: 117 mg/dL — AB (ref 70–99)
POTASSIUM: 3.9 meq/L (ref 3.5–5.1)
SODIUM: 136 meq/L (ref 135–145)

## 2014-12-27 LAB — HEMOGLOBIN A1C: Hgb A1c MFr Bld: 7 % — ABNORMAL HIGH (ref 4.6–6.5)

## 2014-12-27 MED ORDER — ESTRADIOL 1 MG PO TABS: 1.0000 mg | ORAL_TABLET | Freq: Every day | ORAL | Status: DC

## 2014-12-27 NOTE — Progress Notes (Signed)
Subjective:    Molly May ID: Molly Molly May, female    DOB: 09-27-1960, 54 y.o.   MRN: [id number]  HPI  Ms. Molly Molly May is a 54 yr old female who presents today for follow up.  Molly May presents today for follow up of multiple medical problems.  Diabetes Type 2  Pt is currently maintained on the following medications for diabetes:actos, janumet xr  Lab Results  Component Value Date   HGBA1C 7.2* 08/02/2014   HGBA1C 6.7* 12/23/2013   HGBA1C 6.7* 09/02/2013    Lab Results  Component Value Date   MICROALBUR 0.1 08/02/2014   LDLCALC 51 08/02/2014   CREATININE 0.71 10/05/2014    Home glucose readings range 130 this AM, usually around 100 though.  Hyperlipidemia  Molly May is currently maintained on the following medication for hyperlipidemia: livalo Last lipid panel as follows:  Lab Results  Component Value Date   CHOL 151 08/02/2014   HDL 66.90 08/02/2014   LDLCALC 51 08/02/2014   TRIG 165.0* 08/02/2014   CHOLHDL 2 08/02/2014   Molly May denies myalgia. Molly May reports good compliance with low fat/low cholesterol diet.   Hypertension  Molly May is currently maintained on the following medications for blood pressure: lisinopril Molly May reports good compliance with blood pressure medications. Molly May denies chest pain, shortness of breath or swelling. Last 3 blood pressure readings in our office are as follows: BP Readings from Last 3 Encounters:  12/27/14 110/70  10/31/14 138/81  10/06/14 130/68   Joint pain- reports + pain/swelling, stiffness of fingers x 1 week.   gerd- on zantac and protonix.      Review of Systems See HPI  Past Medical History  Diagnosis Date  . Depression   . Diabetes mellitus   . GERD (gastroesophageal reflux disease)   . Hypertension   . Heart murmur   . Hyperlipidemia   . Stomach ulcer 26yrs of age  . Kidney stone   . Migraine   . 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 Anxiety disorder 02/19/2011  . Mitral valve prolapse 02/19/2011    . Sleep apnea 02/19/2011  . Allergy   . Anxiety     Social History   Social History  . Marital Status: Single    Spouse Name: N/A  . Number of Children: N/A  . Years of Education: N/A   Occupational History  . Not on file.   Social History Main Topics  . Smoking status: Passive Smoke Exposure - Never Smoker  . Smokeless tobacco: Never Used  . Alcohol Use: No  . Drug Use: No  . Sexual Activity: Not on file   Other Topics Concern  . Not on file   Social History Narrative   Regular exercise:  Yes. 3-5 x weekly   Caffeine Use:  No   Married- lives with husband, 2 children (age 49 and 70).   Disabled- due to deafness and overall work. Couldn't keep up.  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          Past Surgical History  Procedure Laterality Date  . Cholecystectomy  2011  . Breast surgery  2011    biposy--benign  . Appendectomy  1986  . Tonsillectomy and adenoidectomy  1973  . Abdominal hysterectomy  2000  . Ovarian cyst removal  1986, 1988  . Upper gastrointestinal endoscopy  2009    Gets them every 3 years  . Cataract extraction Right 10/13/11  .  Cataract extraction Left 03/2014    Family History  Problem Relation Age of Onset  . Kidney disease Mother   . Multiple sclerosis Mother   . Other Mother     polio  . Colon cancer Father   . Hyperlipidemia Father   . Heart disease Father   . Hypertension Father   . Diabetes Maternal Aunt   . Heart disease Maternal Grandmother   . Hypertension Maternal Grandmother   . Diabetes Maternal Grandmother   . Heart disease Maternal Grandfather   . Hypertension Maternal Grandfather     Allergies  Allergen Reactions  . Aspirin Other (See Comments)    Stomach pains  . Codeine Other (See Comments)    Nosebleed  . Eggs Or Egg-Derived Products     Pt reports allergy to RAW EGGS. ? Stomach upset, diarrhea  . Erythromycin Nausea And Vomiting   . Fluticasone Propionate     Stomach upset  . Penicillins Hives  . Prilosec [Omeprazole Magnesium]     migraine  . Vaseline [Petrolatum] Itching  . Vicodin [Hydrocodone-Acetaminophen]     twitching  . Zetia [Ezetimibe] Diarrhea    Current Outpatient Prescriptions on File Prior to Visit  Medication Sig Dispense Refill  . Calcium Citrate-Vitamin D (CITRACAL + D PO) Take 500 mg by mouth daily.    Marland Kitchen estradiol (ESTRACE) 1 MG tablet TAKE ONE TABLET BY MOUTH ONCE DAILY 30 tablet 3  . glucose blood (ONE TOUCH ULTRA TEST) test strip Use to check blood sugar every morning. 100 each 2  . lisinopril (PRINIVIL,ZESTRIL) 5 MG tablet TAKE ONE TABLET BY MOUTH ONCE DAILY FOR KIDNEY PROTECTION.  D/C PREVIOUS SCRIPTS FOR THIS MEDICATION 90 tablet 1  . LIVALO 4 MG TABS TAKE ONE TABLET BY MOUTH ONCE DAILY 90 tablet 1  . oxybutynin (DITROPAN XL) 5 MG 24 hr tablet Take 1 tablet (5 mg total) by mouth at bedtime. 90 tablet 1  . pantoprazole (PROTONIX) 40 MG tablet Take 40 mg by mouth daily.      . pantoprazole (PROTONIX) 40 MG tablet Take 1 tablet (40 mg total) by mouth daily. 90 tablet 3  . pioglitazone (ACTOS) 30 MG tablet Take 1 tablet (30 mg total) by mouth daily. 90 tablet 1  . ranitidine (ZANTAC) 150 MG tablet Take 1 tab at bedtime nightly. 30 tablet 6  . SitaGLIPtin-MetFORMIN HCl (JANUMET XR) 50-500 MG TB24 Take 2 tablets by mouth daily. 180 tablet 1   No current facility-administered medications on file prior to visit.    BP 110/70 mmHg  Pulse 75  Temp(Src) 97.7 F (36.5 C) (Oral)  Resp 14  Ht  (1.6 m)  Wt 122 lb 6.4 oz (55.52 kg)  BMI 21.69 kg/m2  SpO2 100%  LMP 04/28/1998       Objective:   Physical Exam  Constitutional: She is oriented to person, place, and time. She appears well-developed and well-nourished.  Cardiovascular: Normal rate, regular rhythm and normal heart sounds.   No murmur heard. Pulmonary/Chest: Effort normal and breath sounds normal. No respiratory distress.  She has no wheezes.  Musculoskeletal: She exhibits no edema.  Slight development of hebeden's nodes noted bilateral fingers.    Neurological: She is alert and oriented to person, place, and time.  Psychiatric: She has a normal mood and affect. Her behavior is normal. Judgment and thought content normal.          Assessment & Plan:

## 2014-12-27 NOTE — Progress Notes (Signed)
Pre visit review using our clinic review tool, if applicable. No additional management support is needed unless otherwise documented below in the visit note. 

## 2014-12-27 NOTE — Assessment & Plan Note (Signed)
Stable on current meds- she is being managed by Dr. Rhea Belton.

## 2014-12-27 NOTE — Assessment & Plan Note (Signed)
Advised pt most likely OA. Rec tylenol prn.

## 2014-12-27 NOTE — Assessment & Plan Note (Signed)
Stable, obtain a1c. Continue current meds.

## 2014-12-27 NOTE — Patient Instructions (Addendum)
Please complete lab work prior to leaving. Follow up in 3 months.  

## 2014-12-27 NOTE — Assessment & Plan Note (Signed)
Trigs mildly elevated.  LDL at goal, continue livalo.

## 2014-12-27 NOTE — Assessment & Plan Note (Signed)
BP stable. Continue current meds.   

## 2014-12-28 ENCOUNTER — Encounter: Payer: Self-pay | Admitting: Family

## 2015-02-05 ENCOUNTER — Encounter: Payer: Self-pay | Admitting: Family

## 2015-02-05 ENCOUNTER — Ambulatory Visit (HOSPITAL_BASED_OUTPATIENT_CLINIC_OR_DEPARTMENT_OTHER)
Admission: RE | Admit: 2015-02-05 | Discharge: 2015-02-05 | Disposition: A | Discharge status: Home / Self Care | Payer: Medicare Other | Source: Ambulatory Visit | Attending: Family | Admitting: Family

## 2015-02-05 DIAGNOSIS — Z0000 Encounter for general adult medical examination without abnormal findings: Secondary | ICD-10-CM

## 2015-02-05 DIAGNOSIS — Z1231 Encounter for screening mammogram for malignant neoplasm of breast: Secondary | ICD-10-CM | POA: Diagnosis not present

## 2015-02-14 ENCOUNTER — Other Ambulatory Visit: Payer: Self-pay | Admitting: Family

## 2015-02-14 ENCOUNTER — Ambulatory Visit (INDEPENDENT_AMBULATORY_CARE_PROVIDER_SITE_OTHER): Payer: Medicare Other

## 2015-02-14 DIAGNOSIS — Z23 Encounter for immunization: Secondary | ICD-10-CM

## 2015-02-27 ENCOUNTER — Other Ambulatory Visit: Payer: Self-pay | Admitting: Family

## 2015-03-16 ENCOUNTER — Telehealth: Payer: Self-pay

## 2015-03-16 NOTE — Telephone Encounter (Signed)
Letter mailed to pt regarding coming in for labs this month.

## 2015-03-16 NOTE — Telephone Encounter (Signed)
-----   Message from Chrystie NoseLinda R Hunt, RN sent at 12/15/2014  3:23 PM EDT ----- Regarding: Labs Pt needs labs in 3 mth, orders in epic.

## 2015-03-28 ENCOUNTER — Ambulatory Visit (HOSPITAL_BASED_OUTPATIENT_CLINIC_OR_DEPARTMENT_OTHER)
Admission: RE | Admit: 2015-03-28 | Discharge: 2015-03-28 | Disposition: A | Discharge status: Home / Self Care | Payer: Medicare Other | Source: Ambulatory Visit | Attending: Family | Admitting: Family

## 2015-03-28 ENCOUNTER — Ambulatory Visit (INDEPENDENT_AMBULATORY_CARE_PROVIDER_SITE_OTHER): Payer: Medicare Other | Admitting: Family

## 2015-03-28 ENCOUNTER — Telehealth: Payer: Self-pay | Admitting: Family

## 2015-03-28 ENCOUNTER — Encounter: Payer: Self-pay | Admitting: Family

## 2015-03-28 VITALS — BP 108/72 | HR 88 | Temp 97.8°F | Resp 14 | Ht 63.0 in | Wt 118.0 lb

## 2015-03-28 DIAGNOSIS — E1110 Type 2 diabetes mellitus with ketoacidosis without coma: Secondary | ICD-10-CM

## 2015-03-28 DIAGNOSIS — M25532 Pain in left wrist: Secondary | ICD-10-CM | POA: Diagnosis not present

## 2015-03-28 DIAGNOSIS — E119 Type 2 diabetes mellitus without complications: Secondary | ICD-10-CM

## 2015-03-28 DIAGNOSIS — R634 Abnormal weight loss: Secondary | ICD-10-CM | POA: Diagnosis not present

## 2015-03-28 DIAGNOSIS — E1310 Other specified diabetes mellitus with ketoacidosis without coma: Secondary | ICD-10-CM | POA: Diagnosis not present

## 2015-03-28 DIAGNOSIS — I10 Essential (primary) hypertension: Secondary | ICD-10-CM

## 2015-03-28 DIAGNOSIS — E785 Hyperlipidemia, unspecified: Secondary | ICD-10-CM

## 2015-03-28 LAB — BASIC METABOLIC PANEL
BUN: 19 mg/dL (ref 6–23)
CALCIUM: 10 mg/dL (ref 8.4–10.5)
CHLORIDE: 99 meq/L (ref 96–112)
CO2: 29 meq/L (ref 19–32)
Creatinine, Ser: 0.81 mg/dL (ref 0.40–1.20)
GFR: 78.92 mL/min (ref >60.00)
Glucose, Bld: 149 mg/dL — ABNORMAL HIGH (ref 70–99)
POTASSIUM: 3.9 meq/L (ref 3.5–5.1)
SODIUM: 137 meq/L (ref 135–145)

## 2015-03-28 LAB — HEMOGLOBIN A1C: Hgb A1c MFr Bld: 7.3 % — ABNORMAL HIGH (ref 4.6–6.5)

## 2015-03-28 LAB — TSH: TSH: 1.19 u[IU]/mL (ref 0.35–4.50)

## 2015-03-28 MED ORDER — OXYBUTYNIN CHLORIDE ER 5 MG PO TB24: 5.0000 mg | ORAL_TABLET | Freq: Every day | ORAL | Status: DC

## 2015-03-28 MED ORDER — PIOGLITAZONE HCL 30 MG PO TABS: 30.0000 mg | ORAL_TABLET | Freq: Every day | ORAL | Status: DC

## 2015-03-28 MED ORDER — LISINOPRIL 5 MG PO TABS: ORAL_TABLET | ORAL | Status: DC

## 2015-03-28 MED ORDER — SITAGLIP PHOS-METFORMIN HCL ER 50-500 MG PO TB24: 2.0000 | ORAL_TABLET | Freq: Every day | ORAL | Status: DC

## 2015-03-28 NOTE — Assessment & Plan Note (Signed)
At goal, continue livalo.  

## 2015-03-28 NOTE — Assessment & Plan Note (Signed)
BP stable, continue ACE.  

## 2015-03-28 NOTE — Progress Notes (Signed)
Subjective:    Patient ID: Molly May, female    DOB: 01/10/1961, 54 y.o.   MRN: [id number]  HPI  [name] is a 54 yr old female who presents today for follow up. A sign language interpretor is present for this exam.  1) DM2- currently maintained on actos and janumet. Reports that her sugars have been fluctuating up and down which she attributes to stress.   Lab Results  Component Value Date   HGBA1C 7.0* 12/27/2014   HGBA1C 7.2* 08/02/2014   HGBA1C 6.7* 12/23/2013   Lab Results  Component Value Date   MICROALBUR 0.1 08/02/2014   LDLCALC 51 08/02/2014   CREATININE 0.73 12/27/2014   2) Hyperlipidemia- She is maintained on Livalo. Lab Results  Component Value Date   LDLCALC 51 08/02/2014   3) HTN- she is maintained on low dose lisinopril for BP and renal protection. BP Readings from Last 3 Encounters:  03/28/15 108/72  12/27/14 110/70  10/31/14 138/81   4) L wrist pain- pt reports that it hurts to lift objects or open jars.  She reports that she injured her wrist when her large dog pushed her over after thanksgiving. Wrist began to bother her.    Review of Systems See has had some weight loss following diarrheal episode last week. Diarrhea has resolved. Reports + hot flashes.       Wt Readings from Last 3 Encounters:  03/28/15 118 lb (53.524 kg)  12/27/14 122 lb 6.4 oz (55.52 kg)  10/31/14 125 lb (56.7 kg)   Past Medical History  Diagnosis Date  . Depression   . Diabetes mellitus   . GERD (gastroesophageal reflux disease)   . Hypertension   . Heart murmur   . Hyperlipidemia   . Stomach ulcer [age]yrs of age  . Kidney stone   . Migraine   . 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 Anxiety disorder 02/19/2011  . Mitral valve prolapse 02/19/2011  . Sleep apnea 02/19/2011  . Allergy   . Anxiety     Social History   Social History  . Marital Status: Single    Spouse Name: N/A  . Number of Children: N/A  . Years of Education: N/A   Occupational History   . Not on file.   Social History Main Topics  . Smoking status: Passive Smoke Exposure - Never Smoker  . Smokeless tobacco: Never Used  . Alcohol Use: No  . Drug Use: No  . Sexual Activity: Not on file   Other Topics Concern  . Not on file   Social History Narrative   Regular exercise:  Yes. 3-5 x weekly   Caffeine Use:  No   Married- lives with husband, 2 children (age 937 and 798).   Disabled- due to deafness and overall work. Couldn't keep up.  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          Past Surgical History  Procedure Laterality Date  . Cholecystectomy  2011  . Breast surgery  2011    biposy--benign  . Appendectomy  1986  . Tonsillectomy and adenoidectomy  1973  . Abdominal hysterectomy  2000  . Ovarian cyst removal  1986, 1988  . Upper gastrointestinal endoscopy  2009    Gets them every 3 years  . Cataract extraction Right 10/13/11  . Cataract extraction Left 03/2014    Family History  Problem Relation Age of Onset  .  Kidney disease Mother   . Multiple sclerosis Mother   . Other Mother     polio  . Colon cancer Father   . Hyperlipidemia Father   . Heart disease Father   . Hypertension Father   . Diabetes Maternal Aunt   . Heart disease Maternal Grandmother   . Hypertension Maternal Grandmother   . Diabetes Maternal Grandmother   . Heart disease Maternal Grandfather   . Hypertension Maternal Grandfather     Allergies  Allergen Reactions  . Aspirin Other (See Comments)    Stomach pains  . Codeine Other (See Comments)    Nosebleed  . Eggs Or Egg-Derived Products     Pt reports allergy to RAW EGGS. ? Stomach upset, diarrhea  . Erythromycin Nausea And Vomiting  . Fluticasone Propionate     Stomach upset  . Penicillins Hives  . Prilosec [Omeprazole Magnesium]     migraine  . Vaseline [Petrolatum] Itching  . Vicodin [Hydrocodone-Acetaminophen]     twitching  . Zetia  [Ezetimibe] Diarrhea    Current Outpatient Prescriptions on File Prior to Visit  Medication Sig Dispense Refill  . Calcium Citrate-Vitamin D (CITRACAL + D PO) Take 500 mg by mouth daily.    Marland Kitchen estradiol (ESTRACE) 1 MG tablet Take 1 tablet (1 mg total) by mouth daily. 90 tablet 1  . glucose blood (ONE TOUCH ULTRA TEST) test strip Use to check blood sugar every morning. 100 each 2  . Iron Combinations (IRON COMPLEX PO) Take 75 mg by mouth daily.    Marland Kitchen lisinopril (PRINIVIL,ZESTRIL) 5 MG tablet TAKE ONE TABLET BY MOUTH ONCE DAILY FOR KIDNEY PROTECTION.  D/C PREVIOUS SCRIPTS FOR THIS MEDICATION 90 tablet 1  . LIVALO 4 MG TABS TAKE ONE TABLET BY MOUTH ONCE DAILY 90 tablet 1  . oxybutynin (DITROPAN XL) 5 MG 24 hr tablet Take 1 tablet (5 mg total) by mouth at bedtime. 90 tablet 1  . pantoprazole (PROTONIX) 40 MG tablet Take 1 tablet (40 mg total) by mouth daily. 90 tablet 3  . pioglitazone (ACTOS) 30 MG tablet Take 1 tablet (30 mg total) by mouth daily. 90 tablet 1  . ranitidine (ZANTAC) 150 MG tablet Take 1 tab at bedtime nightly. 30 tablet 6  . SitaGLIPtin-MetFORMIN HCl (JANUMET XR) 50-500 MG TB24 Take 2 tablets by mouth daily. 180 tablet 1   No current facility-administered medications on file prior to visit.    BP 108/72 mmHg  Pulse 88  Temp(Src) 97.8 F (36.6 C) (Oral)  Resp 14  Ht  (1.6 m)  Wt 118 lb (53.524 kg)  BMI 20.91 kg/m2  SpO2 99%  LMP 04/28/1998     Objective:   Physical Exam  Constitutional: She appears well-developed and well-nourished.  Cardiovascular: Normal rate, regular rhythm and normal heart sounds.   No murmur heard. Pulmonary/Chest: Effort normal and breath sounds normal. No respiratory distress. She has no wheezes.  Musculoskeletal:  Left wrist without swelling or tenderness.  + pain with ulnar deviation  Psychiatric: She has a normal mood and affect. Her behavior is normal. Judgment and thought content normal.          Assessment & Plan:  L  Wrist pain- obtain x ray to rule out fracture.  Weight loss- will obtain TSH. Advised pt to call if further weight loss or diarrhea.

## 2015-03-28 NOTE — Telephone Encounter (Signed)
Wrist x ray is negative for fracture.

## 2015-03-28 NOTE — Assessment & Plan Note (Signed)
Stable on current meds. Continue same, obtain A1C.  

## 2015-03-28 NOTE — Progress Notes (Signed)
Pre visit review using our clinic review tool, if applicable. No additional management support is needed unless otherwise documented below in the visit note. 

## 2015-03-28 NOTE — Patient Instructions (Signed)
Please complete lab work prior to leaving. Complete x-ray on the first floor.  

## 2015-03-29 ENCOUNTER — Telehealth: Payer: Self-pay | Admitting: Family

## 2015-03-29 MED ORDER — INSULIN PEN NEEDLE 32G X 4 MM MISC: Status: DC

## 2015-03-29 MED ORDER — INSULIN GLARGINE 100 UNIT/ML SOLOSTAR PEN: 5.0000 [IU] | PEN_INJECTOR | Freq: Every day | SUBCUTANEOUS | Status: DC

## 2015-03-29 NOTE — Addendum Note (Signed)
Addended by: Sandford Craze'SULLIVAN, Undra Harriman on: 03/29/2015 08:21 AM   Modules accepted: Orders

## 2015-03-29 NOTE — Telephone Encounter (Signed)
Yes, I saw that about invokana. We are going to do insulin instead.

## 2015-03-29 NOTE — Telephone Encounter (Signed)
Could you please book a nurse visit for the patient for insulin teaching?  Patient is hearing impaired and will need sign language interpretor as well.  She prefers to communicate via my chart.

## 2015-04-03 NOTE — Telephone Encounter (Signed)
Nurse visit scheduled for 04/05/15.

## 2015-04-05 ENCOUNTER — Ambulatory Visit (INDEPENDENT_AMBULATORY_CARE_PROVIDER_SITE_OTHER): Payer: Medicare Other | Admitting: Behavioral Health

## 2015-04-05 DIAGNOSIS — E1169 Type 2 diabetes mellitus with other specified complication: Secondary | ICD-10-CM

## 2015-04-05 DIAGNOSIS — Z794 Long term (current) use of insulin: Secondary | ICD-10-CM

## 2015-04-05 NOTE — Progress Notes (Signed)
Pre visit review using our clinic review tool, if applicable. No additional management support is needed unless otherwise documented below in the visit note.  Patient presented in office today for insulin teaching per telephone note on 03/28/15. Accompanied by a sign language interpreter due to patient being hearing impaired. Instructed the patient on how to prepare the FlexPen and apply the needle, prime or dial up the medication to the required dose and choosing an injection site to give the medication. Also, informed patient about cleaning and rotating the injection sites, as well as how to appropriately discard the needle. Teach back demonstration provided, utilizing an injection pad, Salin KwikPen and booklet (outlined where to inject the insulin). Patient verbalized understanding. Advised patient to call the office if there are any further questions or concerns. Total time spent in visit was 30 minutes.

## 2015-04-05 NOTE — Telephone Encounter (Signed)
error 

## 2015-04-09 DIAGNOSIS — H52203 Unspecified astigmatism, bilateral: Secondary | ICD-10-CM | POA: Diagnosis not present

## 2015-04-09 DIAGNOSIS — E119 Type 2 diabetes mellitus without complications: Secondary | ICD-10-CM | POA: Diagnosis not present

## 2015-04-09 DIAGNOSIS — Z961 Presence of intraocular lens: Secondary | ICD-10-CM | POA: Diagnosis not present

## 2015-04-12 NOTE — Progress Notes (Signed)
Reviewed

## 2015-04-14 NOTE — Progress Notes (Signed)
RN note reviewed. Agree with documention and plan. 

## 2015-06-14 ENCOUNTER — Telehealth: Payer: Self-pay | Admitting: Family

## 2015-06-26 ENCOUNTER — Ambulatory Visit: Payer: Medicare Other | Admitting: Family

## 2015-06-28 NOTE — Telephone Encounter (Signed)
Error

## 2015-07-09 ENCOUNTER — Ambulatory Visit (INDEPENDENT_AMBULATORY_CARE_PROVIDER_SITE_OTHER): Payer: Medicare Other | Admitting: Family

## 2015-07-09 ENCOUNTER — Encounter: Payer: Self-pay | Admitting: Family

## 2015-07-09 VITALS — BP 114/65 | HR 73 | Temp 97.8°F | Resp 16 | Ht 63.0 in | Wt 117.6 lb

## 2015-07-09 DIAGNOSIS — I10 Essential (primary) hypertension: Secondary | ICD-10-CM | POA: Diagnosis not present

## 2015-07-09 DIAGNOSIS — E785 Hyperlipidemia, unspecified: Secondary | ICD-10-CM

## 2015-07-09 DIAGNOSIS — E119 Type 2 diabetes mellitus without complications: Secondary | ICD-10-CM | POA: Diagnosis not present

## 2015-07-09 LAB — BASIC METABOLIC PANEL
BUN: 16 mg/dL (ref 6–23)
CO2: 28 mEq/L (ref 19–32)
CREATININE: 0.83 mg/dL (ref 0.40–1.20)
Calcium: 9.3 mg/dL (ref 8.4–10.5)
Chloride: 100 mEq/L (ref 96–112)
GFR: 76.64 mL/min (ref >60.00)
GLUCOSE: 110 mg/dL — AB (ref 70–99)
Potassium: 3.8 mEq/L (ref 3.5–5.1)
Sodium: 136 mEq/L (ref 135–145)

## 2015-07-09 LAB — LIPID PANEL
CHOLESTEROL: 141 mg/dL (ref 0–200)
HDL: 71.5 mg/dL (ref >39.00)
LDL CALC: 50 mg/dL (ref 0–99)
NONHDL: 69.14
Total CHOL/HDL Ratio: 2
Triglycerides: 96 mg/dL (ref 0.0–149.0)
VLDL: 19.2 mg/dL (ref 0.0–40.0)

## 2015-07-09 LAB — MICROALBUMIN / CREATININE URINE RATIO
CREATININE, U: 174.1 mg/dL
Microalb Creat Ratio: 0.4 mg/g (ref 0.0–30.0)

## 2015-07-09 NOTE — Patient Instructions (Signed)
Please complete lab work prior to leaving.   

## 2015-07-09 NOTE — Assessment & Plan Note (Signed)
Tolerating statin, obtain FLP.  

## 2015-07-09 NOTE — Assessment & Plan Note (Signed)
Clinically stable, continue current meds, obtain bmet, A1C and urine microalbumin.

## 2015-07-09 NOTE — Assessment & Plan Note (Signed)
BP stable on current meds.   

## 2015-07-09 NOTE — Progress Notes (Signed)
Pre visit review using our clinic review tool, if applicable. No additional management support is needed unless otherwise documented below in the visit note. 

## 2015-07-09 NOTE — Progress Notes (Signed)
Subjective:    Patient ID: Molly May, female    DOB: 03/05/1961, 55 y.o.   MRN: [id number]  HPI  Molly May is a 55 yr old female who presents today for follow up of multiple medical problems.  Patient presents today for follow up of multiple medical problems.  Diabetes Type 2  Pt is currently maintained on the following medications for diabetes:lantus, actos, janumet xr. Checking sugars infrequently. Reports high of 130, low 98  Lab Results  Component Value Date   HGBA1C 7.3* 03/28/2015   HGBA1C 7.0* 12/27/2014   HGBA1C 7.2* 08/02/2014    Lab Results  Component Value Date   MICROALBUR 0.1 08/02/2014   LDLCALC 51 08/02/2014   CREATININE 0.81 03/28/2015    Last diabetic eye exam was: 1/17  Hyperlipidemia  Patient is currently maintained on the following medication for hyperlipidemia: livalo Last lipid panel as follows:  Lab Results  Component Value Date   CHOL 151 08/02/2014   HDL 66.90 08/02/2014   LDLCALC 51 08/02/2014   TRIG 165.0* 08/02/2014   CHOLHDL 2 08/02/2014   Patient denies myalgia. Patient reports good compliance with low fat/low cholesterol diet.   Hypertension  Patient is currently maintained on the following medications for blood pressure: lisinopril Patient reports good compliance with blood pressure medications. Patient denies chest pain, shortness of breath or swelling. Last 3 blood pressure readings in our office are as follows: BP Readings from Last 3 Encounters:  07/09/15 114/65  03/28/15 108/72  12/27/14 110/70         Review of Systems    see HPI  Past Medical History  Diagnosis Date  . Depression   . Diabetes mellitus   . GERD (gastroesophageal reflux disease)   . Hypertension   . Heart murmur   . Hyperlipidemia   . Stomach ulcer 76yrs of age  . Kidney stone   . Migraine   . 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 Anxiety disorder 02/19/2011  . Mitral valve prolapse 02/19/2011  . Sleep apnea 02/19/2011  .  Allergy   . Anxiety     Social History   Social History  . Marital Status: Single    Spouse Name: N/A  . Number of Children: N/A  . Years of Education: N/A   Occupational History  . Not on file.   Social History Main Topics  . Smoking status: Passive Smoke Exposure - Never Smoker  . Smokeless tobacco: Never Used  . Alcohol Use: No  . Drug Use: No  . Sexual Activity: Not on file   Other Topics Concern  . Not on file   Social History Narrative   Regular exercise:  Yes. 3-5 x weekly   Caffeine Use:  No   Married- lives with husband, 2 children (age 12 and 5).   Disabled- due to deafness and overall work. Couldn't keep up.  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          Past Surgical History  Procedure Laterality Date  . Cholecystectomy  2011  . Breast surgery  2011    biposy--benign  . Appendectomy  1986  . Tonsillectomy and adenoidectomy  1973  . Abdominal hysterectomy  2000  . Ovarian cyst removal  1986, 1988  . Upper gastrointestinal endoscopy  2009    Gets them every 3 years  . Cataract extraction Right 10/13/11  . Cataract extraction Left 03/2014  Family History  Problem Relation Age of Onset  . Kidney disease Mother   . Multiple sclerosis Mother   . Other Mother     polio  . Colon cancer Father   . Hyperlipidemia Father   . Heart disease Father   . Hypertension Father   . Diabetes Maternal Aunt   . Heart disease Maternal Grandmother   . Hypertension Maternal Grandmother   . Diabetes Maternal Grandmother   . Heart disease Maternal Grandfather   . Hypertension Maternal Grandfather     Allergies  Allergen Reactions  . Aspirin Other (See Comments)    Stomach pains  . Codeine Other (See Comments)    Nosebleed  . Eggs Or Egg-Derived Products     Pt reports allergy to RAW EGGS. ? Stomach upset, diarrhea  . Erythromycin Nausea And Vomiting  . Fluticasone Propionate      Stomach upset  . Penicillins Hives  . Prilosec [Omeprazole Magnesium]     migraine  . Vaseline [Petrolatum] Itching  . Vicodin [Hydrocodone-Acetaminophen]     twitching  . Zetia [Ezetimibe] Diarrhea    Current Outpatient Prescriptions on File Prior to Visit  Medication Sig Dispense Refill  . Calcium Citrate-Vitamin D (CITRACAL + D PO) Take 500 mg by mouth daily.    Marland Kitchen. estradiol (ESTRACE) 1 MG tablet Take 1 tablet (1 mg total) by mouth daily. 90 tablet 1  . glucose blood (ONE TOUCH ULTRA TEST) test strip Use to check blood sugar every morning. 100 each 2  . Insulin Glargine (LANTUS SOLOSTAR) 100 UNIT/ML Solostar Pen Inject 5 Units into the skin daily at 10 pm. 1 pen 11  . Insulin Pen Needle (BD PEN NEEDLE NANO U/F) 32G X 4 MM MISC Use as directed 90 each 4  . Iron Combinations (IRON COMPLEX PO) Take 75 mg by mouth daily.    Marland Kitchen. lisinopril (PRINIVIL,ZESTRIL) 5 MG tablet TAKE ONE TABLET BY MOUTH ONCE DAILY FOR KIDNEY PROTECTION.  D/C PREVIOUS SCRIPTS FOR THIS MEDICATION 90 tablet 1  . LIVALO 4 MG TABS TAKE ONE TABLET BY MOUTH ONCE DAILY 90 tablet 1  . oxybutynin (DITROPAN XL) 5 MG 24 hr tablet Take 1 tablet (5 mg total) by mouth at bedtime. 90 tablet 1  . pantoprazole (PROTONIX) 40 MG tablet Take 1 tablet (40 mg total) by mouth daily. 90 tablet 3  . pioglitazone (ACTOS) 30 MG tablet Take 1 tablet (30 mg total) by mouth daily. 90 tablet 1  . ranitidine (ZANTAC) 150 MG tablet Take 1 tab at bedtime nightly. 30 tablet 6  . SitaGLIPtin-MetFORMIN HCl (JANUMET XR) 50-500 MG TB24 Take 2 tablets by mouth daily. 180 tablet 1   No current facility-administered medications on file prior to visit.    BP 114/65 mmHg  Pulse 73  Temp(Src) 97.8 F (36.6 C) (Oral)  Resp 16  Ht 5\' 3"  (1.6 m)  Wt 117 lb 9.6 oz (53.343 kg)  BMI 20.84 kg/m2  SpO2 100%  LMP 04/28/1998    Objective:   Physical Exam  Constitutional: She is oriented to person, place, and time. She appears well-developed and  well-nourished.  Cardiovascular: Normal rate, regular rhythm and normal heart sounds.   No murmur heard. Pulmonary/Chest: Effort normal and breath sounds normal. No respiratory distress. She has no wheezes.  Musculoskeletal: She exhibits no edema.  Neurological: She is alert and oriented to person, place, and time.  Skin: Skin is warm and dry.  Psychiatric: She has a normal mood and affect. Her behavior is  normal. Judgment and thought content normal.          Assessment & Plan:

## 2015-08-20 ENCOUNTER — Other Ambulatory Visit: Payer: Self-pay | Admitting: Family

## 2015-10-10 ENCOUNTER — Ambulatory Visit: Payer: Medicare Other | Admitting: Family

## 2015-10-17 ENCOUNTER — Ambulatory Visit (INDEPENDENT_AMBULATORY_CARE_PROVIDER_SITE_OTHER): Payer: Medicare Other | Admitting: Family

## 2015-10-17 ENCOUNTER — Telehealth: Payer: Self-pay | Admitting: Family

## 2015-10-17 ENCOUNTER — Encounter: Payer: Self-pay | Admitting: Family

## 2015-10-17 VITALS — BP 99/74 | HR 81 | Temp 98.1°F | Resp 16 | Ht 63.0 in | Wt 115.0 lb

## 2015-10-17 DIAGNOSIS — M79673 Pain in unspecified foot: Secondary | ICD-10-CM

## 2015-10-17 DIAGNOSIS — E119 Type 2 diabetes mellitus without complications: Secondary | ICD-10-CM

## 2015-10-17 DIAGNOSIS — I10 Essential (primary) hypertension: Secondary | ICD-10-CM

## 2015-10-17 DIAGNOSIS — N951 Menopausal and female climacteric states: Secondary | ICD-10-CM

## 2015-10-17 DIAGNOSIS — E739 Lactose intolerance, unspecified: Secondary | ICD-10-CM

## 2015-10-17 DIAGNOSIS — R232 Flushing: Secondary | ICD-10-CM

## 2015-10-17 LAB — BASIC METABOLIC PANEL
BUN: 14 mg/dL (ref 6–23)
CALCIUM: 9.8 mg/dL (ref 8.4–10.5)
CO2: 31 mEq/L (ref 19–32)
Chloride: 99 mEq/L (ref 96–112)
Creatinine, Ser: 0.75 mg/dL (ref 0.40–1.20)
GFR: 86.06 mL/min (ref >60.00)
Glucose, Bld: 137 mg/dL — ABNORMAL HIGH (ref 70–99)
Potassium: 3.9 mEq/L (ref 3.5–5.1)
SODIUM: 135 meq/L (ref 135–145)

## 2015-10-17 LAB — HEMOGLOBIN A1C: Hgb A1c MFr Bld: 6.8 % — ABNORMAL HIGH (ref 4.6–6.5)

## 2015-10-17 MED ORDER — PIOGLITAZONE HCL 30 MG PO TABS: 30.0000 mg | ORAL_TABLET | Freq: Every day | ORAL | Status: DC

## 2015-10-17 MED ORDER — GABAPENTIN 300 MG PO CAPS: 300.0000 mg | ORAL_CAPSULE | Freq: Every day | ORAL | Status: DC

## 2015-10-17 MED ORDER — OXYBUTYNIN CHLORIDE ER 5 MG PO TB24: 5.0000 mg | ORAL_TABLET | Freq: Every day | ORAL | Status: DC

## 2015-10-17 MED ORDER — LISINOPRIL 5 MG PO TABS: ORAL_TABLET | ORAL | Status: DC

## 2015-10-17 MED ORDER — SITAGLIP PHOS-METFORMIN HCL ER 50-500 MG PO TB24: 2.0000 | ORAL_TABLET | Freq: Every day | ORAL | Status: DC

## 2015-10-17 MED ORDER — LISINOPRIL 2.5 MG PO TABS: 2.5000 mg | ORAL_TABLET | Freq: Every day | ORAL | Status: DC

## 2015-10-17 NOTE — Assessment & Plan Note (Signed)
BP low. Decrease lisinopril 5 mg down to 2.5mg . (good for renal protection in setting of DM2)

## 2015-10-17 NOTE — Assessment & Plan Note (Signed)
Advised pt to avoid dairy products.

## 2015-10-17 NOTE — Progress Notes (Signed)
Pre visit review using our clinic review tool, if applicable. No additional management support is needed unless otherwise documented below in the visit note. 

## 2015-10-17 NOTE — Assessment & Plan Note (Signed)
Stable on current meds.  Obtain a1c.

## 2015-10-17 NOTE — Telephone Encounter (Signed)
Lisinopril dose cut in half today to (2.5mg  daily) due to low BP. [facility] at [facility] and verified that  5mg  Rx needs to be d/c'd and pt should be taking 2.5mg  once a day.

## 2015-10-17 NOTE — Patient Instructions (Addendum)
Please complete lab work prior to leaving. We are changing your lisinopril from 5mg  to 2.5mg  once daily. Avoid dairy to prevent diarrhea. For heel pain you may use tylenol as needed for heel pain. Ice feet twice daily.

## 2015-10-17 NOTE — Telephone Encounter (Signed)
[facility] - [place], [place] - [address] [phone] (Phone) [phone] (Fax)       Pharmacy in need of clarification regarding lisinopril (PRINIVIL,ZESTRIL) 2.5 MG tablet please advise. Please advise

## 2015-10-17 NOTE — Progress Notes (Signed)
Subjective:    Patient ID: Molly May, female    DOB: 01/21/1961, 55 y.o.   MRN: [id number]  HPI  [name] is a 55 yr old female who presents today for follow up. A sign language interpreter is present.   1) DM2- reports that she has been busy and not checking sugars often.  Last time she checked sugar was 110. Lowest was 88.   Lab Results  Component Value Date   HGBA1C 7.3* 03/28/2015   HGBA1C 7.0* 12/27/2014   HGBA1C 7.2* 08/02/2014   Lab Results  Component Value Date   MICROALBUR <0.7 07/09/2015   LDLCALC 50 07/09/2015   CREATININE 0.83 07/09/2015   2) HTN- maintained on lisinopril.   BP Readings from Last 3 Encounters:  10/17/15 99/74  07/09/15 114/65  03/28/15 108/72   3) Diarrhea-  Reports that she has diarrhea after dairy products.  If she avoids dairy she does not have diarrhea.  4) R heel pain- has been present x 1 month. Both heels are hurting.  Walking too much will make it worse. Elevating feet helps.  Has not tried any NSAIDS.   C/o hot flashes despite use of estradiol, happens at night, finds herself dripping in sweat. Review of Systems    see HPI  Past Medical History  Diagnosis Date  . Depression   . Diabetes mellitus   . GERD (gastroesophageal reflux disease)   . Hypertension   . Heart murmur   . Hyperlipidemia   . Stomach ulcer [age]yrs of age  . Kidney stone   . Migraine   . 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 Anxiety disorder 02/19/2011  . Mitral valve prolapse 02/19/2011  . Sleep apnea 02/19/2011  . Allergy   . Anxiety      Social History   Social History  . Marital Status: Single    Spouse Name: N/A  . Number of Children: N/A  . Years of Education: N/A   Occupational History  . Not on file.   Social History Main Topics  . Smoking status: Passive Smoke Exposure - Never Smoker  . Smokeless tobacco: Never Used  . Alcohol Use: No  . Drug Use: No  . Sexual Activity: Not on file   Other Topics Concern  . Not on file    Social History Narrative   Regular exercise:  Yes. 3-5 x weekly   Caffeine Use:  No   Married- lives with husband, 2 children (age 427 and 818).   Disabled- due to deafness and overall work. Couldn't keep up.  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          Past Surgical History  Procedure Laterality Date  . Cholecystectomy  2011  . Breast surgery  2011    biposy--benign  . Appendectomy  1986  . Tonsillectomy and adenoidectomy  1973  . Abdominal hysterectomy  2000  . Ovarian cyst removal  1986, 1988  . Upper gastrointestinal endoscopy  2009    Gets them every 3 years  . Cataract extraction Right 10/13/11  . Cataract extraction Left 03/2014    Family History  Problem Relation Age of Onset  . Kidney disease Mother   . Multiple sclerosis Mother   . Other Mother     polio  . Colon cancer Father   . Hyperlipidemia Father   . Heart disease Father   . Hypertension Father   .  Diabetes Maternal Aunt   . Heart disease Maternal Grandmother   . Hypertension Maternal Grandmother   . Diabetes Maternal Grandmother   . Heart disease Maternal Grandfather   . Hypertension Maternal Grandfather     Allergies  Allergen Reactions  . Aspirin Other (See Comments)    Stomach pains  . Codeine Other (See Comments)    Nosebleed  . Eggs Or Egg-Derived Products     Pt reports allergy to RAW EGGS. ? Stomach upset, diarrhea  . Erythromycin Nausea And Vomiting  . Fluticasone Propionate     Stomach upset  . Penicillins Hives  . Prilosec [Omeprazole Magnesium]     migraine  . Vaseline [Petrolatum] Itching  . Vicodin [Hydrocodone-Acetaminophen]     twitching  . Zetia [Ezetimibe] Diarrhea    Current Outpatient Prescriptions on File Prior to Visit  Medication Sig Dispense Refill  . Calcium Citrate-Vitamin D (CITRACAL + D PO) Take 500 mg by mouth daily.    Marland Kitchen estradiol (ESTRACE) 1 MG tablet TAKE ONE TABLET BY MOUTH  ONCE DAILY 90 tablet 1  . glucose blood (ONE TOUCH ULTRA TEST) test strip Use to check blood sugar every morning. 100 each 2  . Insulin Glargine (LANTUS SOLOSTAR) 100 UNIT/ML Solostar Pen Inject 5 Units into the skin daily at 10 pm. 1 pen 11  . Insulin Pen Needle (BD PEN NEEDLE NANO U/F) 32G X 4 MM MISC Use as directed 90 each 4  . Iron Combinations (IRON COMPLEX PO) Take 75 mg by mouth daily.    Marland Kitchen LIVALO 4 MG TABS TAKE ONE TABLET BY MOUTH ONCE DAILY 90 tablet 1  . pantoprazole (PROTONIX) 40 MG tablet Take 1 tablet (40 mg total) by mouth daily. 90 tablet 3  . ranitidine (ZANTAC) 150 MG tablet Take 1 tab at bedtime nightly. 30 tablet 6   No current facility-administered medications on file prior to visit.    BP 99/74 mmHg  Pulse 81  Temp(Src) 98.1 F (36.7 C) (Oral)  Resp 16  Ht  (1.6 m)  Wt 115 lb (52.164 kg)  BMI 20.38 kg/m2  SpO2 100%  LMP 04/28/1998    Objective:   Physical Exam  Constitutional: She is oriented to person, place, and time. She appears well-developed and well-nourished.  Cardiovascular: Normal rate, regular rhythm and normal heart sounds.   No murmur heard. Pulmonary/Chest: Effort normal and breath sounds normal. No respiratory distress. She has no wheezes.  Musculoskeletal: She exhibits no edema.  Neurological: She is alert and oriented to person, place, and time.  Psychiatric: She has a normal mood and affect. Her behavior is normal. Judgment and thought content normal.          Assessment & Plan:  Heel pain- advised pt to ice feet twice daily and use tylenol prn.   Hot flashes- trial of HS gabapentin

## 2015-10-18 ENCOUNTER — Encounter: Payer: Self-pay | Admitting: Family

## 2015-11-01 ENCOUNTER — Other Ambulatory Visit: Payer: Self-pay | Admitting: Family

## 2015-12-26 ENCOUNTER — Telehealth: Payer: Self-pay | Admitting: Family

## 2015-12-26 NOTE — Telephone Encounter (Signed)
_x_ LM thru sign language interpreting service to schedule AWV with RN _x_ Cell # not accepting calls, called to schedule AWV with RN

## 2016-01-18 ENCOUNTER — Ambulatory Visit (INDEPENDENT_AMBULATORY_CARE_PROVIDER_SITE_OTHER): Payer: Medicare Other | Admitting: Family

## 2016-01-18 ENCOUNTER — Encounter: Payer: Self-pay | Admitting: Family

## 2016-01-18 VITALS — BP 123/68 | HR 84 | Temp 97.8°F | Resp 14 | Ht 63.0 in | Wt 115.8 lb

## 2016-01-18 DIAGNOSIS — I10 Essential (primary) hypertension: Secondary | ICD-10-CM | POA: Diagnosis not present

## 2016-01-18 DIAGNOSIS — E785 Hyperlipidemia, unspecified: Secondary | ICD-10-CM | POA: Diagnosis not present

## 2016-01-18 DIAGNOSIS — M25532 Pain in left wrist: Secondary | ICD-10-CM | POA: Diagnosis not present

## 2016-01-18 DIAGNOSIS — Z23 Encounter for immunization: Secondary | ICD-10-CM | POA: Diagnosis not present

## 2016-01-18 DIAGNOSIS — Z1239 Encounter for other screening for malignant neoplasm of breast: Secondary | ICD-10-CM | POA: Diagnosis not present

## 2016-01-18 DIAGNOSIS — E119 Type 2 diabetes mellitus without complications: Secondary | ICD-10-CM

## 2016-01-18 LAB — BASIC METABOLIC PANEL
BUN: 21 mg/dL (ref 6–23)
CHLORIDE: 98 meq/L (ref 96–112)
CO2: 31 meq/L (ref 19–32)
CREATININE: 0.82 mg/dL (ref 0.40–1.20)
Calcium: 9.5 mg/dL (ref 8.4–10.5)
GFR: 77.57 mL/min (ref >60.00)
GLUCOSE: 121 mg/dL — AB (ref 70–99)
Potassium: 3.8 mEq/L (ref 3.5–5.1)
Sodium: 136 mEq/L (ref 135–145)

## 2016-01-18 LAB — HEMOGLOBIN A1C: HEMOGLOBIN A1C: 6.4 % (ref 4.6–6.5)

## 2016-01-18 NOTE — Patient Instructions (Addendum)
Please complete lab work prior to leaving. Wear the wrist breast as much as you are able during the day. You may continue tylenol as needed and ice your wrist 2 x daily as you are able. Let me know if your wrist pain worsens or if it is not improved in 2-3 weeks.

## 2016-01-18 NOTE — Assessment & Plan Note (Signed)
Sugars running a bit higher per report. Obtain follow up a1c, if above goal will plan to increase lantus dose.

## 2016-01-18 NOTE — Assessment & Plan Note (Signed)
LDL at goal, tolerating statin, continue same.  

## 2016-01-18 NOTE — Progress Notes (Signed)
Pre visit review using our clinic review tool, if applicable. No additional management support is needed unless otherwise documented below in the visit note. 

## 2016-01-18 NOTE — Assessment & Plan Note (Addendum)
BP stable, continue same.  

## 2016-01-18 NOTE — Progress Notes (Signed)
Subjective:    Patient ID: Molly May, female    DOB: [birth year], 55 y.o.   MRN: [id number]  HPI  Molly May is a 55 yr old female who presents today for follow up.  1) HTN- mainatined on lisinopril.   BP Readings from Last 3 Encounters:  01/18/16 123/68  10/17/15 99/74  07/09/15 114/65   2) DM2- She reports that her sugars have been "a little bit high" around 200 lately.  Notes that she has been very busy and husband just had heart surgery. (defibrillator).   She continues janumet and actos and lantus 5 units.  She has only had one sugar <80 since her last visit.     Lab Results  Component Value Date   HGBA1C 6.8 (H) 10/17/2015   HGBA1C 7.3 (H) 03/28/2015   HGBA1C 7.0 (H) 12/27/2014   Lab Results  Component Value Date   MICROALBUR <0.7 07/09/2015   LDLCALC 50 07/09/2015   CREATININE 0.75 10/17/2015   3) Left wrist pain x 10 days.  Reports difficulty picking up heavy items like gallon of milk or pot. Pain is located in the left wrist.  Denies known injury. She is right handed.  Has tried tylenol and ben gay without much improvement.   4) hyperlipidemia- continues livalo, no other myalgia.  Lab Results  Component Value Date   CHOL 141 07/09/2015   HDL 71.50 07/09/2015   LDLCALC 50 07/09/2015   TRIG 96.0 07/09/2015   CHOLHDL 2 07/09/2015   Sign language interpreter is present for today's visit.  Review of Systems See HPI  Past Medical History:  Diagnosis Date  . Allergy   . Anxiety   . Anxiety disorder 02/19/2011  . 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 Depression   . Diabetes mellitus   . GERD (gastroesophageal reflux disease)   . Heart murmur   . Hyperlipidemia   . Hypertension   . Kidney stone   . Migraine   . Mitral valve prolapse 02/19/2011  . Sleep apnea 02/19/2011  . Stomach ulcer 16yrs of age     Social History   Social History  . Marital status: Single    Spouse name: N/A  . Number of children: N/A  . Years of education: N/A    Occupational History  . Not on file.   Social History Main Topics  . Smoking status: Passive Smoke Exposure - Never Smoker  . Smokeless tobacco: Never Used  . Alcohol use No  . Drug use: No  . Sexual activity: Not on file   Other Topics Concern  . Not on file   Social History Narrative   Regular exercise:  Yes. 3-5 x weekly   Caffeine Use:  No   Married- lives with husband, 2 children (age 7 and 49).   Disabled- due to deafness and overall work. Couldn't keep up.  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          Past Surgical History:  Procedure Laterality Date  . ABDOMINAL HYSTERECTOMY  2000  . APPENDECTOMY  1986  . BREAST SURGERY  2011   biposy--benign  . CATARACT EXTRACTION Right 10/13/11  . CATARACT EXTRACTION Left 03/2014  . CHOLECYSTECTOMY  2011  . OVARIAN CYST REMOVAL  1986, 1988  . TONSILLECTOMY AND ADENOIDECTOMY  1973  . UPPER GASTROINTESTINAL ENDOSCOPY  2009   Gets them every 3 years    Family History  Problem Relation Age of Onset  . Kidney disease Mother   . Multiple sclerosis Mother   . Other Mother     polio  . Colon cancer Father   . Hyperlipidemia Father   . Heart disease Father   . Hypertension Father   . Diabetes Maternal Aunt   . Heart disease Maternal Grandmother   . Hypertension Maternal Grandmother   . Diabetes Maternal Grandmother   . Heart disease Maternal Grandfather   . Hypertension Maternal Grandfather     Allergies  Allergen Reactions  . Aspirin Other (See Comments)    Stomach pains  . Codeine Other (See Comments)    Nosebleed  . Eggs Or Egg-Derived Products     Pt reports allergy to RAW EGGS. ? Stomach upset, diarrhea  . Erythromycin Nausea And Vomiting  . Fluticasone Propionate     Stomach upset  . Penicillins Hives  . Prilosec [Omeprazole Magnesium]     migraine  . Vaseline [Petrolatum] Itching  . Vicodin [Hydrocodone-Acetaminophen]      twitching  . Zetia [Ezetimibe] Diarrhea    Current Outpatient Prescriptions on File Prior to Visit  Medication Sig Dispense Refill  . Calcium Citrate-Vitamin D (CITRACAL + D PO) Take 500 mg by mouth daily.    Marland Kitchen. estradiol (ESTRACE) 1 MG tablet TAKE ONE TABLET BY MOUTH ONCE DAILY 90 tablet 1  . gabapentin (NEURONTIN) 300 MG capsule Take 1 capsule (300 mg total) by mouth at bedtime. 90 capsule 1  . glucose blood (ONE TOUCH ULTRA TEST) test strip Use to check blood sugar every morning. 100 each 2  . Insulin Glargine (LANTUS SOLOSTAR) 100 UNIT/ML Solostar Pen Inject 5 Units into the skin daily at 10 pm. 1 pen 11  . Insulin Pen Needle (BD PEN NEEDLE NANO U/F) 32G X 4 MM MISC Use as directed 90 each 4  . Iron Combinations (IRON COMPLEX PO) Take 75 mg by mouth daily.    Marland Kitchen. lisinopril (PRINIVIL,ZESTRIL) 2.5 MG tablet Take 1 tablet (2.5 mg total) by mouth daily. 30 tablet 2  . oxybutynin (DITROPAN XL) 5 MG 24 hr tablet Take 1 tablet (5 mg total) by mouth at bedtime. 90 tablet 1  . pantoprazole (PROTONIX) 40 MG tablet Take 1 tablet (40 mg total) by mouth daily. 90 tablet 3  . pioglitazone (ACTOS) 30 MG tablet Take 1 tablet (30 mg total) by mouth daily. 90 tablet 1  . Pitavastatin Calcium (LIVALO) 4 MG TABS Take 1 tablet (4 mg total) by mouth daily. 90 tablet 1  . ranitidine (ZANTAC) 150 MG tablet Take 1 tab at bedtime nightly. 30 tablet 6  . SitaGLIPtin-MetFORMIN HCl (JANUMET XR) 50-500 MG TB24 Take 2 tablets by mouth daily. 180 tablet 1   No current facility-administered medications on file prior to visit.     BP 123/68   Pulse 84   Temp 97.8 F (36.6 C) (Oral)   Resp 14   Ht 5\' 3"  (1.6 m)   Wt 115 lb 12.8 oz (52.5 kg)   LMP 04/28/1998   SpO2 98% Comment: room air  BMI 20.51 kg/m       Objective:   Physical Exam  Constitutional: She is oriented to person, place, and time. She appears well-developed and well-nourished.  HENT:  Head: Normocephalic and atraumatic.  Cardiovascular: Normal  rate, regular rhythm and normal heart sounds.   No murmur heard. Pulmonary/Chest: Effort normal and breath sounds normal. No respiratory distress. She has no wheezes.  Musculoskeletal:  + pain with  flexion of left wrist, no swelling is noted  Neurological: She is alert and oriented to person, place, and time.  Skin: Skin is warm and dry.  Psychiatric: She has a normal mood and affect. Her behavior is normal. Judgment and thought content normal.          Assessment & Plan:  Flu shot today.  Left wrist pain- she is intolerant to NSAIDS- recommended wrist splint, tylenol, icing. Pt given wrist splint today.

## 2016-01-20 ENCOUNTER — Encounter: Payer: Self-pay | Admitting: Family

## 2016-01-31 ENCOUNTER — Telehealth: Payer: Self-pay | Admitting: Family

## 2016-01-31 DIAGNOSIS — M25532 Pain in left wrist: Secondary | ICD-10-CM

## 2016-01-31 NOTE — Telephone Encounter (Signed)
Caller name: Relationship to patient: Self Can be reached: [phone] (H) Pharmacy:  Reason for call: Patient called stating that she needs a referral to see someone about her wrist pain. States it is getting worse and she has already seen Sandford CrazeMelissa O'Sullivan about it.

## 2016-02-11 ENCOUNTER — Ambulatory Visit (HOSPITAL_BASED_OUTPATIENT_CLINIC_OR_DEPARTMENT_OTHER)
Admission: RE | Admit: 2016-02-11 | Discharge: 2016-02-11 | Disposition: A | Discharge status: Home / Self Care | Payer: Medicare Other | Source: Ambulatory Visit | Attending: Family | Admitting: Family

## 2016-02-11 ENCOUNTER — Other Ambulatory Visit: Payer: Self-pay | Admitting: Family

## 2016-02-11 DIAGNOSIS — Z1231 Encounter for screening mammogram for malignant neoplasm of breast: Secondary | ICD-10-CM | POA: Insufficient documentation

## 2016-02-11 DIAGNOSIS — Z1239 Encounter for other screening for malignant neoplasm of breast: Secondary | ICD-10-CM

## 2016-02-12 ENCOUNTER — Ambulatory Visit (INDEPENDENT_AMBULATORY_CARE_PROVIDER_SITE_OTHER): Payer: Medicare Other | Admitting: Family Medicine

## 2016-02-12 ENCOUNTER — Encounter: Payer: Self-pay | Admitting: Family Medicine

## 2016-02-12 VITALS — BP 135/82 | HR 85 | Ht 62.0 in | Wt 115.0 lb

## 2016-02-12 DIAGNOSIS — M79645 Pain in left finger(s): Secondary | ICD-10-CM | POA: Diagnosis not present

## 2016-02-12 DIAGNOSIS — M25532 Pain in left wrist: Secondary | ICD-10-CM | POA: Diagnosis not present

## 2016-02-12 MED ORDER — METHYLPREDNISOLONE ACETATE 40 MG/ML IJ SUSP
20.0000 mg | Freq: Once | INTRAMUSCULAR | Status: AC
  Administered 2016-02-12: 20 mg via INTRA_ARTICULAR

## 2016-02-12 NOTE — Patient Instructions (Addendum)
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 Wear the thumb spica brace as much as possible for the next week - can stop using once the shot has kicked in. You were given a cortisone injection today. Consider aleve 2 tabs twice a day with food OR ibuprofen 600mg  three times a day with food if you can take anti-inflammatories. Follow up with me in 4-6 weeks.

## 2016-02-12 NOTE — Progress Notes (Signed)
PCP and consultation requested by: Lemont Fillers., NP  Subjective:   HPI: Patient is a 55 y.o. female here for left wrist pain.  Sign language interpreter used for visit. Patient reports she's had about 1 month of worsening pain at base of left thumb/wrist. Has history of carpal tunnel in this wrist about 20 years ago but this feels different. Pain worse with picking up a pot, opening bottles. Taking tylenol and icing. Thumb goes numb occasionally. Is right handed. Pain 10/10 and sharp.  Past Medical History:  Diagnosis Date  . Allergy   . Anxiety   . Anxiety disorder 02/19/2011  . 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 Depression   . Diabetes mellitus   . GERD (gastroesophageal reflux disease)   . Heart murmur   . Hyperlipidemia   . Hypertension   . Kidney stone   . Migraine   . Mitral valve prolapse 02/19/2011  . Sleep apnea 02/19/2011  . Stomach ulcer 57yrs of age    Current Outpatient Prescriptions on File Prior to Visit  Medication Sig Dispense Refill  . Calcium Citrate-Vitamin D (CITRACAL + D PO) Take 500 mg by mouth daily.    Marland Kitchen estradiol (ESTRACE) 1 MG tablet TAKE ONE TABLET BY MOUTH ONCE DAILY 90 tablet 1  . gabapentin (NEURONTIN) 300 MG capsule Take 1 capsule (300 mg total) by mouth at bedtime. 90 capsule 1  . glucose blood (ONE TOUCH ULTRA TEST) test strip Use to check blood sugar every morning. 100 each 2  . Insulin Glargine (LANTUS SOLOSTAR) 100 UNIT/ML Solostar Pen Inject 5 Units into the skin daily at 10 pm. 1 pen 11  . Insulin Pen Needle (BD PEN NEEDLE NANO U/F) 32G X 4 MM MISC Use as directed 90 each 4  . Iron Combinations (IRON COMPLEX PO) Take 75 mg by mouth daily.    Marland Kitchen lisinopril (PRINIVIL,ZESTRIL) 2.5 MG tablet Take 1 tablet (2.5 mg total) by mouth daily. 30 tablet 2  . oxybutynin (DITROPAN XL) 5 MG 24 hr tablet Take 1 tablet (5 mg total) by mouth at bedtime. 90 tablet 1  . pantoprazole (PROTONIX) 40 MG tablet Take 1 tablet (40 mg total) by mouth  daily. 90 tablet 3  . pioglitazone (ACTOS) 30 MG tablet Take 1 tablet (30 mg total) by mouth daily. 90 tablet 1  . Pitavastatin Calcium (LIVALO) 4 MG TABS Take 1 tablet (4 mg total) by mouth daily. 90 tablet 1  . SitaGLIPtin-MetFORMIN HCl (JANUMET XR) 50-500 MG TB24 Take 2 tablets by mouth daily. 180 tablet 1   No current facility-administered medications on file prior to visit.     Past Surgical History:  Procedure Laterality Date  . ABDOMINAL HYSTERECTOMY  2000  . APPENDECTOMY  1986  . BREAST SURGERY  2011   biposy--benign  . CATARACT EXTRACTION Right 10/13/11  . CATARACT EXTRACTION Left 03/2014  . CHOLECYSTECTOMY  2011  . OVARIAN CYST REMOVAL  1986, 1988  . TONSILLECTOMY AND ADENOIDECTOMY  1973  . UPPER GASTROINTESTINAL ENDOSCOPY  2009   Gets them every 3 years    Allergies  Allergen Reactions  . Aspirin Other (See Comments)    Stomach pains  . Codeine Other (See Comments)    Nosebleed  . Eggs Or Egg-Derived Products     Pt reports allergy to RAW EGGS. ? Stomach upset, diarrhea  . Erythromycin Nausea And Vomiting  . Fluticasone Propionate     Stomach upset  . Penicillins Hives  . Prilosec [Omeprazole Magnesium]  migraine  . Vaseline [Petrolatum] Itching  . Vicodin [Hydrocodone-Acetaminophen]     twitching  . Zetia [Ezetimibe] Diarrhea    Social History   Social History  . Marital status: Single    Spouse name: N/A  . Number of children: N/A  . Years of education: N/A   Occupational History  . Not on file.   Social History Main Topics  . Smoking status: Passive Smoke Exposure - Never Smoker  . Smokeless tobacco: Never Used  . Alcohol use No  . Drug use: No  . Sexual activity: Not on file   Other Topics Concern  . Not on file   Social History Narrative   Regular exercise:  Yes. 3-5 x weekly   Caffeine Use:  No   Married- lives with husband, 2 children (age 677 and 568).   Disabled- due to deafness and overall work. Couldn't keep up.  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          Family History  Problem Relation Age of Onset  . Kidney disease Mother   . Multiple sclerosis Mother   . Other Mother     polio  . Colon cancer Father   . Hyperlipidemia Father   . Heart disease Father   . Hypertension Father   . Diabetes Maternal Aunt   . Heart disease Maternal Grandmother   . Hypertension Maternal Grandmother   . Diabetes Maternal Grandmother   . Heart disease Maternal Grandfather   . Hypertension Maternal Grandfather     BP 135/82   Pulse 85   Ht 5\' 2"  (1.575 m)   Wt 115 lb (52.2 kg)   LMP 04/28/1998   BMI 21.03 kg/m   Review of Systems: See HPI above.    Objective:  Physical Exam:  Gen: NAD, comfortable in exam room  Left hand/wrist: No gross deformity, swelling, bruising. TTP [facility] joint reproducing her pain.  No tenderness 1st dorsal compartment, carpal tunnel, elsewhere about hand or wrist. Mild limitation motion of 1st [facility] joint.  FROM IP, MCP joints, wrist. Negative tinels and finkelsteins. NVI distally.  Right hand: FROM digits, wrist without pain.    Assessment & Plan:  1. 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  We discussed options - she opted for injection today.  Thumb spica brace to help rest this with aleve or ibuprofen as needed.  F/u in 4-6 weeks.  After informed written consent patient was seated in chair in exam room.  Ultrasound used to identify left [facility] away from radial artery, prepped with alcohol swab then injected with 0.825ml:0.5mL marcaine: depomedrol.  Patient tolerated procedure well without immediate complications.

## 2016-02-14 DIAGNOSIS — M79645 Pain in left finger(s): Secondary | ICD-10-CM | POA: Insufficient documentation

## 2016-02-14 NOTE — Assessment & Plan Note (Signed)
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  We discussed options - she opted for injection today.  Thumb spica brace to help rest this with aleve or ibuprofen as needed.  F/u in 4-6 weeks.  After informed written consent patient was seated in chair in exam room.  Ultrasound used to identify left [facility] away from radial artery, prepped with alcohol swab then injected with 0.155ml:0.5mL marcaine: depomedrol.  Patient tolerated procedure well without immediate complications.

## 2016-02-15 ENCOUNTER — Other Ambulatory Visit: Payer: Self-pay | Admitting: Family

## 2016-03-18 ENCOUNTER — Ambulatory Visit (INDEPENDENT_AMBULATORY_CARE_PROVIDER_SITE_OTHER): Payer: Medicare Other | Admitting: Family Medicine

## 2016-03-18 ENCOUNTER — Encounter: Payer: Self-pay | Admitting: Family Medicine

## 2016-03-18 DIAGNOSIS — M79645 Pain in left finger(s): Secondary | ICD-10-CM

## 2016-03-18 NOTE — Patient Instructions (Signed)
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 Use the thumb spica brace only if needed. Follow up with me as needed.

## 2016-03-23 NOTE — Assessment & Plan Note (Signed)
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  Use brace only if needed at this point.  Aleve or ibuprofen as needed.  F/u prn.

## 2016-03-23 NOTE — Progress Notes (Signed)
PCP and consultation requested by: Lemont Fillers'SULLIVAN,MELISSA S., NP  Subjective:   HPI: Patient is a 55 y.o. female here for left wrist pain.  10/17: Sign language interpreter used for visit. Patient reports she's had about 1 month of worsening pain at base of left thumb/wrist. Has history of carpal tunnel in this wrist about 20 years ago but this feels different. Pain worse with picking up a pot, opening bottles. Taking tylenol and icing. Thumb goes numb occasionally. Is right handed. Pain 10/10 and sharp.  11/21: Sign language interpreter used for visit. Patient reports she is doing much better. Pain level 0/10. Much better after injection. Will wear brace if lifting something heavy. No skin changes, numbness.  Past Medical History:  Diagnosis Date  . Allergy   . Anxiety   . Anxiety disorder 02/19/2011  . 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 Depression   . Diabetes mellitus   . GERD (gastroesophageal reflux disease)   . Heart murmur   . Hyperlipidemia   . Hypertension   . Kidney stone   . Migraine   . Mitral valve prolapse 02/19/2011  . Sleep apnea 02/19/2011  . Stomach ulcer [age]yrs of age    Current Outpatient Prescriptions on File Prior to Visit  Medication Sig Dispense Refill  . Calcium Citrate-Vitamin D (CITRACAL + D PO) Take 500 mg by mouth daily.    Marland Kitchen. estradiol (ESTRACE) 1 MG tablet TAKE ONE TABLET BY MOUTH ONCE DAILY 90 tablet 1  . gabapentin (NEURONTIN) 300 MG capsule Take 1 capsule (300 mg total) by mouth at bedtime. 90 capsule 1  . glucose blood (ONE TOUCH ULTRA TEST) test strip Use to check blood sugar every morning. 100 each 2  . Insulin Glargine (LANTUS SOLOSTAR) 100 UNIT/ML Solostar Pen Inject 5 Units into the skin daily at 10 pm. 1 pen 11  . Insulin Pen Needle (BD PEN NEEDLE NANO U/F) 32G X 4 MM MISC Use as directed 90 each 4  . Iron Combinations (IRON COMPLEX PO) Take 75 mg by mouth daily.    Marland Kitchen. lisinopril (PRINIVIL,ZESTRIL) 2.5 MG tablet TAKE ONE TABLET BY  MOUTH ONCE DAILY 30 tablet 2  . oxybutynin (DITROPAN XL) 5 MG 24 hr tablet Take 1 tablet (5 mg total) by mouth at bedtime. 90 tablet 1  . pantoprazole (PROTONIX) 40 MG tablet Take 1 tablet (40 mg total) by mouth daily. 90 tablet 3  . pioglitazone (ACTOS) 30 MG tablet Take 1 tablet (30 mg total) by mouth daily. 90 tablet 1  . Pitavastatin Calcium (LIVALO) 4 MG TABS Take 1 tablet (4 mg total) by mouth daily. 90 tablet 1  . SitaGLIPtin-MetFORMIN HCl (JANUMET XR) 50-500 MG TB24 Take 2 tablets by mouth daily. 180 tablet 1   No current facility-administered medications on file prior to visit.     Past Surgical History:  Procedure Laterality Date  . ABDOMINAL HYSTERECTOMY  2000  . APPENDECTOMY  1986  . BREAST SURGERY  2011   biposy--benign  . CATARACT EXTRACTION Right 10/13/11  . CATARACT EXTRACTION Left 03/2014  . CHOLECYSTECTOMY  2011  . OVARIAN CYST REMOVAL  1986, 1988  . TONSILLECTOMY AND ADENOIDECTOMY  1973  . UPPER GASTROINTESTINAL ENDOSCOPY  2009   Gets them every 3 years    Allergies  Allergen Reactions  . Aspirin Other (See Comments)    Stomach pains  . Codeine Other (See Comments)    Nosebleed  . Eggs Or Egg-Derived Products     Pt reports allergy to RAW EGGS. ?  Stomach upset, diarrhea  . Erythromycin Nausea And Vomiting  . Fluticasone Propionate     Stomach upset  . Penicillins Hives  . Prilosec [Omeprazole Magnesium]     migraine  . Vaseline [Petrolatum] Itching  . Vicodin [Hydrocodone-Acetaminophen]     twitching  . Zetia [Ezetimibe] Diarrhea    Social History   Social History  . Marital status: Single    Spouse name: N/A  . Number of children: N/A  . Years of education: N/A   Occupational History  . Not on file.   Social History Main Topics  . Smoking status: Passive Smoke Exposure - Never Smoker  . Smokeless tobacco: Never Used  . Alcohol use No  . Drug use: No  . Sexual activity: Not on file   Other Topics Concern  . Not on file   Social  History Narrative   Regular exercise:  Yes. 3-5 x weekly   Caffeine Use:  No   Married- lives with husband, 2 children (age 427 and 588).   Disabled- due to deafness and overall work. Couldn't keep up.  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          Family History  Problem Relation Age of Onset  . Kidney disease Mother   . Multiple sclerosis Mother   . Other Mother     polio  . Colon cancer Father   . Hyperlipidemia Father   . Heart disease Father   . Hypertension Father   . Diabetes Maternal Aunt   . Heart disease Maternal Grandmother   . Hypertension Maternal Grandmother   . Diabetes Maternal Grandmother   . Heart disease Maternal Grandfather   . Hypertension Maternal Grandfather     BP 126/80   Pulse 91   Ht 5\' 2"  (1.575 m)   Wt 109 lb (49.4 kg)   LMP 04/28/1998   BMI 19.94 kg/m   Review of Systems: See HPI above.    Objective:  Physical Exam:  Gen: NAD, comfortable in exam room  Left hand/wrist: No gross deformity, swelling, bruising. No TTP 1st [facility] joint.  No tenderness 1st dorsal compartment, carpal tunnel, elsewhere about hand or wrist. Mild limitation motion of 1st CMC joint.  FROM IP, MCP joints, wrist. Negative tinels and finkelsteins. NVI distally.  Right hand: FROM digits, wrist without pain.    Assessment & Plan:  1. 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  Use brace only if needed at this point.  Aleve or ibuprofen as needed.  F/u prn.

## 2016-03-29 ENCOUNTER — Other Ambulatory Visit: Payer: Self-pay | Admitting: Family

## 2016-03-31 NOTE — Telephone Encounter (Signed)
Refill sent per [facility] refill protocol/SLS  

## 2016-04-11 ENCOUNTER — Encounter: Payer: Self-pay | Admitting: Family

## 2016-04-11 ENCOUNTER — Ambulatory Visit (INDEPENDENT_AMBULATORY_CARE_PROVIDER_SITE_OTHER): Payer: Medicare Other | Admitting: Family

## 2016-04-11 VITALS — BP 125/75 | HR 78 | Temp 97.5°F | Resp 16 | Ht 63.0 in | Wt 120.0 lb

## 2016-04-11 DIAGNOSIS — K219 Gastro-esophageal reflux disease without esophagitis: Secondary | ICD-10-CM | POA: Diagnosis not present

## 2016-04-11 DIAGNOSIS — E785 Hyperlipidemia, unspecified: Secondary | ICD-10-CM

## 2016-04-11 DIAGNOSIS — E119 Type 2 diabetes mellitus without complications: Secondary | ICD-10-CM | POA: Diagnosis not present

## 2016-04-11 DIAGNOSIS — D509 Iron deficiency anemia, unspecified: Secondary | ICD-10-CM | POA: Diagnosis not present

## 2016-04-11 LAB — CBC WITH DIFFERENTIAL/PLATELET
BASOS ABS: 0 10*3/uL (ref 0.0–0.1)
Basophils Relative: 0.6 % (ref 0.0–3.0)
EOS ABS: 0.1 10*3/uL (ref 0.0–0.7)
Eosinophils Relative: 2 % (ref 0.0–5.0)
HEMATOCRIT: 36.2 % (ref 36.0–46.0)
HEMOGLOBIN: 12 g/dL (ref 12.0–15.0)
LYMPHS PCT: 47 % — AB (ref 12.0–46.0)
Lymphs Abs: 2.9 10*3/uL (ref 0.7–4.0)
MCHC: 33.2 g/dL (ref 30.0–36.0)
MCV: 86.1 fl (ref 78.0–100.0)
MONO ABS: 0.3 10*3/uL (ref 0.1–1.0)
Monocytes Relative: 5.6 % (ref 3.0–12.0)
Neutro Abs: 2.8 10*3/uL (ref 1.4–7.7)
Neutrophils Relative %: 44.8 % (ref 43.0–77.0)
PLATELETS: 260 10*3/uL (ref 150.0–400.0)
RBC: 4.21 Mil/uL (ref 3.87–5.11)
RDW: 14.1 % (ref 11.5–15.5)
WBC: 6.2 10*3/uL (ref 4.0–10.5)

## 2016-04-11 LAB — BASIC METABOLIC PANEL
BUN: 18 mg/dL (ref 6–23)
CALCIUM: 9.4 mg/dL (ref 8.4–10.5)
CO2: 30 mEq/L (ref 19–32)
CREATININE: 0.72 mg/dL (ref 0.40–1.20)
Chloride: 99 mEq/L (ref 96–112)
GFR: 90.05 mL/min (ref >60.00)
Glucose, Bld: 117 mg/dL — ABNORMAL HIGH (ref 70–99)
Potassium: 4 mEq/L (ref 3.5–5.1)
Sodium: 136 mEq/L (ref 135–145)

## 2016-04-11 LAB — LIPID PANEL
CHOL/HDL RATIO: 2
Cholesterol: 172 mg/dL (ref 0–200)
HDL: 71.7 mg/dL (ref >39.00)
LDL CALC: 78 mg/dL (ref 0–99)
NONHDL: 100.39
Triglycerides: 114 mg/dL (ref 0.0–149.0)
VLDL: 22.8 mg/dL (ref 0.0–40.0)

## 2016-04-11 LAB — HEMOGLOBIN A1C: Hgb A1c MFr Bld: 6.7 % — ABNORMAL HIGH (ref 4.6–6.5)

## 2016-04-11 LAB — IRON: IRON: 79 ug/dL (ref 42–145)

## 2016-04-11 NOTE — Assessment & Plan Note (Signed)
Stable off of PPI.  Continue dietary modification

## 2016-04-11 NOTE — Assessment & Plan Note (Signed)
Tolerating statin, obtain lipid panel.  

## 2016-04-11 NOTE — Assessment & Plan Note (Signed)
Stable on current medications.  Obtain a1c

## 2016-04-11 NOTE — Progress Notes (Signed)
Subjective:    Patient ID: Molly May, female    DOB: 02/12/1961, 55 y.o.   MRN: [id number]  HPI  Molly May is a 55 yr old female who presents today for follow up of multiple medical problems. A sign language interpreter is present for today's exam.   DM2- she is maintained on lantus 5 units once daily as well as jaumet xr and actos. She is maintained on lisinopril for renal protection. Reports stable blood sugars.  She denies hypoglycemia.  Lab Results  Component Value Date   HGBA1C 6.4 01/18/2016   HGBA1C 6.8 (H) 10/17/2015   HGBA1C 7.3 (H) 03/28/2015   Lab Results  Component Value Date   MICROALBUR <0.7 07/09/2015   LDLCALC 50 07/09/2015   CREATININE 0.82 01/18/2016   2) Hyperlipidemia- maintained on livalo.  Denies myalgia.  Lab Results  Component Value Date   CHOL 141 07/09/2015   HDL 71.50 07/09/2015   LDLCALC 50 07/09/2015   TRIG 96.0 07/09/2015   CHOLHDL 2 07/09/2015   3) GERD-  She stopped PPI in September.  Reports that she is ok as long as she eliminates greasy foods.    4) Anemia- reports good compliance with iron, denies constipation.  Lab Results  Component Value Date   WBC 5.9 11/03/2014   HGB 10.9 (L) 11/03/2014   HCT 33.1 (L) 11/03/2014   MCV 83.5 11/03/2014   PLT 219.0 11/03/2014     Review of Systems See HPI  Past Medical History:  Diagnosis Date  . Allergy   . Anxiety   . Anxiety disorder 02/19/2011  . 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 Depression   . Diabetes mellitus   . GERD (gastroesophageal reflux disease)   . Heart murmur   . Hyperlipidemia   . Hypertension   . Kidney stone   . Migraine   . Mitral valve prolapse 02/19/2011  . Sleep apnea 02/19/2011  . Stomach ulcer [age]yrs of age     Social History   Social History  . Marital status: Single    Spouse name: N/A  . Number of children: N/A  . Years of education: N/A   Occupational History  . Not on file.   Social History Main Topics  . Smoking status: Passive  Smoke Exposure - Never Smoker  . Smokeless tobacco: Never Used  . Alcohol use No  . Drug use: No  . Sexual activity: Not on file   Other Topics Concern  . Not on file   Social History Narrative   Regular exercise:  Yes. 3-5 x weekly   Caffeine Use:  No   Married- lives with husband, 2 children (age 587 and 998).   Disabled- due to deafness and overall work. Couldn't keep up.  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          Past Surgical History:  Procedure Laterality Date  . ABDOMINAL HYSTERECTOMY  2000  . APPENDECTOMY  1986  . BREAST SURGERY  2011   biposy--benign  . CATARACT EXTRACTION Right 10/13/11  . CATARACT EXTRACTION Left 03/2014  . CHOLECYSTECTOMY  2011  . OVARIAN CYST REMOVAL  1986, 1988  . TONSILLECTOMY AND ADENOIDECTOMY  1973  . UPPER GASTROINTESTINAL ENDOSCOPY  2009   Gets them every 3 years    Family History  Problem Relation Age of Onset  . Kidney disease Mother   . Multiple sclerosis Mother   .  Other Mother     polio  . Colon cancer Father   . Hyperlipidemia Father   . Heart disease Father   . Hypertension Father   . Diabetes Maternal Aunt   . Heart disease Maternal Grandmother   . Hypertension Maternal Grandmother   . Diabetes Maternal Grandmother   . Heart disease Maternal Grandfather   . Hypertension Maternal Grandfather     Allergies  Allergen Reactions  . Aspirin Other (See Comments)    Stomach pains  . Codeine Other (See Comments)    Nosebleed  . Eggs Or Egg-Derived Products     Pt reports allergy to RAW EGGS. ? Stomach upset, diarrhea  . Erythromycin Nausea And Vomiting  . Fluticasone Propionate     Stomach upset  . Penicillins Hives  . Prilosec [Omeprazole Magnesium]     migraine  . Vaseline [Petrolatum] Itching  . Vicodin [Hydrocodone-Acetaminophen]     twitching  . Zetia [Ezetimibe] Diarrhea    Current Outpatient Prescriptions on File Prior to Visit    Medication Sig Dispense Refill  . Calcium Citrate-Vitamin D (CITRACAL + D PO) Take 500 mg by mouth daily.    Marland Kitchen. estradiol (ESTRACE) 1 MG tablet TAKE ONE TABLET BY MOUTH ONCE DAILY 90 tablet 1  . gabapentin (NEURONTIN) 300 MG capsule Take 1 capsule (300 mg total) by mouth at bedtime. 90 capsule 1  . glucose blood (ONE TOUCH ULTRA TEST) test strip Use to check blood sugar every morning. 100 each 2  . Insulin Glargine (LANTUS SOLOSTAR) 100 UNIT/ML Solostar Pen Inject 5 Units into the skin daily at 10 pm. 1 pen 11  . Insulin Pen Needle (BD PEN NEEDLE NANO U/F) 32G X 4 MM MISC Use as directed 90 each 4  . Iron Combinations (IRON COMPLEX PO) Take 75 mg by mouth daily.    Marland Kitchen. lisinopril (PRINIVIL,ZESTRIL) 2.5 MG tablet TAKE ONE TABLET BY MOUTH ONCE DAILY 30 tablet 2  . oxybutynin (DITROPAN XL) 5 MG 24 hr tablet Take 1 tablet (5 mg total) by mouth at bedtime. 90 tablet 1  . pioglitazone (ACTOS) 30 MG tablet Take 1 tablet (30 mg total) by mouth daily. 90 tablet 1  . Pitavastatin Calcium (LIVALO) 4 MG TABS Take 1 tablet (4 mg total) by mouth daily. 90 tablet 1  . SitaGLIPtin-MetFORMIN HCl (JANUMET XR) 50-500 MG TB24 Take 2 tablets by mouth daily. 180 tablet 1   No current facility-administered medications on file prior to visit.     BP 125/75 (BP Location: Right Arm, Cuff Size: Normal)   Pulse 78   Temp 97.5 F (36.4 C) (Oral)   Resp 16   Ht 5\' 3"  (1.6 m)   Wt 120 lb (54.4 kg)   LMP 04/28/1998   SpO2 100% Comment: room air  BMI 21.26 kg/m       Objective:   Physical Exam  Constitutional: She appears well-developed and well-nourished.  Cardiovascular: Normal rate, regular rhythm and normal heart sounds.   No murmur heard. Pulmonary/Chest: Effort normal and breath sounds normal. No respiratory distress. She has no wheezes.  Neurological: She is alert.  Skin: Skin is warm and dry.  Psychiatric: She has a normal mood and affect. Her behavior is normal. Judgment and thought content normal.           Assessment & Plan:

## 2016-04-11 NOTE — Patient Instructions (Addendum)
Please complete lab work prior to leaving. Merry Christmas!

## 2016-04-11 NOTE — Assessment & Plan Note (Signed)
Obtain iron and cbc.  Continue oral supplement.

## 2016-04-11 NOTE — Progress Notes (Signed)
Pre visit review using our clinic review tool, if applicable. No additional management support is needed unless otherwise documented below in the visit note. 

## 2016-04-22 ENCOUNTER — Other Ambulatory Visit: Payer: Self-pay | Admitting: Family

## 2016-05-09 ENCOUNTER — Telehealth: Payer: Self-pay | Admitting: *Deleted

## 2016-05-09 MED ORDER — LISINOPRIL 2.5 MG PO TABS: 2.5000 mg | ORAL_TABLET | Freq: Every day | ORAL | 1 refills | Status: DC

## 2016-05-09 NOTE — Telephone Encounter (Signed)
Received fax from [facility] requesting 90 day supply of lisinopril. Refill sent. 

## 2016-06-05 ENCOUNTER — Encounter: Payer: Self-pay | Admitting: Family

## 2016-06-13 ENCOUNTER — Other Ambulatory Visit: Payer: Self-pay | Admitting: Family

## 2016-07-08 ENCOUNTER — Other Ambulatory Visit: Payer: Self-pay | Admitting: Family

## 2016-07-11 ENCOUNTER — Telehealth: Payer: Self-pay | Admitting: Family

## 2016-07-11 ENCOUNTER — Ambulatory Visit (INDEPENDENT_AMBULATORY_CARE_PROVIDER_SITE_OTHER): Payer: Medicare Other | Admitting: Family

## 2016-07-11 ENCOUNTER — Encounter: Payer: Self-pay | Admitting: Family

## 2016-07-11 VITALS — BP 124/67 | HR 74 | Temp 98.0°F | Resp 16 | Ht 63.0 in | Wt 120.0 lb

## 2016-07-11 DIAGNOSIS — F439 Reaction to severe stress, unspecified: Secondary | ICD-10-CM | POA: Diagnosis not present

## 2016-07-11 DIAGNOSIS — Z794 Long term (current) use of insulin: Secondary | ICD-10-CM | POA: Diagnosis not present

## 2016-07-11 DIAGNOSIS — E119 Type 2 diabetes mellitus without complications: Secondary | ICD-10-CM | POA: Diagnosis not present

## 2016-07-11 DIAGNOSIS — R252 Cramp and spasm: Secondary | ICD-10-CM

## 2016-07-11 LAB — BASIC METABOLIC PANEL
BUN: 17 mg/dL (ref 6–23)
CHLORIDE: 98 meq/L (ref 96–112)
CO2: 30 mEq/L (ref 19–32)
CREATININE: 0.8 mg/dL (ref 0.40–1.20)
Calcium: 10.6 mg/dL — ABNORMAL HIGH (ref 8.4–10.5)
GFR: 79.66 mL/min (ref >60.00)
GLUCOSE: 90 mg/dL (ref 70–99)
Potassium: 3.7 mEq/L (ref 3.5–5.1)
Sodium: 135 mEq/L (ref 135–145)

## 2016-07-11 LAB — HEMOGLOBIN A1C: Hgb A1c MFr Bld: 6.5 % (ref 4.6–6.5)

## 2016-07-11 NOTE — Progress Notes (Signed)
   Subjective:    Patient ID: Molly May, female    DOB: 07/27/1960, 56 y.o.   MRN: [id number]  HPI  Molly May is a 56 yr old female who presents today for follow up.  She is accompanied by a sign language interpretor today.    DM2- reports that her sugar this AM was 200. She is waiting to get Actos.   Lab Results  Component Value Date   HGBA1C 6.7 (H) 04/11/2016   HGBA1C 6.4 01/18/2016   HGBA1C 6.8 (H) 10/17/2015   Lab Results  Component Value Date   MICROALBUR <0.7 07/09/2015   LDLCALC 78 04/11/2016   CREATININE 0.72 04/11/2016    She reports nightly leg cramps. Wakes her up at 5 PM. Taking gabapentin at bedtime.  Denies burning pain in legs.    HRT- feels well on estradiol. No issues.   Having a lot of stress with her family.  Her sone had suicide attempt.  Reports mild depression symptoms.    Review of Systems     Objective:   Physical Exam  Constitutional: She is oriented to person, place, and time. She appears well-developed and well-nourished.  Cardiovascular: Normal rate, regular rhythm and normal heart sounds.   No murmur heard. Pulmonary/Chest: Effort normal and breath sounds normal. No respiratory distress. She has no wheezes.  Musculoskeletal: She exhibits no edema.  Neurological: She is alert and oriented to person, place, and time.  Psychiatric: She has a normal mood and affect. Her behavior is normal. Judgment and thought content normal.          Assessment & Plan:  DM2- sugar up a touch since she ran out of actos, Restart actos, continue janumet, lantus.    Situational stress- would like referral to behavioral health for counseling. Gave patient information and contact info for [facility].   Leg cramps- discussed good hydration, stretching exercises before bed.  Check electrolytes.

## 2016-07-11 NOTE — Telephone Encounter (Signed)
Attempted to reach pt. No answer per relay interpreter so left message via interpreter to have pt return my call.

## 2016-07-11 NOTE — Patient Instructions (Addendum)
Please complete lab work prior to leaving. Please call [facility] and let then know that you are interested in scheduling an appointment with Blanchard Maneeri Bauert (therapist) at [facility].  [phone].

## 2016-07-11 NOTE — Progress Notes (Signed)
Pre visit review using our clinic review tool, if applicable. No additional management support is needed unless otherwise documented below in the visit note. 

## 2016-07-11 NOTE — Telephone Encounter (Signed)
Sugar control looks good.  Calcium is elevated. D/c calcium supplement.  Repeat bmet in 2 weeks, dx hypercalcemia.

## 2016-07-14 NOTE — Telephone Encounter (Signed)
Notified pt via relay interpreter and scheduled lab appt for 07/28/16 at 9:30am. Future order entered. Medlist updated.

## 2016-07-22 ENCOUNTER — Telehealth: Payer: Self-pay | Admitting: Family

## 2016-07-22 NOTE — Telephone Encounter (Signed)
Called patient to schedule awv. lvm for patient to call office to schedule appt.  °

## 2016-07-28 ENCOUNTER — Other Ambulatory Visit (INDEPENDENT_AMBULATORY_CARE_PROVIDER_SITE_OTHER): Payer: Medicare Other

## 2016-07-28 LAB — BASIC METABOLIC PANEL
BUN: 28 mg/dL — ABNORMAL HIGH (ref 6–23)
CALCIUM: 9.9 mg/dL (ref 8.4–10.5)
CHLORIDE: 100 meq/L (ref 96–112)
CO2: 27 mEq/L (ref 19–32)
CREATININE: 0.92 mg/dL (ref 0.40–1.20)
GFR: 67.78 mL/min (ref >60.00)
Glucose, Bld: 250 mg/dL — ABNORMAL HIGH (ref 70–99)
Potassium: 3.8 mEq/L (ref 3.5–5.1)
Sodium: 137 mEq/L (ref 135–145)

## 2016-08-11 ENCOUNTER — Ambulatory Visit: Payer: Self-pay | Admitting: Psychology

## 2016-08-18 ENCOUNTER — Ambulatory Visit (INDEPENDENT_AMBULATORY_CARE_PROVIDER_SITE_OTHER): Payer: 59 | Admitting: Psychology

## 2016-08-18 DIAGNOSIS — F4323 Adjustment disorder with mixed anxiety and depressed mood: Secondary | ICD-10-CM | POA: Diagnosis not present

## 2016-09-04 ENCOUNTER — Ambulatory Visit (INDEPENDENT_AMBULATORY_CARE_PROVIDER_SITE_OTHER): Payer: 59 | Admitting: Psychology

## 2016-09-04 DIAGNOSIS — F4323 Adjustment disorder with mixed anxiety and depressed mood: Secondary | ICD-10-CM | POA: Diagnosis not present

## 2016-09-30 ENCOUNTER — Ambulatory Visit (INDEPENDENT_AMBULATORY_CARE_PROVIDER_SITE_OTHER): Payer: 59 | Admitting: Psychology

## 2016-09-30 DIAGNOSIS — F4323 Adjustment disorder with mixed anxiety and depressed mood: Secondary | ICD-10-CM

## 2016-10-08 ENCOUNTER — Ambulatory Visit (INDEPENDENT_AMBULATORY_CARE_PROVIDER_SITE_OTHER): Payer: 59 | Admitting: Psychology

## 2016-10-08 DIAGNOSIS — F4323 Adjustment disorder with mixed anxiety and depressed mood: Secondary | ICD-10-CM | POA: Diagnosis not present

## 2016-10-13 ENCOUNTER — Encounter: Payer: Self-pay | Admitting: Family

## 2016-10-13 ENCOUNTER — Ambulatory Visit (INDEPENDENT_AMBULATORY_CARE_PROVIDER_SITE_OTHER): Payer: Medicare Other | Admitting: Family

## 2016-10-13 VITALS — BP 116/67 | HR 79 | Temp 97.9°F | Resp 16 | Ht 63.0 in | Wt 114.8 lb

## 2016-10-13 DIAGNOSIS — E785 Hyperlipidemia, unspecified: Secondary | ICD-10-CM

## 2016-10-13 DIAGNOSIS — N3281 Overactive bladder: Secondary | ICD-10-CM | POA: Diagnosis not present

## 2016-10-13 DIAGNOSIS — E119 Type 2 diabetes mellitus without complications: Secondary | ICD-10-CM

## 2016-10-13 DIAGNOSIS — Z794 Long term (current) use of insulin: Secondary | ICD-10-CM | POA: Diagnosis not present

## 2016-10-13 DIAGNOSIS — Z23 Encounter for immunization: Secondary | ICD-10-CM

## 2016-10-13 LAB — COMPREHENSIVE METABOLIC PANEL
ALBUMIN: 4.4 g/dL (ref 3.5–5.2)
ALK PHOS: 46 U/L (ref 39–117)
ALT: 8 U/L (ref 0–35)
AST: 13 U/L (ref 0–37)
BILIRUBIN TOTAL: 0.6 mg/dL (ref 0.2–1.2)
BUN: 20 mg/dL (ref 6–23)
CALCIUM: 9.9 mg/dL (ref 8.4–10.5)
CO2: 30 meq/L (ref 19–32)
Chloride: 100 mEq/L (ref 96–112)
Creatinine, Ser: 0.82 mg/dL (ref 0.40–1.20)
GFR: 77.35 mL/min (ref >60.00)
Glucose, Bld: 101 mg/dL — ABNORMAL HIGH (ref 70–99)
Potassium: 3.5 mEq/L (ref 3.5–5.1)
Sodium: 136 mEq/L (ref 135–145)
TOTAL PROTEIN: 7.4 g/dL (ref 6.0–8.3)

## 2016-10-13 LAB — HEMOGLOBIN A1C: HEMOGLOBIN A1C: 6.7 % — AB (ref 4.6–6.5)

## 2016-10-13 MED ORDER — ESTRADIOL 1 MG PO TABS: 1.0000 mg | ORAL_TABLET | Freq: Every day | ORAL | 1 refills | Status: DC

## 2016-10-13 MED ORDER — GABAPENTIN 300 MG PO CAPS: ORAL_CAPSULE | ORAL | 1 refills | Status: DC

## 2016-10-13 MED ORDER — PIOGLITAZONE HCL 30 MG PO TABS: 30.0000 mg | ORAL_TABLET | Freq: Every day | ORAL | 0 refills | Status: DC

## 2016-10-13 MED ORDER — LISINOPRIL 2.5 MG PO TABS: 2.5000 mg | ORAL_TABLET | Freq: Every day | ORAL | 0 refills | Status: DC

## 2016-10-13 MED ORDER — INSULIN GLARGINE 100 UNIT/ML SOLOSTAR PEN: 5.0000 [IU] | PEN_INJECTOR | Freq: Every day | SUBCUTANEOUS | 5 refills | Status: DC

## 2016-10-13 MED ORDER — PITAVASTATIN CALCIUM 4 MG PO TABS: 1.0000 | ORAL_TABLET | Freq: Every day | ORAL | 0 refills | Status: DC

## 2016-10-13 MED ORDER — OXYBUTYNIN CHLORIDE ER 5 MG PO TB24: 5.0000 mg | ORAL_TABLET | Freq: Every day | ORAL | 0 refills | Status: DC

## 2016-10-13 MED ORDER — SITAGLIP PHOS-METFORMIN HCL ER 50-500 MG PO TB24: 2.0000 | ORAL_TABLET | Freq: Every day | ORAL | 0 refills | Status: DC

## 2016-10-13 NOTE — Assessment & Plan Note (Signed)
Clinically stable.  Continue current meds, obtain follow up A1C.

## 2016-10-13 NOTE — Progress Notes (Signed)
Subjective:    Patient ID: Molly May, female    DOB: 10/28/1960, 56 y.o.   MRN: [id number]  HPI  [name] is a 56 yr old female who presents today for follow up. She will be moving to [place] in 2 weeks.   1) DM2- She reports that her sugars are high at times, lower at others. She has had a lot of stress with her son who had some seirus problems.  She is trying to keep her sugars steady. She reports one sugar was as high as 180 and the lowest she has seen is 85.    Lab Results  Component Value Date   HGBA1C 6.5 07/11/2016   HGBA1C 6.7 (H) 04/11/2016   HGBA1C 6.4 01/18/2016   Lab Results  Component Value Date   MICROALBUR <0.7 07/09/2015   LDLCALC 78 04/11/2016   CREATININE 0.92 07/28/2016   2) Hyperlipidemia- She continues livalo. Reports tolerating medication without difficulty.  Lab Results  Component Value Date   CHOL 172 04/11/2016   HDL 71.70 04/11/2016   LDLCALC 78 04/11/2016   TRIG 114.0 04/11/2016   CHOLHDL 2 04/11/2016   3) Overactive bladder- reports she still has some symptoms but the ditropan helps her.    Review of Systems See HPI  Past Medical History:  Diagnosis Date  . Allergy   . Anxiety   . Anxiety disorder 02/19/2011  . 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 Depression   . Diabetes mellitus   . GERD (gastroesophageal reflux disease)   . Heart murmur   . Hyperlipidemia   . Hypertension   . Kidney stone   . Migraine   . Mitral valve prolapse 02/19/2011  . Sleep apnea 02/19/2011  . Stomach ulcer [age]yrs of age     Social History   Social History  . Marital status: Single    Spouse name: N/A  . Number of children: N/A  . Years of education: N/A   Occupational History  . Not on file.   Social History Main Topics  . Smoking status: Passive Smoke Exposure - Never Smoker  . Smokeless tobacco: Never Used  . Alcohol use No  . Drug use: No  . Sexual activity: Not on file   Other Topics Concern  . Not on file   Social History  Narrative   Regular exercise:  Yes. 3-5 x weekly   Caffeine Use:  No   Married- lives with husband, 2 children (age 147 and 468).   Disabled- due to deafness and overall work. Couldn't keep up.  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          Past Surgical History:  Procedure Laterality Date  . ABDOMINAL HYSTERECTOMY  2000  . APPENDECTOMY  1986  . BREAST SURGERY  2011   biposy--benign  . CATARACT EXTRACTION Right 10/13/11  . CATARACT EXTRACTION Left 03/2014  . CHOLECYSTECTOMY  2011  . OVARIAN CYST REMOVAL  1986, 1988  . TONSILLECTOMY AND ADENOIDECTOMY  1973  . UPPER GASTROINTESTINAL ENDOSCOPY  2009   Gets them every 3 years    Family History  Problem Relation Age of Onset  . Kidney disease Mother   . Multiple sclerosis Mother   . Other Mother        polio  . Colon cancer Father   . Hyperlipidemia Father   . Heart disease Father   . Hypertension Father   .  Diabetes Maternal Aunt   . Heart disease Maternal Grandmother   . Hypertension Maternal Grandmother   . Diabetes Maternal Grandmother   . Heart disease Maternal Grandfather   . Hypertension Maternal Grandfather     Allergies  Allergen Reactions  . Aspirin Other (See Comments)    Stomach pains  . Codeine Other (See Comments)    Nosebleed  . Eggs Or Egg-Derived Products     Pt reports allergy to RAW EGGS. ? Stomach upset, diarrhea  . Erythromycin Nausea And Vomiting  . Fluticasone Propionate     Stomach upset  . Penicillins Hives  . Prilosec [Omeprazole Magnesium]     migraine  . Vaseline [Petrolatum] Itching  . Vicodin [Hydrocodone-Acetaminophen]     twitching  . Zetia [Ezetimibe] Diarrhea    Current Outpatient Prescriptions on File Prior to Visit  Medication Sig Dispense Refill  . BD PEN NEEDLE NANO U/F 32G X 4 MM MISC USE AS DIRECTED 100 each 4  . estradiol (ESTRACE) 1 MG tablet TAKE ONE TABLET BY MOUTH ONCE DAILY 90 tablet 1  .  gabapentin (NEURONTIN) 300 MG capsule TAKE ONE CAPSULE BY MOUTH ONCE DAILY AT BEDTIME 90 capsule 1  . glucose blood (ONE TOUCH ULTRA TEST) test strip Use to check blood sugar every morning. 100 each 2  . Insulin Glargine (LANTUS SOLOSTAR) 100 UNIT/ML Solostar Pen Inject 5 Units into the skin daily at 10 pm. 1 pen 11  . Iron Combinations (IRON COMPLEX PO) Take 75 mg by mouth daily.    Marland Kitchen JANUMET XR 50-500 MG TB24 TAKE TWO TABLETS BY MOUTH ONCE DAILY 180 tablet 1  . lisinopril (PRINIVIL,ZESTRIL) 2.5 MG tablet Take 1 tablet (2.5 mg total) by mouth daily. 90 tablet 1  . LIVALO 4 MG TABS TAKE ONE TABLET BY MOUTH ONCE DAILY 90 tablet 1  . oxybutynin (DITROPAN-XL) 5 MG 24 hr tablet TAKE ONE TABLET BY MOUTH ONCE DAILY AT BEDTIME 90 tablet 1  . pioglitazone (ACTOS) 30 MG tablet TAKE ONE TABLET BY MOUTH ONCE DAILY 90 tablet 1   No current facility-administered medications on file prior to visit.     BP 116/67 (BP Location: Left Arm, Cuff Size: Normal)   Pulse 79   Temp 97.9 F (36.6 C) (Oral)   Resp 16   Ht 5\' 3"  (1.6 m)   Wt 114 lb 12.8 oz (52.1 kg)   LMP 04/28/1998   SpO2 100%   BMI 20.34 kg/m       Objective:   Physical Exam  Constitutional: She is oriented to person, place, and time. She appears well-developed and well-nourished.  HENT:  Head: Normocephalic and atraumatic.  Cardiovascular: Normal rate, regular rhythm and normal heart sounds.   No murmur heard. Pulmonary/Chest: Effort normal and breath sounds normal. No respiratory distress. She has no wheezes.  Musculoskeletal: She exhibits no edema.  Neurological: She is alert and oriented to person, place, and time.  Psychiatric: She has a normal mood and affect. Her behavior is normal. Judgment and thought content normal.          Assessment & Plan:  Havrix #1 today.

## 2016-10-13 NOTE — Assessment & Plan Note (Signed)
Stable on detrol, continue same.  

## 2016-10-13 NOTE — Patient Instructions (Signed)
You will need a second dose of Havrix (hepatitis A vaccine) in 6 months with your new provider. Please complete lab work prior to leaving. Good luck with your move!

## 2016-10-13 NOTE — Addendum Note (Signed)
Addended by: Mervin KungFERGERSON, Brinsley Wence A on: 10/13/2016 09:33 AM   Modules accepted: Orders

## 2016-10-13 NOTE — Assessment & Plan Note (Signed)
Maintained on livalo, continue same. LDL at goal.

## 2016-10-21 ENCOUNTER — Ambulatory Visit (INDEPENDENT_AMBULATORY_CARE_PROVIDER_SITE_OTHER): Payer: 59 | Admitting: Psychology

## 2016-10-21 DIAGNOSIS — F4323 Adjustment disorder with mixed anxiety and depressed mood: Secondary | ICD-10-CM

## 2016-10-30 ENCOUNTER — Ambulatory Visit: Payer: 59 | Admitting: Psychology

## 2016-10-31 ENCOUNTER — Ambulatory Visit (INDEPENDENT_AMBULATORY_CARE_PROVIDER_SITE_OTHER): Payer: Medicare Other | Admitting: Psychology

## 2016-10-31 DIAGNOSIS — F4323 Adjustment disorder with mixed anxiety and depressed mood: Secondary | ICD-10-CM

## 2016-11-17 ENCOUNTER — Ambulatory Visit (INDEPENDENT_AMBULATORY_CARE_PROVIDER_SITE_OTHER): Payer: Medicaid Other | Admitting: Psychology

## 2016-11-17 DIAGNOSIS — F4323 Adjustment disorder with mixed anxiety and depressed mood: Secondary | ICD-10-CM

## 2016-12-04 ENCOUNTER — Ambulatory Visit (INDEPENDENT_AMBULATORY_CARE_PROVIDER_SITE_OTHER): Payer: Medicaid Other | Admitting: Psychology

## 2016-12-04 DIAGNOSIS — F4323 Adjustment disorder with mixed anxiety and depressed mood: Secondary | ICD-10-CM

## 2016-12-23 ENCOUNTER — Ambulatory Visit: Payer: Medicaid Other | Admitting: Psychology

## 2017-01-01 ENCOUNTER — Ambulatory Visit (INDEPENDENT_AMBULATORY_CARE_PROVIDER_SITE_OTHER): Payer: Medicare Other | Admitting: Psychology

## 2017-01-01 DIAGNOSIS — F4323 Adjustment disorder with mixed anxiety and depressed mood: Secondary | ICD-10-CM | POA: Diagnosis not present

## 2017-01-06 ENCOUNTER — Ambulatory Visit (INDEPENDENT_AMBULATORY_CARE_PROVIDER_SITE_OTHER): Payer: Medicare Other | Admitting: Family

## 2017-01-06 ENCOUNTER — Encounter: Payer: Self-pay | Admitting: Family

## 2017-01-06 VITALS — BP 122/84 | HR 73 | Temp 98.0°F | Resp 16 | Ht 63.0 in | Wt 122.4 lb

## 2017-01-06 DIAGNOSIS — K625 Hemorrhage of anus and rectum: Secondary | ICD-10-CM | POA: Diagnosis not present

## 2017-01-06 DIAGNOSIS — M79606 Pain in leg, unspecified: Secondary | ICD-10-CM

## 2017-01-06 DIAGNOSIS — Z23 Encounter for immunization: Secondary | ICD-10-CM

## 2017-01-06 DIAGNOSIS — E119 Type 2 diabetes mellitus without complications: Secondary | ICD-10-CM | POA: Diagnosis not present

## 2017-01-06 MED ORDER — GABAPENTIN 100 MG PO CAPS: 100.0000 mg | ORAL_CAPSULE | Freq: Three times a day (TID) | ORAL | 1 refills | Status: DC

## 2017-01-06 NOTE — Patient Instructions (Signed)
Decrease gabapentin to  at bedtime.

## 2017-01-06 NOTE — Progress Notes (Signed)
Subjective:    Patient ID: Molly May, female    DOB: 06-Nov-1960, 56 y.o.   MRN: [id number]  HPI   Ms. Hayashi is a female who presents today for follow-up. She is accompanied by a sign language interpreter.56 yr old   DM2- reports that she has not been checking her sugars lately.  Lowest sugar she has seen was 90, highest 130.   Lab Results  Component Value Date   HGBA1C 6.7 (H) 10/13/2016   HGBA1C 6.5 07/11/2016   HGBA1C 6.7 (H) 04/11/2016   Lab Results  Component Value Date   MICROALBUR <0.7 07/09/2015   LDLCALC 78 04/11/2016   CREATININE 0.82 10/13/2016   Reports one episode of BRBPR one month ago on the tissue only.  She does have hx of hemorrhoids. Colo 2016- normal.   she is currently maintained on 300 mg of gabapentin. Prior to starting this medication she was having leg pain at night. She reports that the medication helps her sleep better however she wakes up feeling groggy.    Review of Systems See HPI  Past Medical History:  Diagnosis Date  . Allergy   . Anxiety   . Anxiety disorder 02/19/2011  . 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 Depression   . Diabetes mellitus   . GERD (gastroesophageal reflux disease)   . Heart murmur   . Hyperlipidemia   . Hypertension   . Kidney stone   . Migraine   . Mitral valve prolapse 02/19/2011  . Sleep apnea 02/19/2011  . Stomach ulcer 45yrs of age     Social History   Social History  . Marital status: Single    Spouse name: N/A  . Number of children: N/A  . Years of education: N/A   Occupational History  . Not on file.   Social History Main Topics  . Smoking status: Passive Smoke Exposure - Never Smoker  . Smokeless tobacco: Never Used  . Alcohol use No  . Drug use: No  . Sexual activity: Not on file   Other Topics Concern  . Not on file   Social History Narrative   Regular exercise:  Yes. 3-5 x weekly   Caffeine Use:  No   Married- lives with husband, 2 children (age 93 and 52).   Disabled- due  to deafness and overall work. Couldn't keep up.  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          Past Surgical History:  Procedure Laterality Date  . ABDOMINAL HYSTERECTOMY  2000  . APPENDECTOMY  1986  . BREAST SURGERY  2011   biposy--benign  . CATARACT EXTRACTION Right 10/13/11  . CATARACT EXTRACTION Left 03/2014  . CHOLECYSTECTOMY  2011  . OVARIAN CYST REMOVAL  1986, 1988  . TONSILLECTOMY AND ADENOIDECTOMY  1973  . UPPER GASTROINTESTINAL ENDOSCOPY  2009   Gets them every 3 years    Family History  Problem Relation Age of Onset  . Kidney disease Mother   . Multiple sclerosis Mother   . Other Mother        polio  . Colon cancer Father   . Hyperlipidemia Father   . Heart disease Father   . Hypertension Father   . Diabetes Maternal Aunt   . Heart disease Maternal Grandmother   . Hypertension Maternal Grandmother   . Diabetes Maternal Grandmother   . Heart disease Maternal Grandfather   . Hypertension  Maternal Grandfather     Allergies  Allergen Reactions  . Aspirin Other (See Comments)    Stomach pains  . Codeine Other (See Comments)    Nosebleed  . Eggs Or Egg-Derived Products     Pt reports allergy to RAW EGGS. ? Stomach upset, diarrhea  . Erythromycin Nausea And Vomiting  . Fluticasone Propionate     Stomach upset  . Penicillins Hives  . Prilosec [Omeprazole Magnesium]     migraine  . Vaseline [Petrolatum] Itching  . Vicodin [Hydrocodone-Acetaminophen]     twitching  . Zetia [Ezetimibe] Diarrhea    Current Outpatient Prescriptions on File Prior to Visit  Medication Sig Dispense Refill  . BD PEN NEEDLE NANO U/F 32G X 4 MM MISC USE AS DIRECTED 100 each 4  . estradiol (ESTRACE) 1 MG tablet Take 1 tablet (1 mg total) by mouth daily. 90 tablet 1  . glucose blood (ONE TOUCH ULTRA TEST) test strip Use to check blood sugar every morning. 100 each 2  . Insulin Glargine (LANTUS SOLOSTAR)  100 UNIT/ML Solostar Pen Inject 5 Units into the skin daily at 10 pm. 1 pen 5  . Iron Combinations (IRON COMPLEX PO) Take 75 mg by mouth daily.    Marland Kitchen. lisinopril (PRINIVIL,ZESTRIL) 2.5 MG tablet Take 1 tablet (2.5 mg total) by mouth daily. 90 tablet 0  . oxybutynin (DITROPAN-XL) 5 MG 24 hr tablet Take 1 tablet (5 mg total) by mouth daily with breakfast. 90 tablet 0  . pioglitazone (ACTOS) 30 MG tablet Take 1 tablet (30 mg total) by mouth daily. 90 tablet 0  . Pitavastatin Calcium (LIVALO) 4 MG TABS Take 1 tablet (4 mg total) by mouth daily. 90 tablet 0  . SitaGLIPtin-MetFORMIN HCl (JANUMET XR) 50-500 MG TB24 Take 2 tablets by mouth daily. 180 tablet 0   No current facility-administered medications on file prior to visit.     Pulse 73   Temp 98 F (36.7 C) (Oral)   Resp 16   Ht 5\' 3"  (1.6 m)   Wt 122 lb 6.4 oz (55.5 kg)   LMP 04/28/1998   SpO2 100%   BMI 21.68 kg/m       Objective:   Physical Exam  Constitutional: She is oriented to person, place, and time. She appears well-developed and well-nourished.  Cardiovascular: Normal rate, regular rhythm and normal heart sounds.   No murmur heard. Pulmonary/Chest: Effort normal and breath sounds normal. No respiratory distress. She has no wheezes.  Neurological: She is alert and oriented to person, place, and time.  Psychiatric: She has a normal mood and affect. Her behavior is normal. Judgment and thought content normal.          Assessment & Plan:  Dm2- Stable, continue current meds.   Lab Results  Component Value Date   HGBA1C 6.7 (H) 10/13/2016   HGBA1C 6.5 07/11/2016   HGBA1C 6.7 (H) 04/11/2016   Lab Results  Component Value Date   MICROALBUR <0.7 07/09/2015   LDLCALC 78 04/11/2016   CREATININE 0.82 10/13/2016   Leg Pain- improved. Will decrease gabapentin from 300mg  once daily to 200mg  once daily to see if she can better tolerate this dose.   Rectal bleeding- had one episode one month ago- known hemorrhoids.  She is  up-to-date on her colonoscopy. I have advised her to let me know if she has recurrent episode. Monitor for now.

## 2017-01-07 ENCOUNTER — Ambulatory Visit (INDEPENDENT_AMBULATORY_CARE_PROVIDER_SITE_OTHER): Payer: Medicare Other | Admitting: Psychology

## 2017-01-07 DIAGNOSIS — F4323 Adjustment disorder with mixed anxiety and depressed mood: Secondary | ICD-10-CM | POA: Diagnosis not present

## 2017-01-12 ENCOUNTER — Telehealth: Payer: Self-pay | Admitting: *Deleted

## 2017-01-12 DIAGNOSIS — K625 Hemorrhage of anus and rectum: Secondary | ICD-10-CM

## 2017-01-12 NOTE — Telephone Encounter (Signed)
IFOB order entered. 

## 2017-01-12 NOTE — Telephone Encounter (Signed)
-----  Message from Debbrah Alar, NP sent at 01/06/2017 12:01 PM EDT ----- I gave her an IFOB kit dx rectal bleeding tks

## 2017-01-26 ENCOUNTER — Ambulatory Visit: Payer: Medicare Other | Admitting: Psychology

## 2017-03-09 ENCOUNTER — Ambulatory Visit (INDEPENDENT_AMBULATORY_CARE_PROVIDER_SITE_OTHER): Payer: Medicare Other | Admitting: Family

## 2017-03-09 ENCOUNTER — Encounter: Payer: Self-pay | Admitting: Family

## 2017-03-09 VITALS — BP 121/69 | HR 73 | Temp 98.1°F | Resp 16 | Ht 63.0 in | Wt 122.8 lb

## 2017-03-09 DIAGNOSIS — G6289 Other specified polyneuropathies: Secondary | ICD-10-CM

## 2017-03-09 DIAGNOSIS — Z0000 Encounter for general adult medical examination without abnormal findings: Secondary | ICD-10-CM

## 2017-03-09 NOTE — Progress Notes (Signed)
Subjective:    Patient ID: Molly May, female    DOB: 09/11/1960, 56 y.o.   MRN: [id number]  HPI  [name] is a 56 yr old female who presents today for follow up.  She is accompanied today by a sign language interpreter who assists with translation.  1) peripheral neuropathy- last visit we decrease gabapentin dose from 300mg  HS to 200mg  HS due to AM sleepiness on the 300mg  dose.  She reports feeling well on the 200 mg dose.  She denies any neuropathy pain.  She reports she is waking up feeling rested.   Review of Systems    see HPI  Past Medical History:  Diagnosis Date  . Allergy   . Anxiety   . Anxiety disorder 02/19/2011  . 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 Depression   . Diabetes mellitus   . GERD (gastroesophageal reflux disease)   . Heart murmur   . Hyperlipidemia   . Hypertension   . Kidney stone   . Migraine   . Mitral valve prolapse 02/19/2011  . Sleep apnea 02/19/2011  . Stomach ulcer [age]yrs of age     Social History   Socioeconomic History  . Marital status: Single    Spouse name: Not on file  . Number of children: Not on file  . Years of education: Not on file  . Highest education level: Not on file  Social Needs  . Financial resource strain: Not on file  . Food insecurity - worry: Not on file  . Food insecurity - inability: Not on file  . Transportation needs - medical: Not on file  . Transportation needs - non-medical: Not on file  Occupational History  . Not on file  Tobacco Use  . Smoking status: Passive Smoke Exposure - Never Smoker  . Smokeless tobacco: Never Used  Substance and Sexual Activity  . Alcohol use: No  . Drug use: No  . Sexual activity: Not on file  Other Topics Concern  . Not on file  Social History Narrative   Regular exercise:  Yes. 3-5 x weekly   Caffeine Use:  No   Married- lives with husband, 2 children (age 297 and 318).   Disabled- due to deafness and overall work. Couldn't keep up.  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          Past Surgical History:  Procedure Laterality Date  . ABDOMINAL HYSTERECTOMY  2000  . APPENDECTOMY  1986  . BREAST SURGERY  2011   biposy--benign  . CATARACT EXTRACTION Right 10/13/11  . CATARACT EXTRACTION Left 03/2014  . CHOLECYSTECTOMY  2011  . OVARIAN CYST REMOVAL  1986, 1988  . TONSILLECTOMY AND ADENOIDECTOMY  1973  . UPPER GASTROINTESTINAL ENDOSCOPY  2009   Gets them every 3 years    Family History  Problem Relation Age of Onset  . Kidney disease Mother   . Multiple sclerosis Mother   . Other Mother        polio  . Colon cancer Father   . Hyperlipidemia Father   . Heart disease Father   . Hypertension Father   . Diabetes Maternal Aunt   . Heart disease Maternal Grandmother   . Hypertension Maternal Grandmother   . Diabetes Maternal Grandmother   . Heart disease Maternal Grandfather   . Hypertension Maternal Grandfather     Allergies  Allergen Reactions  . Aspirin Other (See  Comments)    Stomach pains  . Codeine Other (See Comments)    Nosebleed  . Eggs Or Egg-Derived Products     Pt reports allergy to RAW EGGS. ? Stomach upset, diarrhea  . Erythromycin Nausea And Vomiting  . Fluticasone Propionate     Stomach upset  . Penicillins Hives  . Prilosec [Omeprazole Magnesium]     migraine  . Vaseline [Petrolatum] Itching  . Vicodin [Hydrocodone-Acetaminophen]     twitching  . Zetia [Ezetimibe] Diarrhea    Current Outpatient Medications on File Prior to Visit  Medication Sig Dispense Refill  . BD PEN NEEDLE NANO U/F 32G X 4 MM MISC USE AS DIRECTED 100 each 4  . estradiol (ESTRACE) 1 MG tablet Take 1 tablet (1 mg total) by mouth daily. 90 tablet 1  . gabapentin (NEURONTIN) 100 MG capsule Take 1 capsule (100 mg total) by mouth 3 (three) times daily. 90 capsule 1  . glucose blood (ONE TOUCH ULTRA TEST) test strip Use to check blood sugar every morning. 100 each  2  . Insulin Glargine (LANTUS SOLOSTAR) 100 UNIT/ML Solostar Pen Inject 5 Units into the skin daily at 10 pm. 1 pen 5  . Iron Combinations (IRON COMPLEX PO) Take 75 mg by mouth daily.    Marland Kitchen. lisinopril (PRINIVIL,ZESTRIL) 2.5 MG tablet Take 1 tablet (2.5 mg total) by mouth daily. 90 tablet 0  . oxybutynin (DITROPAN-XL) 5 MG 24 hr tablet Take 1 tablet (5 mg total) by mouth daily with breakfast. 90 tablet 0  . pioglitazone (ACTOS) 30 MG tablet Take 1 tablet (30 mg total) by mouth daily. 90 tablet 0  . Pitavastatin Calcium (LIVALO) 4 MG TABS Take 1 tablet (4 mg total) by mouth daily. 90 tablet 0  . SitaGLIPtin-MetFORMIN HCl (JANUMET XR) 50-500 MG TB24 Take 2 tablets by mouth daily. 180 tablet 0   No current facility-administered medications on file prior to visit.     BP 121/69 (BP Location: Left Arm, Cuff Size: Normal)   Pulse 73   Temp 98.1 F (36.7 C) (Oral)   Resp 16   Ht 5\' 3"  (1.6 m)   Wt 122 lb 12.8 oz (55.7 kg)   LMP 04/28/1998   SpO2 100%   BMI 21.75 kg/m    Objective:   Physical Exam  Constitutional: She is oriented to person, place, and time. She appears well-developed and well-nourished.  HENT:  Head: Normocephalic and atraumatic.  Cardiovascular: Normal rate, regular rhythm and normal heart sounds.  No murmur heard. Pulmonary/Chest: Effort normal and breath sounds normal. No respiratory distress. She has no wheezes.  Musculoskeletal: She exhibits no edema.  Neurological: She is alert and oriented to person, place, and time.  Psychiatric: She has a normal mood and affect. Her behavior is normal. Judgment and thought content normal.          Assessment & Plan:

## 2017-03-09 NOTE — Patient Instructions (Signed)
Continue current dose of gabapentin.

## 2017-03-10 ENCOUNTER — Other Ambulatory Visit: Payer: Self-pay | Admitting: Family

## 2017-03-10 ENCOUNTER — Ambulatory Visit (HOSPITAL_BASED_OUTPATIENT_CLINIC_OR_DEPARTMENT_OTHER)
Admission: RE | Admit: 2017-03-10 | Discharge: 2017-03-10 | Disposition: A | Discharge status: Home / Self Care | Payer: Medicare Other | Source: Ambulatory Visit | Attending: Family | Admitting: Family

## 2017-03-10 DIAGNOSIS — Z0000 Encounter for general adult medical examination without abnormal findings: Secondary | ICD-10-CM

## 2017-03-10 DIAGNOSIS — Z1231 Encounter for screening mammogram for malignant neoplasm of breast: Secondary | ICD-10-CM | POA: Diagnosis not present

## 2017-03-15 ENCOUNTER — Other Ambulatory Visit: Payer: Self-pay | Admitting: Family

## 2017-03-30 ENCOUNTER — Other Ambulatory Visit: Payer: Self-pay | Admitting: Family

## 2017-04-28 ENCOUNTER — Other Ambulatory Visit: Payer: Self-pay | Admitting: Family

## 2017-04-29 ENCOUNTER — Other Ambulatory Visit: Payer: Self-pay | Admitting: Family

## 2017-05-04 ENCOUNTER — Ambulatory Visit: Payer: Self-pay | Admitting: Family

## 2017-05-19 ENCOUNTER — Emergency Department (HOSPITAL_BASED_OUTPATIENT_CLINIC_OR_DEPARTMENT_OTHER): Payer: Medicare Other

## 2017-05-19 ENCOUNTER — Other Ambulatory Visit: Payer: Self-pay

## 2017-05-19 ENCOUNTER — Emergency Department (HOSPITAL_BASED_OUTPATIENT_CLINIC_OR_DEPARTMENT_OTHER)
Admission: EM | Admit: 2017-05-19 | Discharge: 2017-05-19 | Disposition: A | Discharge status: Home / Self Care | Payer: Medicare Other | Attending: Emergency Medicine | Admitting: Emergency Medicine

## 2017-05-19 ENCOUNTER — Encounter (HOSPITAL_BASED_OUTPATIENT_CLINIC_OR_DEPARTMENT_OTHER): Payer: Self-pay

## 2017-05-19 DIAGNOSIS — W0110XA Fall on same level from slipping, tripping and stumbling with subsequent striking against unspecified object, initial encounter: Secondary | ICD-10-CM | POA: Diagnosis not present

## 2017-05-19 DIAGNOSIS — M79645 Pain in left finger(s): Secondary | ICD-10-CM | POA: Diagnosis not present

## 2017-05-19 DIAGNOSIS — Y998 Other external cause status: Secondary | ICD-10-CM | POA: Diagnosis not present

## 2017-05-19 DIAGNOSIS — Y929 Unspecified place or not applicable: Secondary | ICD-10-CM | POA: Diagnosis not present

## 2017-05-19 DIAGNOSIS — I10 Essential (primary) hypertension: Secondary | ICD-10-CM | POA: Diagnosis not present

## 2017-05-19 DIAGNOSIS — F329 Major depressive disorder, single episode, unspecified: Secondary | ICD-10-CM | POA: Insufficient documentation

## 2017-05-19 DIAGNOSIS — Z79899 Other long term (current) drug therapy: Secondary | ICD-10-CM | POA: Diagnosis not present

## 2017-05-19 DIAGNOSIS — Y9389 Activity, other specified: Secondary | ICD-10-CM | POA: Insufficient documentation

## 2017-05-19 DIAGNOSIS — Z9049 Acquired absence of other specified parts of digestive tract: Secondary | ICD-10-CM | POA: Insufficient documentation

## 2017-05-19 DIAGNOSIS — W19XXXA Unspecified fall, initial encounter: Secondary | ICD-10-CM

## 2017-05-19 DIAGNOSIS — Z794 Long term (current) use of insulin: Secondary | ICD-10-CM | POA: Insufficient documentation

## 2017-05-19 DIAGNOSIS — S01112A Laceration without foreign body of left eyelid and periocular area, initial encounter: Secondary | ICD-10-CM | POA: Diagnosis not present

## 2017-05-19 DIAGNOSIS — H905 Unspecified sensorineural hearing loss: Secondary | ICD-10-CM | POA: Diagnosis not present

## 2017-05-19 DIAGNOSIS — F419 Anxiety disorder, unspecified: Secondary | ICD-10-CM | POA: Insufficient documentation

## 2017-05-19 DIAGNOSIS — S0993XA Unspecified injury of face, initial encounter: Secondary | ICD-10-CM | POA: Diagnosis present

## 2017-05-19 DIAGNOSIS — S6992XA Unspecified injury of left wrist, hand and finger(s), initial encounter: Secondary | ICD-10-CM | POA: Diagnosis not present

## 2017-05-19 DIAGNOSIS — S0181XA Laceration without foreign body of other part of head, initial encounter: Secondary | ICD-10-CM

## 2017-05-19 DIAGNOSIS — E119 Type 2 diabetes mellitus without complications: Secondary | ICD-10-CM | POA: Diagnosis not present

## 2017-05-19 NOTE — ED Triage Notes (Signed)
Pt tripped/fell approx 12pm-pain to left thumb, slight lac to left lateral eye area-no LOC-NAD-steady gait-pt is hearing impaired-husband sign language interpreter

## 2017-05-19 NOTE — ED Notes (Signed)
Pt and husband verbalize understanding of dc instructions and deny any further needs at this time

## 2017-05-19 NOTE — ED Notes (Signed)
ED Provider at bedside. 

## 2017-05-19 NOTE — ED Provider Notes (Signed)
[facility] EMERGENCY DEPARTMENT Provider Note   CSN: [id number] Arrival date & time: 05/19/17  1302     History   Chief Complaint Chief Complaint  Patient presents with  . Fall    HPI Molly May is a 57 y.o. female.  Patient is hearing impaired. Information received from patient and husband. Patient with a reported mechanical fall earlier today. She is complaining pain to left thumb and a superficial laceration to the outer aspect of her left orbit. No loss of consciousness. Tetanus up to date.   The history is provided by the patient and a relative.  Fall  This is a new problem. The current episode started 3 to 5 hours ago. The problem has not changed since onset.   Past Medical History:  Diagnosis Date  . Allergy   . Anxiety   . Anxiety disorder 02/19/2011  . 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 Depression   . Diabetes mellitus   . GERD (gastroesophageal reflux disease)   . Heart murmur   . Hyperlipidemia   . Hypertension   . Kidney stone   . Migraine   . Mitral valve prolapse 02/19/2011  . Sleep apnea 02/19/2011  . Stomach ulcer 14yrs of age    Patient Active Problem List   Diagnosis Date Noted  . Anemia, iron deficiency 04/11/2016  . Lactose intolerance 10/17/2015  . Osteoarthritis of both hands 12/27/2014  . Overactive bladder 06/19/2014  . Low back pain 05/10/2014  . Hematuria 12/23/2013  . Insomnia 05/31/2013  . Sciatica 01/28/2013  . Nocturnal leg cramps 11/03/2011  . Hyperlipidemia 05/02/2011  . 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 Anxiety disorder 02/19/2011  . Irritable bowel syndrome 02/19/2011  . Mitral valve prolapse 02/19/2011  . Sleep apnea 02/19/2011  . Migraine 02/19/2011  . General medical examination 01/17/2011  . Diabetic peripheral neuropathy (HCC) 01/17/2011  . GERD (gastroesophageal reflux disease) 12/18/2010  . Diabetes type 2, controlled (HCC) 12/18/2010    Past Surgical History:  Procedure Laterality Date    . ABDOMINAL HYSTERECTOMY  2000  . APPENDECTOMY  1986  . BREAST SURGERY  2011   biposy--benign  . CATARACT EXTRACTION Right 10/13/11  . CATARACT EXTRACTION Left 03/2014  . CHOLECYSTECTOMY  2011  . OVARIAN CYST REMOVAL  1986, 1988  . TONSILLECTOMY AND ADENOIDECTOMY  1973  . UPPER GASTROINTESTINAL ENDOSCOPY  2009   Gets them every 3 years    OB History    No data available       Home Medications    Prior to Admission medications   Medication Sig Start Date End Date Taking? Authorizing Provider  BD PEN NEEDLE NANO U/F 32G X 4 MM MISC USE AS DIRECTED 04/22/16   Sandford Craze, NP  estradiol (ESTRACE) 1 MG tablet TAKE 1 TABLET BY MOUTH ONCE DAILY 04/29/17   Sandford Craze, NP  gabapentin (NEURONTIN) 100 MG capsule Take 1 capsule (100 mg total) by mouth 3 (three) times daily. 01/06/17   Sandford Craze, NP  glucose blood (ONE TOUCH ULTRA TEST) test strip Use to check blood sugar every morning. 02/04/12   Sandford Craze, NP  Insulin Glargine (LANTUS SOLOSTAR) 100 UNIT/ML Solostar Pen Inject 5 Units into the skin daily at 10 pm. 10/13/16   Sandford Craze, NP  Iron Combinations (IRON COMPLEX PO) Take 75 mg by mouth daily.    [provider]  JANUMET XR 50-500 MG TB24 TAKE 2 TABLETS BY MOUTH ONCE DAILY 03/16/17   Sandford Craze, NP  lisinopril (PRINIVIL,ZESTRIL) 2.5  MG tablet TAKE 1 TABLET BY MOUTH ONCE DAILY 03/30/17   Sandford Craze'Sullivan, Melissa, NP  LIVALO 4 MG TABS TAKE 1 TABLET BY MOUTH ONCE DAILY 03/30/17   Sandford Craze'Sullivan, Melissa, NP  oxybutynin (DITROPAN-XL) 5 MG 24 hr tablet TAKE 1 TABLET BY MOUTH ONCE DAILY AT BEDTIME 04/30/17   Sandford Craze'Sullivan, Melissa, NP  pioglitazone (ACTOS) 30 MG tablet TAKE 1 TABLET BY MOUTH ONCE DAILY 04/30/17   Sandford Craze'Sullivan, Melissa, NP    Family History Family History  Problem Relation Age of Onset  . Kidney disease Mother   . Multiple sclerosis Mother   . Other Mother        polio  . Colon cancer Father   . Hyperlipidemia Father   .  Heart disease Father   . Hypertension Father   . Diabetes Maternal Aunt   . Heart disease Maternal Grandmother   . Hypertension Maternal Grandmother   . Diabetes Maternal Grandmother   . Heart disease Maternal Grandfather   . Hypertension Maternal Grandfather     Social History Social History   Tobacco Use  . Smoking status: Never Smoker  . Smokeless tobacco: Never Used  Substance Use Topics  . Alcohol use: No  . Drug use: No     Allergies   Aspirin; Codeine; Eggs or egg-derived products; Erythromycin; Fluticasone propionate; Penicillins; Prilosec [omeprazole magnesium]; Vaseline [petrolatum]; Vicodin [hydrocodone-acetaminophen]; and Zetia [ezetimibe]   Review of Systems Review of Systems  Musculoskeletal: Positive for arthralgias.  Skin: Positive for wound.  All other systems reviewed and are negative.    Physical Exam Updated Vital Signs BP 136/76 (BP Location: Right Arm)   Pulse 79   Temp 97.6 F (36.4 C) (Oral)   Resp 18   Ht 5\' 2"  (1.575 m)   Wt 52.2 kg (115 lb)   LMP 04/28/1998   SpO2 100%   BMI 21.03 kg/m   Physical Exam  Constitutional: She is oriented to person, place, and time. She appears well-developed and well-nourished.  HENT:  Head: Normocephalic.  Eyes: Conjunctivae are normal.  Neck: Neck supple.  Cardiovascular: Normal rate and regular rhythm.  Pulmonary/Chest: Effort normal and breath sounds normal.  Abdominal: Soft.  Musculoskeletal: Normal range of motion. She exhibits tenderness.  Neurological: She is alert and oriented to person, place, and time.  Skin: Skin is warm and dry.  Psychiatric: She has a normal mood and affect.  Nursing note and vitals reviewed.    ED Treatments / Results  Labs (all labs ordered are listed, but only abnormal results are displayed) Labs Reviewed - No data to display  EKG  EKG Interpretation None       Radiology Dg Finger Thumb Left  Result Date: 05/19/2017 CLINICAL DATA:  Fall with pain  in the left thumb. EXAM: LEFT THUMB 2+V COMPARISON:  None. FINDINGS: There is no evidence of fracture or dislocation. There is no evidence of arthropathy or other focal bone abnormality. Soft tissues are unremarkable IMPRESSION: Negative. Electronically Signed   By: Kennith CenterEric  Mansell M.D.   On: 05/19/2017 13:48    Procedures .Marland Kitchen.Laceration Repair Date/Time: 05/19/2017 4:13 PM Performed by: Felicie MornSmith, Mykah Shin, NP Authorized by: Felicie MornSmith, Lutricia Widjaja, NP   Consent:    Consent obtained:  Verbal   Consent given by:  Patient   Risks discussed:  Infection and poor cosmetic result   Alternatives discussed:  No treatment Anesthesia (see MAR for exact dosages):    Anesthesia method:  None Laceration details:    Location:  Face   Face location:  L eyebrow   Length (cm):  1 Repair type:    Repair type:  Simple Exploration:    Contaminated: no   Treatment:    Area cleansed with:  Saline   Amount of cleaning:  Standard Skin repair:    Repair method:  Tissue adhesive Approximation:    Approximation:  Close Post-procedure details:    Dressing:  Adhesive bandage   (including critical care time)  Medications Ordered in ED Medications - No data to display   Initial Impression / Assessment and Plan / ED Course  I have reviewed the triage vital signs and the nursing notes.  Pertinent labs & imaging results that were available during my care of the patient were reviewed by me and considered in my medical decision making (see chart for details).     Tetanus UTD. Laceration occurred < 12 hours prior to repair. Discussed laceration care with pt and answered questions. Wound closure with dermabond. Care instructions provided.   Patient X-Ray negative for obvious fracture or dislocation.  Pt advised to follow up with orthopedics. Patient given thumb spicewhile in ED, conservative therapy recommended and discussed. Patient will be discharged home & is agreeable with above plan. Returns precautions discussed. Pt  appears safe for discharge.  Final Clinical Impressions(s) / ED Diagnoses   Final diagnoses:  Fall, initial encounter  Thumb pain, left  Facial laceration, initial encounter    ED Discharge Orders    None       Felicie Morn, NP 05/19/17 1747    Rolland Porter, MD 05/19/17 [phone]

## 2017-06-10 ENCOUNTER — Ambulatory Visit (INDEPENDENT_AMBULATORY_CARE_PROVIDER_SITE_OTHER): Payer: Medicare Other | Admitting: Family

## 2017-06-10 ENCOUNTER — Ambulatory Visit (HOSPITAL_BASED_OUTPATIENT_CLINIC_OR_DEPARTMENT_OTHER)
Admission: RE | Admit: 2017-06-10 | Discharge: 2017-06-10 | Disposition: A | Discharge status: Home / Self Care | Payer: Medicare Other | Source: Ambulatory Visit | Attending: Family | Admitting: Family

## 2017-06-10 ENCOUNTER — Encounter: Payer: Self-pay | Admitting: Family

## 2017-06-10 VITALS — BP 127/75 | HR 91 | Temp 97.8°F | Resp 16 | Ht 63.0 in | Wt 120.0 lb

## 2017-06-10 DIAGNOSIS — G6289 Other specified polyneuropathies: Secondary | ICD-10-CM | POA: Diagnosis not present

## 2017-06-10 DIAGNOSIS — M25552 Pain in left hip: Secondary | ICD-10-CM

## 2017-06-10 DIAGNOSIS — S79912A Unspecified injury of left hip, initial encounter: Secondary | ICD-10-CM | POA: Diagnosis not present

## 2017-06-10 DIAGNOSIS — Z794 Long term (current) use of insulin: Secondary | ICD-10-CM

## 2017-06-10 DIAGNOSIS — E118 Type 2 diabetes mellitus with unspecified complications: Secondary | ICD-10-CM | POA: Diagnosis not present

## 2017-06-10 DIAGNOSIS — M7989 Other specified soft tissue disorders: Secondary | ICD-10-CM | POA: Diagnosis not present

## 2017-06-10 LAB — COMPREHENSIVE METABOLIC PANEL
ALT: 10 U/L (ref 0–35)
AST: 14 U/L (ref 0–37)
Albumin: 4.4 g/dL (ref 3.5–5.2)
Alkaline Phosphatase: 51 U/L (ref 39–117)
BILIRUBIN TOTAL: 0.5 mg/dL (ref 0.2–1.2)
BUN: 16 mg/dL (ref 6–23)
CO2: 32 meq/L (ref 19–32)
CREATININE: 0.78 mg/dL (ref 0.40–1.20)
Calcium: 10 mg/dL (ref 8.4–10.5)
Chloride: 97 mEq/L (ref 96–112)
GFR: 81.74 mL/min (ref >60.00)
Glucose, Bld: 129 mg/dL — ABNORMAL HIGH (ref 70–99)
POTASSIUM: 4.2 meq/L (ref 3.5–5.1)
Sodium: 134 mEq/L — ABNORMAL LOW (ref 135–145)
TOTAL PROTEIN: 7.7 g/dL (ref 6.0–8.3)

## 2017-06-10 LAB — LIPID PANEL
CHOL/HDL RATIO: 2
CHOLESTEROL: 161 mg/dL (ref 0–200)
HDL: 80 mg/dL (ref >39.00)
LDL CALC: 59 mg/dL (ref 0–99)
NonHDL: 81.27
TRIGLYCERIDES: 113 mg/dL (ref 0.0–149.0)
VLDL: 22.6 mg/dL (ref 0.0–40.0)

## 2017-06-10 LAB — HEMOGLOBIN A1C: HEMOGLOBIN A1C: 6.6 % — AB (ref 4.6–6.5)

## 2017-06-10 NOTE — Patient Instructions (Signed)
Please complete lab work prior to leaving. Complete x-ray on the first floor.  

## 2017-06-10 NOTE — Progress Notes (Signed)
Subjective:    Patient ID: Molly May, female    DOB: 20-Dec-1960, 57 y.o.   MRN: [id number]  HPI  [name] is a 57 yr old female who presents today for follow up.  1) DM2- reports that sugare have been "not that good due to stress."  Lab Results  Component Value Date   HGBA1C 6.7 (H) 10/13/2016   HGBA1C 6.5 07/11/2016   HGBA1C 6.7 (H) 04/11/2016   Lab Results  Component Value Date   MICROALBUR <0.7 07/09/2015   LDLCALC 78 04/11/2016   CREATININE 0.82 10/13/2016   2) L hip pain- began after fall in January.she hit her head and sprained her left thumb.  Pain in left hip only when I bend over, not when I take steps.   3) neuropathy- maintained  On gabapentin. Reports symptoms are stable.   Review of Systems See HPI  Past Medical History:  Diagnosis Date  . Allergy   . Anxiety   . Anxiety disorder 02/19/2011  . 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 Depression   . Diabetes mellitus   . GERD (gastroesophageal reflux disease)   . Heart murmur   . Hyperlipidemia   . Hypertension   . Kidney stone   . Migraine   . Mitral valve prolapse 02/19/2011  . Sleep apnea 02/19/2011  . Stomach ulcer 58yrs of age     Social History   Socioeconomic History  . Marital status: Single    Spouse name: Not on file  . Number of children: Not on file  . Years of education: Not on file  . Highest education level: Not on file  Social Needs  . Financial resource strain: Not on file  . Food insecurity - worry: Not on file  . Food insecurity - inability: Not on file  . Transportation needs - medical: Not on file  . Transportation needs - non-medical: Not on file  Occupational History  . Not on file  Tobacco Use  . Smoking status: Never Smoker  . Smokeless tobacco: Never Used  Substance and Sexual Activity  . Alcohol use: No  . Drug use: No  . Sexual activity: Not on file  Other Topics Concern  . Not on file  Social History Narrative   Regular exercise:  Yes. 3-5 x weekly    Caffeine Use:  No   Married- lives with husband, 2 children (age 91 and 68).   Disabled- due to deafness and overall work. Couldn't keep up.  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          Past Surgical History:  Procedure Laterality Date  . ABDOMINAL HYSTERECTOMY  2000  . APPENDECTOMY  1986  . BREAST SURGERY  2011   biposy--benign  . CATARACT EXTRACTION Right 10/13/11  . CATARACT EXTRACTION Left 03/2014  . CHOLECYSTECTOMY  2011  . OVARIAN CYST REMOVAL  1986, 1988  . TONSILLECTOMY AND ADENOIDECTOMY  1973  . UPPER GASTROINTESTINAL ENDOSCOPY  2009   Gets them every 3 years    Family History  Problem Relation Age of Onset  . Kidney disease Mother   . Multiple sclerosis Mother   . Other Mother        polio  . Colon cancer Father   . Hyperlipidemia Father   . Heart disease Father   . Hypertension Father   . Diabetes Maternal Aunt   . Heart disease Maternal Grandmother   .  Hypertension Maternal Grandmother   . Diabetes Maternal Grandmother   . Heart disease Maternal Grandfather   . Hypertension Maternal Grandfather     Allergies  Allergen Reactions  . Aspirin Other (See Comments)    Stomach pains  . Codeine Other (See Comments)    Nosebleed  . Eggs Or Egg-Derived Products     Pt reports allergy to RAW EGGS. ? Stomach upset, diarrhea  . Erythromycin Nausea And Vomiting  . Fluticasone Propionate     Stomach upset  . Penicillins Hives  . Prilosec [Omeprazole Magnesium]     migraine  . Vaseline [Petrolatum] Itching  . Vicodin [Hydrocodone-Acetaminophen]     twitching  . Zetia [Ezetimibe] Diarrhea    Current Outpatient Medications on File Prior to Visit  Medication Sig Dispense Refill  . BD PEN NEEDLE NANO U/F 32G X 4 MM MISC USE AS DIRECTED 100 each 4  . estradiol (ESTRACE) 1 MG tablet TAKE 1 TABLET BY MOUTH ONCE DAILY 90 tablet 1  . gabapentin (NEURONTIN) 100 MG capsule Take 1 capsule  (100 mg total) by mouth 3 (three) times daily. 90 capsule 1  . glucose blood (ONE TOUCH ULTRA TEST) test strip Use to check blood sugar every morning. 100 each 2  . Insulin Glargine (LANTUS SOLOSTAR) 100 UNIT/ML Solostar Pen Inject 5 Units into the skin daily at 10 pm. 1 pen 5  . Iron Combinations (IRON COMPLEX PO) Take 75 mg by mouth daily.    Marland Kitchen. JANUMET XR 50-500 MG TB24 TAKE 2 TABLETS BY MOUTH ONCE DAILY 180 tablet 1  . lisinopril (PRINIVIL,ZESTRIL) 2.5 MG tablet TAKE 1 TABLET BY MOUTH ONCE DAILY 90 tablet 0  . LIVALO 4 MG TABS TAKE 1 TABLET BY MOUTH ONCE DAILY 90 tablet 0  . oxybutynin (DITROPAN-XL) 5 MG 24 hr tablet TAKE 1 TABLET BY MOUTH ONCE DAILY AT BEDTIME 90 tablet 1  . pioglitazone (ACTOS) 30 MG tablet TAKE 1 TABLET BY MOUTH ONCE DAILY 90 tablet 1   No current facility-administered medications on file prior to visit.     BP 127/75 (BP Location: Right Arm, Patient Position: Sitting, Cuff Size: Small)   Pulse 91   Temp 97.8 F (36.6 C) (Oral)   Resp 16   Ht 5\' 3"  (1.6 m)   Wt 120 lb (54.4 kg)   LMP 04/28/1998   SpO2 100%   BMI 21.26 kg/m         Objective:   Physical Exam  Constitutional: She is oriented to person, place, and time. She appears well-developed and well-nourished.  HENT:  Head: Normocephalic.  Cardiovascular: Normal rate, regular rhythm and normal heart sounds.  No murmur heard. Pulmonary/Chest: Effort normal and breath sounds normal. No respiratory distress. She has no wheezes.  Musculoskeletal: She exhibits no edema.   Increased pain with abduction of left hip.  Left hip without tenderness or swelling noted  Neurological: She is alert and oriented to person, place, and time.  Psychiatric: She has a normal mood and affect. Her behavior is normal. Judgment and thought content normal.          Assessment & Plan:  Diabetes type 2- Lab Results  Component Value Date   HGBA1C 6.6 (H) 06/10/2017   Follow-up A1c is stable.  Continue current  meds.  Neuropathy-stable continue current dose of Neurontin.  Left hip pain- x-rays performed.  Negative for fracture.  Advised patient to call if symptoms worsen or if they do not improve in the next few weeks.  Would consider referral to sports medicine at that time.

## 2017-06-25 ENCOUNTER — Ambulatory Visit: Payer: Self-pay | Admitting: Family Medicine

## 2017-06-29 ENCOUNTER — Other Ambulatory Visit: Payer: Self-pay | Admitting: Family

## 2017-07-08 ENCOUNTER — Other Ambulatory Visit: Payer: Self-pay | Admitting: Family

## 2017-08-04 ENCOUNTER — Ambulatory Visit (INDEPENDENT_AMBULATORY_CARE_PROVIDER_SITE_OTHER): Payer: Medicare Other | Admitting: Family Medicine

## 2017-08-04 ENCOUNTER — Encounter: Payer: Self-pay | Admitting: Family Medicine

## 2017-08-04 VITALS — BP 127/80 | HR 76 | Ht 63.0 in | Wt 120.0 lb

## 2017-08-04 DIAGNOSIS — S6992XA Unspecified injury of left wrist, hand and finger(s), initial encounter: Secondary | ICD-10-CM | POA: Diagnosis not present

## 2017-08-04 DIAGNOSIS — M25542 Pain in joints of left hand: Secondary | ICD-10-CM

## 2017-08-04 DIAGNOSIS — M25561 Pain in right knee: Secondary | ICD-10-CM

## 2017-08-04 NOTE — Patient Instructions (Signed)
I'm concerned you may have torn a ligament in your thumb leading to persistent pain. We will go ahead with an MRI to further assess.  Your knee pain is due to arthritis. These are the different medications you can take for this: Tylenol 500mg  1-2 tabs three times a day for pain. Capsaicin, aspercreme, or biofreeze topically up to four times a day may also help with pain. Some supplements that may help for arthritis: Boswellia extract, curcumin, pycnogenol Aleve 1-2 tabs twice a day with food Cortisone injections are an option. If cortisone injections do not help, there are different types of shots that may help but they take longer to take effect. It's important that you continue to stay active. Straight leg raises, knee extensions 3 sets of 10 once a day (add ankle weight if these become too easy). Consider physical therapy to strengthen muscles around the joint that hurts to take pressure off of the joint itself. Shoe inserts with good arch support may be helpful. Heat or ice 15 minutes at a time 3-4 times a day as needed to help with pain. Water aerobics and cycling with low resistance are the best two types of exercise for arthritis though any exercise is ok as long as it doesn't worsen the pain. Follow up with me in 1 month.

## 2017-08-10 ENCOUNTER — Encounter: Payer: Self-pay | Admitting: Family Medicine

## 2017-08-10 DIAGNOSIS — M25561 Pain in right knee: Secondary | ICD-10-CM | POA: Insufficient documentation

## 2017-08-10 DIAGNOSIS — S6992XD Unspecified injury of left wrist, hand and finger(s), subsequent encounter: Secondary | ICD-10-CM | POA: Insufficient documentation

## 2017-08-10 NOTE — Progress Notes (Signed)
PCP: Sandford Craze, NP  Subjective:   HPI: Patient is a 57 y.o. female here for left thumb, right knee pain.  Sign language interpreter used for visit. Patient reports back in January she fell down and suffered an axial loading injury to her left thumb. She is continued to have swelling and pain to an 8 out of 10 level about the base of her left thumb. No current swelling. She has been using a brace and icing. This is more stiff in the morning. No skin changes, numbness. She is also been complaining of right knee pain for about 1 month is worse anteriorly and medially. Pain is 4-5 out of 10 level and sharp. Worse with walking feels like it locked up on her once. Worse when her stress is higher and with weather changes.  Past Medical History:  Diagnosis Date  . Allergy   . Anxiety   . Anxiety disorder 02/19/2011  . 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 Depression   . Diabetes mellitus   . GERD (gastroesophageal reflux disease)   . Heart murmur   . Hyperlipidemia   . Hypertension   . Kidney stone   . Migraine   . Mitral valve prolapse 02/19/2011  . Sleep apnea 02/19/2011  . Stomach ulcer 33yrs of age    Current Outpatient Medications on File Prior to Visit  Medication Sig Dispense Refill  . BD PEN NEEDLE NANO U/F 32G X 4 MM MISC USE AS DIRECTED 100 each 4  . estradiol (ESTRACE) 1 MG tablet TAKE 1 TABLET BY MOUTH ONCE DAILY 90 tablet 1  . gabapentin (NEURONTIN) 100 MG capsule TAKE 1 CAPSULE BY MOUTH THREE TIMES DAILY 270 capsule 0  . glucose blood (ONE TOUCH ULTRA TEST) test strip Use to check blood sugar every morning. 100 each 2  . Insulin Glargine (LANTUS SOLOSTAR) 100 UNIT/ML Solostar Pen Inject 5 Units into the skin daily at 10 pm. 1 pen 5  . Iron Combinations (IRON COMPLEX PO) Take 75 mg by mouth daily.    Marland Kitchen JANUMET XR 50-500 MG TB24 TAKE 2 TABLETS BY MOUTH ONCE DAILY 180 tablet 1  . lisinopril (PRINIVIL,ZESTRIL) 2.5 MG tablet TAKE 1 TABLET BY MOUTH ONCE DAILY 90  tablet 0  . LIVALO 4 MG TABS TAKE 1 TABLET BY MOUTH ONCE DAILY 90 tablet 0  . oxybutynin (DITROPAN-XL) 5 MG 24 hr tablet TAKE 1 TABLET BY MOUTH ONCE DAILY AT BEDTIME 90 tablet 1  . pioglitazone (ACTOS) 30 MG tablet TAKE 1 TABLET BY MOUTH ONCE DAILY 90 tablet 1   No current facility-administered medications on file prior to visit.     Past Surgical History:  Procedure Laterality Date  . ABDOMINAL HYSTERECTOMY  2000  . APPENDECTOMY  1986  . BREAST SURGERY  2011   biposy--benign  . CATARACT EXTRACTION Right 10/13/11  . CATARACT EXTRACTION Left 03/2014  . CHOLECYSTECTOMY  2011  . OVARIAN CYST REMOVAL  1986, 1988  . TONSILLECTOMY AND ADENOIDECTOMY  1973  . UPPER GASTROINTESTINAL ENDOSCOPY  2009   Gets them every 3 years    Allergies  Allergen Reactions  . Aspirin Other (See Comments)    Stomach pains  . Codeine Other (See Comments)    Nosebleed  . Eggs Or Egg-Derived Products     Pt reports allergy to RAW EGGS. ? Stomach upset, diarrhea  . Erythromycin Nausea And Vomiting  . Fluticasone Propionate     Stomach upset  . Penicillins Hives  . Prilosec [Omeprazole Magnesium]  migraine  . Vaseline [Petrolatum] Itching  . Vicodin [Hydrocodone-Acetaminophen]     twitching  . Zetia [Ezetimibe] Diarrhea    Social History   Socioeconomic History  . Marital status: Single    Spouse name: Not on file  . Number of children: Not on file  . Years of education: Not on file  . Highest education level: Not on file  Occupational History  . Not on file  Social Needs  . Financial resource strain: Not on file  . Food insecurity:    Worry: Not on file    Inability: Not on file  . Transportation needs:    Medical: Not on file    Non-medical: Not on file  Tobacco Use  . Smoking status: Never Smoker  . Smokeless tobacco: Never Used  Substance and Sexual Activity  . Alcohol use: No  . Drug use: No  . Sexual activity: Not on file  Lifestyle  . Physical activity:    Days per  week: Not on file    Minutes per session: Not on file  . Stress: Not on file  Relationships  . Social connections:    Talks on phone: Not on file    Gets together: Not on file    Attends religious service: Not on file    Active member of club or organization: Not on file    Attends meetings of clubs or organizations: Not on file    Relationship status: Not on file  . Intimate partner violence:    Fear of current or ex partner: Not on file    Emotionally abused: Not on file    Physically abused: Not on file    Forced sexual activity: Not on file  Other Topics Concern  . Not on file  Social History Narrative   Regular exercise:  Yes. 3-5 x weekly   Caffeine Use:  No   Married- lives with husband, 2 children (age 40 and 41).   Disabled- due to deafness and overall work. Couldn't keep up.  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          Family History  Problem Relation Age of Onset  . Kidney disease Mother   . Multiple sclerosis Mother   . Other Mother        polio  . Colon cancer Father   . Hyperlipidemia Father   . Heart disease Father   . Hypertension Father   . Diabetes Maternal Aunt   . Heart disease Maternal Grandmother   . Hypertension Maternal Grandmother   . Diabetes Maternal Grandmother   . Heart disease Maternal Grandfather   . Hypertension Maternal Grandfather     BP 127/80   Pulse 76   Ht 5\' 3"  (1.6 m)   Wt 120 lb (54.4 kg)   LMP 04/28/1998   BMI 21.26 kg/m   Review of Systems: See HPI above.     Objective:  Physical Exam:  Gen: NAD, comfortable in exam room  Left thumb: No gross deformity, swelling, bruising, angulation or malrotation. Tenderness to palpation over the UCL of MCP joint.  No other tenderness of thumb. Full range of motion with 5 out of 5 strength in flexion and extension at IP joint, MCP joint. Radial collateral ligaments intact.  Laxity on testing of  UCL of MCP. Neurovascularly intact distally.  Right knee: No gross deformity, ecchymoses, swelling. TTP medial joint line.  No  other tenderness. FROM. Negative ant/post drawers. Negative valgus/varus testing. Negative lachmanns. Negative mcmurrays, apleys, patellar apprehension. NV intact distally.   Assessment & Plan:  1. Left thumb injury - independently reviewed radiographs and no evidence fracture.  Injury happened back in January and continues to have pain and evidence of laxity on exam.  Concerning for gamekeeper's thumb.  Given length of time she has had symptoms and this laxity we will go ahead with an MRI to further assess.  Thumb keeper brace in the meantime.  2.  Right knee pain -consistent with arthritis.  We discussed Tylenol, topical medications, supplements, Aleve.  She will consider cortisone injections, physical therapy.  Shown some home exercises to do daily.  Heat or ice if needed.  Follow-up in 1 month.

## 2017-08-10 NOTE — Assessment & Plan Note (Signed)
consistent with arthritis.  We discussed Tylenol, topical medications, supplements, Aleve.  She will consider cortisone injections, physical therapy.  Shown some home exercises to do daily.  Heat or ice if needed.  Follow-up in 1 month.

## 2017-08-10 NOTE — Assessment & Plan Note (Signed)
independently reviewed radiographs and no evidence fracture.  Injury happened back in January and continues to have pain and evidence of laxity on exam.  Concerning for gamekeeper's thumb.  Given length of time she has had symptoms and this laxity we will go ahead with an MRI to further assess.  Thumb keeper brace in the meantime.

## 2017-08-24 ENCOUNTER — Ambulatory Visit (INDEPENDENT_AMBULATORY_CARE_PROVIDER_SITE_OTHER): Payer: Medicare Other

## 2017-08-24 DIAGNOSIS — M25542 Pain in joints of left hand: Secondary | ICD-10-CM

## 2017-08-24 DIAGNOSIS — M79642 Pain in left hand: Secondary | ICD-10-CM | POA: Diagnosis not present

## 2017-09-03 ENCOUNTER — Ambulatory Visit (INDEPENDENT_AMBULATORY_CARE_PROVIDER_SITE_OTHER): Payer: Medicare Other | Admitting: Family Medicine

## 2017-09-03 ENCOUNTER — Encounter: Payer: Self-pay | Admitting: Family Medicine

## 2017-09-03 DIAGNOSIS — M25561 Pain in right knee: Secondary | ICD-10-CM | POA: Diagnosis not present

## 2017-09-03 DIAGNOSIS — S6992XD Unspecified injury of left wrist, hand and finger(s), subsequent encounter: Secondary | ICD-10-CM

## 2017-09-03 NOTE — Patient Instructions (Signed)
We will go ahead with referral to Dr. Amanda Pea or Dr. Melvyn Novas for your RCL tear of your thumb. Wear the brace regularly. Icing if needed for this. Follow up with me as needed for this and for your knee.

## 2017-09-04 ENCOUNTER — Encounter: Payer: Self-pay | Admitting: Family Medicine

## 2017-09-04 NOTE — Progress Notes (Signed)
PCP: Sandford Craze, NP  Subjective:   HPI: Patient is a 57 y.o. female here for left thumb, right knee pain.  4/9: Sign language interpreter used for visit. Patient reports back in January she fell down and suffered an axial loading injury to her left thumb. She is continued to have swelling and pain to an 8 out of 10 level about the base of her left thumb. No current swelling. She has been using a brace and icing. This is more stiff in the morning. No skin changes, numbness. She is also been complaining of right knee pain for about 1 month is worse anteriorly and medially. Pain is 4-5 out of 10 level and sharp. Worse with walking feels like it locked up on her once. Worse when her stress is higher and with weather changes.  5/9: Patient reports her right knee is much better. Still dealing with pain of left thumb though. Difficulty gripping items due to pain. No skin changes. Wearing thumbkeeper but not currently.  Past Medical History:  Diagnosis Date  . Allergy   . Anxiety   . Anxiety disorder 02/19/2011  . 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 Depression   . Diabetes mellitus   . GERD (gastroesophageal reflux disease)   . Heart murmur   . Hyperlipidemia   . Hypertension   . Kidney stone   . Migraine   . Mitral valve prolapse 02/19/2011  . Sleep apnea 02/19/2011  . Stomach ulcer 16yrs of age    Current Outpatient Medications on File Prior to Visit  Medication Sig Dispense Refill  . BD PEN NEEDLE NANO U/F 32G X 4 MM MISC USE AS DIRECTED 100 each 4  . estradiol (ESTRACE) 1 MG tablet TAKE 1 TABLET BY MOUTH ONCE DAILY 90 tablet 1  . gabapentin (NEURONTIN) 100 MG capsule TAKE 1 CAPSULE BY MOUTH THREE TIMES DAILY 270 capsule 0  . glucose blood (ONE TOUCH ULTRA TEST) test strip Use to check blood sugar every morning. 100 each 2  . Insulin Glargine (LANTUS SOLOSTAR) 100 UNIT/ML Solostar Pen Inject 5 Units into the skin daily at 10 pm. 1 pen 5  . Iron Combinations  (IRON COMPLEX PO) Take 75 mg by mouth daily.    Marland Kitchen JANUMET XR 50-500 MG TB24 TAKE 2 TABLETS BY MOUTH ONCE DAILY 180 tablet 1  . lisinopril (PRINIVIL,ZESTRIL) 2.5 MG tablet TAKE 1 TABLET BY MOUTH ONCE DAILY 90 tablet 0  . LIVALO 4 MG TABS TAKE 1 TABLET BY MOUTH ONCE DAILY 90 tablet 0  . oxybutynin (DITROPAN-XL) 5 MG 24 hr tablet TAKE 1 TABLET BY MOUTH ONCE DAILY AT BEDTIME 90 tablet 1  . pioglitazone (ACTOS) 30 MG tablet TAKE 1 TABLET BY MOUTH ONCE DAILY 90 tablet 1   No current facility-administered medications on file prior to visit.     Past Surgical History:  Procedure Laterality Date  . ABDOMINAL HYSTERECTOMY  2000  . APPENDECTOMY  1986  . BREAST SURGERY  2011   biposy--benign  . CATARACT EXTRACTION Right 10/13/11  . CATARACT EXTRACTION Left 03/2014  . CHOLECYSTECTOMY  2011  . OVARIAN CYST REMOVAL  1986, 1988  . TONSILLECTOMY AND ADENOIDECTOMY  1973  . UPPER GASTROINTESTINAL ENDOSCOPY  2009   Gets them every 3 years    Allergies  Allergen Reactions  . Aspirin Other (See Comments)    Stomach pains  . Codeine Other (See Comments)    Nosebleed  . Eggs Or Egg-Derived Products     Pt reports allergy to RAW  EGGS. ? Stomach upset, diarrhea  . Erythromycin Nausea And Vomiting  . Fluticasone Propionate     Stomach upset  . Penicillins Hives  . Prilosec [Omeprazole Magnesium]     migraine  . Vaseline [Petrolatum] Itching  . Vicodin [Hydrocodone-Acetaminophen]     twitching  . Zetia [Ezetimibe] Diarrhea    Social History   Socioeconomic History  . Marital status: Single    Spouse name: Not on file  . Number of children: Not on file  . Years of education: Not on file  . Highest education level: Not on file  Occupational History  . Not on file  Social Needs  . Financial resource strain: Not on file  . Food insecurity:    Worry: Not on file    Inability: Not on file  . Transportation needs:    Medical: Not on file    Non-medical: Not on file  Tobacco Use  . Smoking  status: Never Smoker  . Smokeless tobacco: Never Used  Substance and Sexual Activity  . Alcohol use: No  . Drug use: No  . Sexual activity: Not on file  Lifestyle  . Physical activity:    Days per week: Not on file    Minutes per session: Not on file  . Stress: Not on file  Relationships  . Social connections:    Talks on phone: Not on file    Gets together: Not on file    Attends religious service: Not on file    Active member of club or organization: Not on file    Attends meetings of clubs or organizations: Not on file    Relationship status: Not on file  . Intimate partner violence:    Fear of current or ex partner: Not on file    Emotionally abused: Not on file    Physically abused: Not on file    Forced sexual activity: Not on file  Other Topics Concern  . Not on file  Social History Narrative   Regular exercise:  Yes. 3-5 x weekly   Caffeine Use:  No   Married- lives with husband, 2 children (age 9 and 55).   Disabled- due to deafness and overall work. Couldn't keep up.  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          Family History  Problem Relation Age of Onset  . Kidney disease Mother   . Multiple sclerosis Mother   . Other Mother        polio  . Colon cancer Father   . Hyperlipidemia Father   . Heart disease Father   . Hypertension Father   . Diabetes Maternal Aunt   . Heart disease Maternal Grandmother   . Hypertension Maternal Grandmother   . Diabetes Maternal Grandmother   . Heart disease Maternal Grandfather   . Hypertension Maternal Grandfather     BP 118/73   Pulse 91   Ht  (1.6 m)   Wt 120 lb (54.4 kg)   LMP 04/28/1998   BMI 21.26 kg/m   Review of Systems: See HPI above.     Objective:  Physical Exam:  Gen: NAD, comfortable in exam room  Left thumb: No deformity, swelling, bruising, malrotation or angulation. TTP over RCL MCP joint.  No other  tenderness. FROM with 5/5 strength flexion and extension at IP, MCP joints. Pain on testing RCL of MCP joint with mild laxity.  No laxity on UCL testing. NVI distally.  RIght knee: No gross deformity, ecchymoses, swelling. No TTP. FROM. Negative ant/post drawers. Negative valgus/varus testing. Negative lachmanns. Negative mcmurrays, apleys, patellar apprehension. NV intact distally.   Assessment & Plan:  1. Left thumb injury - axial loading injury.  Radiographs negative.  MRI reviewed combined with her exam suggestive of high grade partial tear of RCL.  Continue thumbkeeper brace.  Refer to hand surgery to discuss repair.  Icing, tylenol if needed.  2. Right knee pain - 2/2 arthritis.  Much improved.  Tylenol, topical medications, supplements, aleve if needed.  Consider injection, PT if recurs.  F/u prn.

## 2017-09-04 NOTE — Assessment & Plan Note (Signed)
2/2 arthritis.  Much improved.  Tylenol, topical medications, supplements, aleve if needed.  Consider injection, PT if recurs.  F/u prn.

## 2017-09-04 NOTE — Assessment & Plan Note (Signed)
axial loading injury.  Radiographs negative.  MRI reviewed combined with her exam suggestive of high grade partial tear of RCL.  Continue thumbkeeper brace.  Refer to hand surgery to discuss repair.  Icing, tylenol if needed.

## 2017-09-08 ENCOUNTER — Encounter: Payer: Self-pay | Admitting: Family

## 2017-09-08 ENCOUNTER — Ambulatory Visit (INDEPENDENT_AMBULATORY_CARE_PROVIDER_SITE_OTHER): Payer: Medicare Other | Admitting: Family

## 2017-09-08 VITALS — BP 123/68 | HR 79 | Temp 97.9°F | Resp 16 | Ht 63.0 in | Wt 122.2 lb

## 2017-09-08 DIAGNOSIS — R5383 Other fatigue: Secondary | ICD-10-CM | POA: Diagnosis not present

## 2017-09-08 DIAGNOSIS — E119 Type 2 diabetes mellitus without complications: Secondary | ICD-10-CM

## 2017-09-08 NOTE — Patient Instructions (Signed)
Please complete lab work prior to leaving.   

## 2017-09-08 NOTE — Progress Notes (Signed)
Subjective:    Patient ID: Molly May, female    DOB: 12/09/1960, 56 y.o.   MRN: [id number]  HPI  [name] is a 57 yr old female who presents today for follow up.  DM2- reports that her sugars are "fine." denies hypoglycemia.   Lab Results  Component Value Date   HGBA1C 6.6 (H) 06/10/2017   HGBA1C 6.7 (H) 10/13/2016   HGBA1C 6.5 07/11/2016   Lab Results  Component Value Date   MICROALBUR <0.7 07/09/2015   LDLCALC 59 06/10/2017   CREATININE 0.78 06/10/2017   Fatigue- reports feeling more fatigued over the last 1 week. Denies SOB or chest pain.   Review of Systems See HPI  Past Medical History:  Diagnosis Date  . Allergy   . Anxiety   . Anxiety disorder 02/19/2011  . 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 Depression   . Diabetes mellitus   . GERD (gastroesophageal reflux disease)   . Heart murmur   . Hyperlipidemia   . Hypertension   . Kidney stone   . Migraine   . Mitral valve prolapse 02/19/2011  . Sleep apnea 02/19/2011  . Stomach ulcer 48yrs of age     Social History   Socioeconomic History  . Marital status: Single    Spouse name: Not on file  . Number of children: Not on file  . Years of education: Not on file  . Highest education level: Not on file  Occupational History  . Not on file  Social Needs  . Financial resource strain: Not on file  . Food insecurity:    Worry: Not on file    Inability: Not on file  . Transportation needs:    Medical: Not on file    Non-medical: Not on file  Tobacco Use  . Smoking status: Never Smoker  . Smokeless tobacco: Never Used  Substance and Sexual Activity  . Alcohol use: No  . Drug use: No  . Sexual activity: Not on file  Lifestyle  . Physical activity:    Days per week: Not on file    Minutes per session: Not on file  . Stress: Not on file  Relationships  . Social connections:    Talks on phone: Not on file    Gets together: Not on file    Attends religious service: Not on file    Active  member of club or organization: Not on file    Attends meetings of clubs or organizations: Not on file    Relationship status: Not on file  . Intimate partner violence:    Fear of current or ex partner: Not on file    Emotionally abused: Not on file    Physically abused: Not on file    Forced sexual activity: Not on file  Other Topics Concern  . Not on file  Social History Narrative   Regular exercise:  Yes. 3-5 x weekly   Caffeine Use:  No   Married- lives with husband, 2 children (age 29 and 62).   Disabled- due to deafness and overall work. Couldn't keep up.  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          Past Surgical History:  Procedure Laterality Date  . ABDOMINAL HYSTERECTOMY  2000  . APPENDECTOMY  1986  . BREAST SURGERY  2011   biposy--benign  . CATARACT EXTRACTION Right 10/13/11  . CATARACT EXTRACTION Left 03/2014  .  CHOLECYSTECTOMY  2011  . OVARIAN CYST REMOVAL  1986, 1988  . TONSILLECTOMY AND ADENOIDECTOMY  1973  . UPPER GASTROINTESTINAL ENDOSCOPY  2009   Gets them every 3 years    Family History  Problem Relation Age of Onset  . Kidney disease Mother   . Multiple sclerosis Mother   . Other Mother        polio  . Colon cancer Father   . Hyperlipidemia Father   . Heart disease Father   . Hypertension Father   . Diabetes Maternal Aunt   . Heart disease Maternal Grandmother   . Hypertension Maternal Grandmother   . Diabetes Maternal Grandmother   . Heart disease Maternal Grandfather   . Hypertension Maternal Grandfather     Allergies  Allergen Reactions  . Aspirin Other (See Comments)    Stomach pains  . Codeine Other (See Comments)    Nosebleed  . Eggs Or Egg-Derived Products     Pt reports allergy to RAW EGGS. ? Stomach upset, diarrhea  . Erythromycin Nausea And Vomiting  . Fluticasone Propionate     Stomach upset  . Penicillins Hives  . Prilosec [Omeprazole Magnesium]      migraine  . Vaseline [Petrolatum] Itching  . Vicodin [Hydrocodone-Acetaminophen]     twitching  . Zetia [Ezetimibe] Diarrhea    Current Outpatient Medications on File Prior to Visit  Medication Sig Dispense Refill  . BD PEN NEEDLE NANO U/F 32G X 4 MM MISC USE AS DIRECTED 100 each 4  . estradiol (ESTRACE) 1 MG tablet TAKE 1 TABLET BY MOUTH ONCE DAILY 90 tablet 1  . gabapentin (NEURONTIN) 100 MG capsule TAKE 1 CAPSULE BY MOUTH THREE TIMES DAILY 270 capsule 0  . glucose blood (ONE TOUCH ULTRA TEST) test strip Use to check blood sugar every morning. 100 each 2  . Insulin Glargine (LANTUS SOLOSTAR) 100 UNIT/ML Solostar Pen Inject 5 Units into the skin daily at 10 pm. 1 pen 5  . Iron Combinations (IRON COMPLEX PO) Take 75 mg by mouth daily.    Marland Kitchen JANUMET XR 50-500 MG TB24 TAKE 2 TABLETS BY MOUTH ONCE DAILY 180 tablet 1  . lisinopril (PRINIVIL,ZESTRIL) 2.5 MG tablet TAKE 1 TABLET BY MOUTH ONCE DAILY 90 tablet 0  . LIVALO 4 MG TABS TAKE 1 TABLET BY MOUTH ONCE DAILY 90 tablet 0  . oxybutynin (DITROPAN-XL) 5 MG 24 hr tablet TAKE 1 TABLET BY MOUTH ONCE DAILY AT BEDTIME 90 tablet 1  . pioglitazone (ACTOS) 30 MG tablet TAKE 1 TABLET BY MOUTH ONCE DAILY 90 tablet 1   No current facility-administered medications on file prior to visit.     BP 123/68 (BP Location: Left Arm, Patient Position: Sitting, Cuff Size: Small)   Pulse 79   Temp 97.9 F (36.6 C) (Oral)   Resp 16   Ht  (1.6 m)   Wt 122 lb 3.2 oz (55.4 kg)   LMP 04/28/1998   SpO2 100%   BMI 21.65 kg/m       Objective:   Physical Exam  Constitutional: She appears well-developed and well-nourished.  Cardiovascular: Normal rate, regular rhythm and normal heart sounds.  No murmur heard. Pulmonary/Chest: Effort normal and breath sounds normal. No respiratory distress. She has no wheezes.  Psychiatric: She has a normal mood and affect. Her behavior is normal. Judgment and thought content normal.          Assessment & Plan:    DM2- clinically stable. She will return for labs including  A1C (<90 days since last draw). Continue janumet/actos.   Fatigue- new. Check CBC and TSH.

## 2017-09-10 DIAGNOSIS — S5321XA Traumatic rupture of right radial collateral ligament, initial encounter: Secondary | ICD-10-CM | POA: Diagnosis not present

## 2017-09-14 ENCOUNTER — Other Ambulatory Visit (INDEPENDENT_AMBULATORY_CARE_PROVIDER_SITE_OTHER): Payer: Medicare Other

## 2017-09-14 ENCOUNTER — Encounter: Payer: Self-pay | Admitting: Family

## 2017-09-14 DIAGNOSIS — R5383 Other fatigue: Secondary | ICD-10-CM

## 2017-09-14 DIAGNOSIS — E119 Type 2 diabetes mellitus without complications: Secondary | ICD-10-CM

## 2017-09-14 LAB — CBC WITH DIFFERENTIAL/PLATELET
BASOS ABS: 0 10*3/uL (ref 0.0–0.1)
BASOS PCT: 0.3 % (ref 0.0–3.0)
EOS PCT: 1.4 % (ref 0.0–5.0)
Eosinophils Absolute: 0.1 10*3/uL (ref 0.0–0.7)
HCT: 37 % (ref 36.0–46.0)
Hemoglobin: 12.4 g/dL (ref 12.0–15.0)
LYMPHS ABS: 2.3 10*3/uL (ref 0.7–4.0)
Lymphocytes Relative: 33.9 % (ref 12.0–46.0)
MCHC: 33.4 g/dL (ref 30.0–36.0)
MCV: 88.1 fl (ref 78.0–100.0)
MONOS PCT: 6.8 % (ref 3.0–12.0)
Monocytes Absolute: 0.5 10*3/uL (ref 0.1–1.0)
NEUTROS ABS: 3.9 10*3/uL (ref 1.4–7.7)
NEUTROS PCT: 57.6 % (ref 43.0–77.0)
PLATELETS: 229 10*3/uL (ref 150.0–400.0)
RBC: 4.21 Mil/uL (ref 3.87–5.11)
RDW: 14.4 % (ref 11.5–15.5)
WBC: 6.7 10*3/uL (ref 4.0–10.5)

## 2017-09-14 LAB — BASIC METABOLIC PANEL
BUN: 17 mg/dL (ref 6–23)
CALCIUM: 9.2 mg/dL (ref 8.4–10.5)
CO2: 27 mEq/L (ref 19–32)
CREATININE: 0.73 mg/dL (ref 0.40–1.20)
Chloride: 98 mEq/L (ref 96–112)
GFR: 88.15 mL/min (ref >60.00)
Glucose, Bld: 129 mg/dL — ABNORMAL HIGH (ref 70–99)
Potassium: 4 mEq/L (ref 3.5–5.1)
Sodium: 135 mEq/L (ref 135–145)

## 2017-09-14 LAB — HEMOGLOBIN A1C: Hgb A1c MFr Bld: 6.5 % (ref 4.6–6.5)

## 2017-09-14 LAB — TSH: TSH: 1.5 u[IU]/mL (ref 0.35–4.50)

## 2017-09-15 DIAGNOSIS — S5322XD Traumatic rupture of left radial collateral ligament, subsequent encounter: Secondary | ICD-10-CM | POA: Diagnosis not present

## 2017-09-28 ENCOUNTER — Other Ambulatory Visit: Payer: Self-pay | Admitting: Family

## 2017-10-02 ENCOUNTER — Telehealth: Payer: Self-pay | Admitting: Family

## 2017-10-02 MED ORDER — SITAGLIP PHOS-METFORMIN HCL ER 50-500 MG PO TB24: 2.0000 | ORAL_TABLET | Freq: Every day | ORAL | 1 refills | Status: DC

## 2017-10-02 NOTE — Telephone Encounter (Signed)
Copied from CRM [id number]. Topic: Quick Communication - Rx Refill/Question >> Oct 02, 2017  8:53 AM Percival SpanishKennedy, Cheryl W wrote: Medication  JANUMET XR 50-500 MG TB24  Has the patient contacted their pharmacy yes    Preferred Pharmacy   [facility] [address] [place]   Agent: Please be advised that RX refills may take up to 3 business days. We ask that you follow-up with your pharmacy.

## 2017-10-02 NOTE — Telephone Encounter (Signed)
Rx refill request for Jamuet XR 50 500mg .   ///  Last refill 03/16/17 #180   /// LOV  09/08/17  With Val EagleLendell Caprice' [facility]   [facility]  [address]  :

## 2017-10-28 ENCOUNTER — Ambulatory Visit (INDEPENDENT_AMBULATORY_CARE_PROVIDER_SITE_OTHER): Payer: Medicare Other | Admitting: Internal Medicine

## 2017-10-28 ENCOUNTER — Encounter: Payer: Self-pay | Admitting: Internal Medicine

## 2017-10-28 VITALS — BP 142/80 | HR 107 | Temp 98.0°F | Resp 16 | Ht 63.0 in | Wt 122.2 lb

## 2017-10-28 DIAGNOSIS — J40 Bronchitis, not specified as acute or chronic: Secondary | ICD-10-CM | POA: Diagnosis not present

## 2017-10-28 MED ORDER — DOXYCYCLINE HYCLATE 100 MG PO TABS: 100.0000 mg | ORAL_TABLET | Freq: Two times a day (BID) | ORAL | 0 refills | Status: DC

## 2017-10-28 MED ORDER — BENZONATATE 200 MG PO CAPS: 200.0000 mg | ORAL_CAPSULE | Freq: Three times a day (TID) | ORAL | 0 refills | Status: DC | PRN

## 2017-10-28 NOTE — Progress Notes (Signed)
Pre visit review using our clinic review tool, if applicable. No additional management support is needed unless otherwise documented below in the visit note. 

## 2017-10-28 NOTE — Patient Instructions (Addendum)
Rest, fluids , tylenol  For cough:  Take  Robitussin as needed until better Also take TESSALON perles if needed   Avoid decongestants such as  Pseudoephedrine or phenylephrine   If nasal congestion: Use OTC Nasocort or Flonase : 2 nasal sprays on each side of the nose in the morning until you feel better  Take the antibiotic as prescribed  (doxycycline)  Call if not gradually better over the next  10 days  Call anytime if the symptoms are severe

## 2017-10-28 NOTE — Progress Notes (Signed)
Subjective:    Patient ID: Molly May, female    DOB: 12/29/1960, 57 y.o.   MRN: [id number]  DOS:  10/28/2017 Type of visit - description : Acute visit, here with a sign language interpreter Interval history: Reports that her family had the same symptoms. The patient herself started approximately 4 days ago with ear ache bilaterally, raspy voice, runny nose, sneezing, cough associated with chest pain anteriorly. She had nausea and vomited x1 but no further GI symptoms. She is taking Tylenol and  other OTC, has noted that her blood pressure has increased to some extent, up in the 180s one time.  BP today here is satisfactory.   Review of Systems Denies chills, did have some fever at the beginning. Mild aches and pains Good p.o. tolerance, appetite remains healthy She did have some sputum production, initially clear now yellowish to green.  Past Medical History:  Diagnosis Date  . Allergy   . Anxiety   . Anxiety disorder 02/19/2011  . 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 Depression   . Diabetes mellitus   . GERD (gastroesophageal reflux disease)   . Heart murmur   . Hyperlipidemia   . Hypertension   . Kidney stone   . Migraine   . Mitral valve prolapse 02/19/2011  . Sleep apnea 02/19/2011  . Stomach ulcer [age]yrs of age    Past Surgical History:  Procedure Laterality Date  . ABDOMINAL HYSTERECTOMY  2000  . APPENDECTOMY  1986  . BREAST SURGERY  2011   biposy--benign  . CATARACT EXTRACTION Right 10/13/11  . CATARACT EXTRACTION Left 03/2014  . CHOLECYSTECTOMY  2011  . OVARIAN CYST REMOVAL  1986, 1988  . TONSILLECTOMY AND ADENOIDECTOMY  1973  . UPPER GASTROINTESTINAL ENDOSCOPY  2009   Gets them every 3 years    Social History   Socioeconomic History  . Marital status: Single    Spouse name: Not on file  . Number of children: Not on file  . Years of education: Not on file  . Highest education level: Not on file  Occupational History  . Not on file  Social  Needs  . Financial resource strain: Not on file  . Food insecurity:    Worry: Not on file    Inability: Not on file  . Transportation needs:    Medical: Not on file    Non-medical: Not on file  Tobacco Use  . Smoking status: Never Smoker  . Smokeless tobacco: Never Used  Substance and Sexual Activity  . Alcohol use: No  . Drug use: No  . Sexual activity: Not on file  Lifestyle  . Physical activity:    Days per week: Not on file    Minutes per session: Not on file  . Stress: Not on file  Relationships  . Social connections:    Talks on phone: Not on file    Gets together: Not on file    Attends religious service: Not on file    Active member of club or organization: Not on file    Attends meetings of clubs or organizations: Not on file    Relationship status: Not on file  . Intimate partner violence:    Fear of current or ex partner: Not on file    Emotionally abused: Not on file    Physically abused: Not on file    Forced sexual activity: Not on file  Other Topics Concern  . Not on file  Social History Narrative   Regular exercise:  Yes. 3-5 x weekly   Caffeine Use:  No   Married- lives with husband, 2 children (age 93 and 22).   Disabled- due to deafness and overall work. Couldn't keep up.  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            Allergies as of 10/28/2017      Reactions   Aspirin Other (See Comments)   Stomach pains   Codeine Other (See Comments)   Nosebleed   Eggs Or Egg-derived Products    Pt reports allergy to RAW EGGS. ? Stomach upset, diarrhea   Erythromycin Nausea And Vomiting   Fluticasone Propionate    Stomach upset   Penicillins Hives   Prilosec [omeprazole Magnesium]    migraine   Vaseline [petrolatum] Itching   Vicodin [hydrocodone-acetaminophen]    twitching   Zetia [ezetimibe] Diarrhea      Medication List        Accurate as of 10/28/17 11:59 PM. Always  use your most recent med list.          BD PEN NEEDLE NANO U/F 32G X 4 MM Misc Generic drug:  Insulin Pen Needle USE AS DIRECTED   benzonatate 200 MG capsule Commonly known as:  TESSALON Take 1 capsule (200 mg total) by mouth 3 (three) times daily as needed for cough.   doxycycline 100 MG tablet Commonly known as:  VIBRA-TABS Take 1 tablet (100 mg total) by mouth 2 (two) times daily.   estradiol 1 MG tablet Commonly known as:  ESTRACE TAKE 1 TABLET BY MOUTH ONCE DAILY   gabapentin 100 MG capsule Commonly known as:  NEURONTIN TAKE 1 CAPSULE BY MOUTH THREE TIMES DAILY   glucose blood test strip Commonly known as:  ONE TOUCH ULTRA TEST Use to check blood sugar every morning.   Insulin Glargine 100 UNIT/ML Solostar Pen Commonly known as:  LANTUS SOLOSTAR Inject 5 Units into the skin daily at 10 pm.   IRON COMPLEX PO Take 75 mg by mouth daily.   lisinopril 2.5 MG tablet Commonly known as:  PRINIVIL,ZESTRIL TAKE 1 TABLET BY MOUTH ONCE DAILY   LIVALO 4 MG Tabs Generic drug:  Pitavastatin Calcium TAKE 1 TABLET BY MOUTH ONCE DAILY   oxybutynin 5 MG 24 hr tablet Commonly known as:  DITROPAN-XL TAKE 1 TABLET BY MOUTH ONCE DAILY AT BEDTIME   pioglitazone 30 MG tablet Commonly known as:  ACTOS TAKE 1 TABLET BY MOUTH ONCE DAILY   SitaGLIPtin-MetFORMIN HCl 50-500 MG Tb24 Commonly known as:  JANUMET XR Take 2 tablets by mouth daily.          Objective:   Physical Exam BP (!) 142/80 (BP Location: Right Arm, Patient Position: Sitting, Cuff Size: Small)   Pulse (!) 107   Temp 98 F (36.7 C) (Oral)   Resp 16   Ht 5\' 3"  (1.6 m)   Wt 122 lb 4 oz (55.5 kg)   LMP 04/28/1998   SpO2 98%   BMI 21.66 kg/m  General:   Well developed, NAD, see BMI.  HEENT:  Normocephalic . Face symmetric, atraumatic.  TMs normal, throat not red, symmetric Lungs:  Few rhonchi with cough.  Otherwise clear Normal respiratory effort, no intercostal retractions, no accessory muscle  use. Heart: RRR,  no murmur.  No pretibial edema bilaterally  Skin: Not pale. Not jaundice Neurologic:  alert & oriented X3.  Speech normal, gait appropriate for  age and unassisted Psych--  Cognition and judgment appear intact.  Cooperative with normal attention span and concentration.  Behavior appropriate. No anxious or depressed appearing.      Assessment & Plan:   57 year old female, w/ PMH that includes congenital deafness, DM, HTN, GERD, resents with:  Bronchitis: Sx c/w bronchitis, will treat with doxycycline, Tessalon, Robitussin (intolerant to Mucinex DM), recommend not to take decongestants. Instructions provided in the AVS, also they translator help me communicate w/ her, at the end I feel the patient understood the instructions clearly and had no questions.

## 2017-10-30 ENCOUNTER — Other Ambulatory Visit: Payer: Self-pay | Admitting: Family

## 2017-11-05 ENCOUNTER — Other Ambulatory Visit: Payer: Self-pay | Admitting: Family

## 2017-11-17 ENCOUNTER — Ambulatory Visit (INDEPENDENT_AMBULATORY_CARE_PROVIDER_SITE_OTHER): Payer: Medicare Other | Admitting: Medical

## 2017-11-17 ENCOUNTER — Other Ambulatory Visit (INDEPENDENT_AMBULATORY_CARE_PROVIDER_SITE_OTHER): Payer: Medicare Other

## 2017-11-17 ENCOUNTER — Encounter: Payer: Self-pay | Admitting: Medical

## 2017-11-17 ENCOUNTER — Ambulatory Visit (HOSPITAL_BASED_OUTPATIENT_CLINIC_OR_DEPARTMENT_OTHER)
Admission: RE | Admit: 2017-11-17 | Discharge: 2017-11-17 | Disposition: A | Discharge status: Home / Self Care | Payer: Medicare Other | Source: Ambulatory Visit | Attending: Medical | Admitting: Medical

## 2017-11-17 VITALS — BP 126/77 | HR 92 | Temp 97.6°F | Ht 63.0 in | Wt 123.6 lb

## 2017-11-17 DIAGNOSIS — M792 Neuralgia and neuritis, unspecified: Secondary | ICD-10-CM

## 2017-11-17 DIAGNOSIS — R1011 Right upper quadrant pain: Secondary | ICD-10-CM

## 2017-11-17 DIAGNOSIS — B029 Zoster without complications: Secondary | ICD-10-CM

## 2017-11-17 DIAGNOSIS — R0781 Pleurodynia: Secondary | ICD-10-CM

## 2017-11-17 LAB — COMPREHENSIVE METABOLIC PANEL
ALT: 10 U/L (ref 0–35)
AST: 12 U/L (ref 0–37)
Albumin: 4.1 g/dL (ref 3.5–5.2)
Alkaline Phosphatase: 56 U/L (ref 39–117)
BILIRUBIN TOTAL: 0.4 mg/dL (ref 0.2–1.2)
BUN: 11 mg/dL (ref 6–23)
CO2: 31 mEq/L (ref 19–32)
CREATININE: 0.78 mg/dL (ref 0.40–1.20)
Calcium: 9.4 mg/dL (ref 8.4–10.5)
Chloride: 96 mEq/L (ref 96–112)
GFR: 81.61 mL/min (ref >60.00)
Glucose, Bld: 172 mg/dL — ABNORMAL HIGH (ref 70–99)
POTASSIUM: 3.8 meq/L (ref 3.5–5.1)
Sodium: 134 mEq/L — ABNORMAL LOW (ref 135–145)
TOTAL PROTEIN: 7.3 g/dL (ref 6.0–8.3)

## 2017-11-17 LAB — POC URINALSYSI DIPSTICK (AUTOMATED)
Bilirubin, UA: NEGATIVE
Glucose, UA: NEGATIVE
Ketones, UA: NEGATIVE
Leukocytes, UA: NEGATIVE
NITRITE UA: NEGATIVE
PROTEIN UA: NEGATIVE
RBC UA: NEGATIVE
SPEC GRAV UA: 1.01 (ref 1.010–1.025)
UROBILINOGEN UA: 0.2 U/dL
pH, UA: 6 (ref 5.0–8.0)

## 2017-11-17 LAB — CBC WITH DIFFERENTIAL/PLATELET
BASOS PCT: 0.4 % (ref 0.0–3.0)
Basophils Absolute: 0 10*3/uL (ref 0.0–0.1)
EOS PCT: 5 % (ref 0.0–5.0)
Eosinophils Absolute: 0.3 10*3/uL (ref 0.0–0.7)
HCT: 37.3 % (ref 36.0–46.0)
Hemoglobin: 12.2 g/dL (ref 12.0–15.0)
LYMPHS ABS: 2.2 10*3/uL (ref 0.7–4.0)
Lymphocytes Relative: 34.2 % (ref 12.0–46.0)
MCHC: 32.8 g/dL (ref 30.0–36.0)
MCV: 88 fl (ref 78.0–100.0)
MONOS PCT: 6.1 % (ref 3.0–12.0)
Monocytes Absolute: 0.4 10*3/uL (ref 0.1–1.0)
NEUTROS ABS: 3.5 10*3/uL (ref 1.4–7.7)
NEUTROS PCT: 54.3 % (ref 43.0–77.0)
Platelets: 266 10*3/uL (ref 150.0–400.0)
RBC: 4.24 Mil/uL (ref 3.87–5.11)
RDW: 14.9 % (ref 11.5–15.5)
WBC: 6.5 10*3/uL (ref 4.0–10.5)

## 2017-11-17 LAB — LIPASE: LIPASE: 14 U/L (ref 11.0–59.0)

## 2017-11-17 LAB — AMYLASE: AMYLASE: 47 U/L (ref 27–131)

## 2017-11-17 MED ORDER — FAMCICLOVIR 500 MG PO TABS: 500.0000 mg | ORAL_TABLET | Freq: Three times a day (TID) | ORAL | 0 refills | Status: DC

## 2017-11-17 NOTE — Patient Instructions (Addendum)
Your recent pain in right upper quadrant region seems most consistent with nerve type pain.  Severe sensitivity on light touch often consistent with shingles diagnosis.  Pain can present very early before classic skin eruption.  Also you report some pain that occasionally shoots towards the back.  There are other potential differential diagnoses so I do want to get a chest x-ray with right rib series.  Also want to get CBC, CMP, amylase, lipase and UA.  You had a history of gallbladder removal.  Your present symptoms are less worrisome for intra-abdomen type process.  But will proceed with caution and get labs.  Considered retained common bile duct stone but I think that would be very unlikely.  Please update us if your symptoms change or worsen.  I am prescribing Famvir antiviral today.  Recommend that you take 2 gabapentin at night.  Not to take during the day since you report excessive sedation.  If pain is worsening might consider prescription called tramadol.  Please update me/my chart me by Friday morning on how you are doing.  Can use tylenol every 8 hours as well for pain.  Follow-up may be needed and next week depending on how you are feeling this coming Friday.

## 2017-11-17 NOTE — Progress Notes (Signed)
Subjective:    Patient ID: Molly May, female    DOB: [birth year], 57 y.o.   MRN: [id number]  HPI  Pt in with some rt sided upper quadrant pain. Pain worse with sneezing and with movement. Pain caused by very light touch. Pt entertained thought kidney stone. Remote hx of kidney stone. NO current back pain. Hx of gallbladder removed 10 years ago. No recent fall or trauma.  2 weeks ago she did get bronchitis. She reports lingering residual rare cough. No fever, no chills or sweats.  No urinary symptoms. No pain after she eats.   Pt had pain since Tuesday.  She most prominent feature she reports is pain even on light palpation and occasional pain that shoots toward her back(describes dermatomal pattern through her interpreter)   Review of Systems  Constitutional: Negative for chills, fatigue and fever.  HENT: Negative for congestion and ear pain.   Respiratory: Positive for cough. Negative for chest tightness and shortness of breath.        Faint occasional /rare cough.  Cardiovascular: Negative for chest pain and palpitations.  Gastrointestinal: Positive for abdominal pain.       Ruq region.  Musculoskeletal: Negative for back pain.       Rib pain  Skin: Negative for rash.  Neurological: Negative for dizziness, light-headedness, numbness and headaches.       Nerve type pain on palpation. On light palpation.  Hematological: Negative for adenopathy. Does not bruise/bleed easily.  Psychiatric/Behavioral: Negative for behavioral problems and confusion.   Past Medical History:  Diagnosis Date  . Allergy   . Anxiety   . Anxiety disorder 02/19/2011  . 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 Depression   . Diabetes mellitus   . GERD (gastroesophageal reflux disease)   . Heart murmur   . Hyperlipidemia   . Hypertension   . Kidney stone   . Migraine   . Mitral valve prolapse 02/19/2011  . Sleep apnea 02/19/2011  . Stomach ulcer 24yrs of age     Social History    Socioeconomic History  . Marital status: Single    Spouse name: Not on file  . Number of children: Not on file  . Years of education: Not on file  . Highest education level: Not on file  Occupational History  . Not on file  Social Needs  . Financial resource strain: Not on file  . Food insecurity:    Worry: Not on file    Inability: Not on file  . Transportation needs:    Medical: Not on file    Non-medical: Not on file  Tobacco Use  . Smoking status: Never Smoker  . Smokeless tobacco: Never Used  Substance and Sexual Activity  . Alcohol use: No  . Drug use: No  . Sexual activity: Not on file  Lifestyle  . Physical activity:    Days per week: Not on file    Minutes per session: Not on file  . Stress: Not on file  Relationships  . Social connections:    Talks on phone: Not on file    Gets together: Not on file    Attends religious service: Not on file    Active member of club or organization: Not on file    Attends meetings of clubs or organizations: Not on file    Relationship status: Not on file  . Intimate partner violence:    Fear of current or ex partner: Not on file    Emotionally abused:  Not on file    Physically abused: Not on file    Forced sexual activity: Not on file  Other Topics Concern  . Not on file  Social History Narrative   Regular exercise:  Yes. 3-5 x weekly   Caffeine Use:  No   Married- lives with husband, 2 children (age 517 and 488).   Disabled- due to deafness and overall work. Couldn't keep up.  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          Past Surgical History:  Procedure Laterality Date  . ABDOMINAL HYSTERECTOMY  2000  . APPENDECTOMY  1986  . BREAST SURGERY  2011   biposy--benign  . CATARACT EXTRACTION Right 10/13/11  . CATARACT EXTRACTION Left 03/2014  . CHOLECYSTECTOMY  2011  . OVARIAN CYST REMOVAL  1986, 1988  . TONSILLECTOMY AND ADENOIDECTOMY   1973  . UPPER GASTROINTESTINAL ENDOSCOPY  2009   Gets them every 3 years    Family History  Problem Relation Age of Onset  . Kidney disease Mother   . Multiple sclerosis Mother   . Other Mother        polio  . Colon cancer Father   . Hyperlipidemia Father   . Heart disease Father   . Hypertension Father   . Diabetes Maternal Aunt   . Heart disease Maternal Grandmother   . Hypertension Maternal Grandmother   . Diabetes Maternal Grandmother   . Heart disease Maternal Grandfather   . Hypertension Maternal Grandfather     Allergies  Allergen Reactions  . Aspirin Other (See Comments)    Stomach pains  . Codeine Other (See Comments)    Nosebleed  . Eggs Or Egg-Derived Products     Pt reports allergy to RAW EGGS. ? Stomach upset, diarrhea  . Erythromycin Nausea And Vomiting  . Fluticasone Propionate     Stomach upset  . Penicillins Hives  . Prilosec [Omeprazole Magnesium]     migraine  . Vaseline [Petrolatum] Itching  . Vicodin [Hydrocodone-Acetaminophen]     twitching  . Zetia [Ezetimibe] Diarrhea    Current Outpatient Medications on File Prior to Visit  Medication Sig Dispense Refill  . BD PEN NEEDLE NANO U/F 32G X 4 MM MISC USE AS DIRECTED 100 each 4  . estradiol (ESTRACE) 1 MG tablet TAKE 1 TABLET BY MOUTH ONCE DAILY 90 tablet 1  . gabapentin (NEURONTIN) 100 MG capsule TAKE 1 CAPSULE BY MOUTH THREE TIMES DAILY 270 capsule 0  . glucose blood (ONE TOUCH ULTRA TEST) test strip Use to check blood sugar every morning. 100 each 2  . Insulin Glargine (LANTUS SOLOSTAR) 100 UNIT/ML Solostar Pen Inject 5 Units into the skin daily at 10 pm. 1 pen 5  . Iron Combinations (IRON COMPLEX PO) Take 75 mg by mouth daily.    Marland Kitchen. lisinopril (PRINIVIL,ZESTRIL) 2.5 MG tablet TAKE 1 TABLET BY MOUTH ONCE DAILY 90 tablet 1  . LIVALO 4 MG TABS TAKE 1 TABLET BY MOUTH ONCE DAILY 90 tablet 1  . oxybutynin (DITROPAN-XL) 5 MG 24 hr tablet TAKE 1 TABLET BY MOUTH ONCE DAILY AT BEDTIME 90 tablet 1  .  pioglitazone (ACTOS) 30 MG tablet TAKE 1 TABLET BY MOUTH ONCE DAILY 90 tablet 1  . SitaGLIPtin-MetFORMIN HCl (JANUMET XR) 50-500 MG TB24 Take 2 tablets by mouth daily. 180 tablet 1   No current facility-administered medications on file prior to visit.  BP 126/77 (BP Location: Right Arm, Patient Position: Sitting, Cuff Size: Normal)   Pulse 92   Temp 97.6 F (36.4 C) (Oral)   Ht 5\' 3"  (1.6 m)   Wt 123 lb 9.6 oz (56.1 kg)   LMP 04/28/1998   SpO2 100%   BMI 21.89 kg/m       Objective:   Physical Exam  General Mental Status- Alert. General Appearance- Not in acute distress.   Skin No redness, no rash, but rt upper quadrant very senstivite to even light touch(occasoinal pain that shoot to back)  Anterior thorax- moderate rt lower rib pain on palpation(adjacent dermatome to rt upper quadrant)  Neck Carotid Arteries- Normal color. Moisture- Normal Moisture. No carotid bruits. No JVD.  Chest and Lung Exam Auscultation: Breath Sounds:-Normal.  Cardiovascular Auscultation:Rythm- Regular. Murmurs & Other Heart Sounds:Auscultation of the heart reveals- No Murmurs.  Abdomen Inspection:-Inspeection Normal. Palpation/Percussion:Note:No mass. Palpation and Percussion of the abdomen reveal- very sensitiive skin Tender, Non Distended + BS, no rebound or guarding.    Neurologic Cranial Nerve exam:- CN III-XII intact(No nystagmus), symmetric smile. Strength:- 5/5 equal and symmetric strength both upper and lower extremities.  Back- no cva pain.     Assessment & Plan:  Your recent pain in right upper quadrant region seems most consistent with nerve type pain.  Severe sensitivity on light touch often consistent with shingles diagnosis.  Pain can present very early before classic skin eruption.  Also you report some pain that occasionally shoots towards the back.  There are other potential differential diagnoses so I do want to get a chest x-ray with right rib series.  Also want  to get CBC, CMP, amylase, lipase and UA.  You had a history of gallbladder removal.  Your present symptoms are less worrisome for intra-abdomen type process.  But will proceed with caution and get labs.  Considered retained common bile duct stone but I think that would be very unlikely.  Please update [place] if your symptoms change or worsen.  I am prescribing Famvir antiviral today.  Recommend that you take 2 gabapentin at night.  Not to take during the day since you report excessive sedation.  If pain is worsening might consider prescription called tramadol.  Please update me/my chart me by Friday morning on how you are doing.  Can use tylenol every 8 hours as well for pain.  Follow-up may be needed and next week depending on how you are feeling this coming Friday.  Esperanza Richters, PA-C

## 2017-11-19 ENCOUNTER — Ambulatory Visit (INDEPENDENT_AMBULATORY_CARE_PROVIDER_SITE_OTHER): Payer: Medicare Other | Admitting: Medical

## 2017-11-19 ENCOUNTER — Other Ambulatory Visit: Payer: Self-pay | Admitting: Medical

## 2017-11-19 ENCOUNTER — Ambulatory Visit (HOSPITAL_BASED_OUTPATIENT_CLINIC_OR_DEPARTMENT_OTHER)
Admission: RE | Admit: 2017-11-19 | Discharge: 2017-11-19 | Disposition: A | Discharge status: Home / Self Care | Payer: Medicare Other | Source: Ambulatory Visit | Attending: Medical | Admitting: Medical

## 2017-11-19 ENCOUNTER — Encounter: Payer: Self-pay | Admitting: Medical

## 2017-11-19 VITALS — BP 120/65 | HR 92 | Temp 98.4°F | Resp 16 | Ht 63.0 in | Wt 123.6 lb

## 2017-11-19 DIAGNOSIS — Z9049 Acquired absence of other specified parts of digestive tract: Secondary | ICD-10-CM | POA: Insufficient documentation

## 2017-11-19 DIAGNOSIS — R109 Unspecified abdominal pain: Secondary | ICD-10-CM | POA: Insufficient documentation

## 2017-11-19 DIAGNOSIS — I700 Atherosclerosis of aorta: Secondary | ICD-10-CM | POA: Diagnosis not present

## 2017-11-19 DIAGNOSIS — R1011 Right upper quadrant pain: Secondary | ICD-10-CM

## 2017-11-19 MED ORDER — TRAMADOL HCL 50 MG PO TABS: ORAL_TABLET | ORAL | 0 refills | Status: DC

## 2017-11-19 NOTE — Patient Instructions (Signed)
Your moderate to severe right upper quadrant region pain with occasional back pain still persist.  Work-up 2 days ago did not reveal any obvious cause.  Consider possible diagnosis of early shingles but since no improvement with medications and no rash eruption as of yet, I do want to go ahead and see if we can get CT of abdomen done today.  I would asked that you continue the Famvir and I am going to write a tramadol for the pain.  Since you know that tramadol has sedated you in the past would advise stopping gabapentin.  Currently we are trying to get insurance authorization for the study.  If study is authorized then will contact you by my chart and give instructions for over the weekend.   Follow-up date to be determined after review of imaging study.

## 2017-11-19 NOTE — Progress Notes (Signed)
Subjective:    Patient ID: Molly May, female    DOB: 01-18-1961, 57 y.o.   MRN: [id number]  HPI  Patient is in today reporting she still has persistent and moderate-severe  in severe right upper quadrant region pain.  Patient had a history of cholecystectomy in the past.  On the labs earlier today did not show any acute abnormality.  Tests that were done were a CBC, CMP, amylase lipase and UA.  Some of patient's pain on last visit was pain in the right upper quadrant with occasional sharp pain that radiates to her back.  Patient has not had any fevers, chills, sweats nausea or vomiting.  Pain is still worse with movement.  When she changes positions going from supine to a sitting pain is worse.(at rest describes no pain)  Also still has pain on light palpation.  No rash has occurred over the abdomen.  Based on patient's presentation I was considering shingles/early neuropathic pain before rash or eruption.  Patient sent me a message by my chart questioning if I could order CT abdomen.  Pt still having some occasional pain/ pressure sensation in rt cva area with the above. No blood was seen in urine yesterday.   Review of Systems  Constitutional: Negative for diaphoresis, fatigue and fever.  Respiratory: Negative for cough, chest tightness, shortness of breath and wheezing.   Cardiovascular: Negative for chest pain and palpitations.  Gastrointestinal: Positive for abdominal pain.  Genitourinary: Negative for difficulty urinating, dyspareunia, dysuria and hematuria.  Musculoskeletal: Negative for back pain.  Skin: Negative for rash.  Neurological: Negative for dizziness, speech difficulty, weakness and headaches.  Hematological: Negative for adenopathy. Does not bruise/bleed easily.  Psychiatric/Behavioral: Negative for confusion.   Past Medical History:  Diagnosis Date  . Allergy   . Anxiety   . Anxiety disorder 02/19/2011  . 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 Depression   .  Diabetes mellitus   . GERD (gastroesophageal reflux disease)   . Heart murmur   . Hyperlipidemia   . Hypertension   . Kidney stone   . Migraine   . Mitral valve prolapse 02/19/2011  . Sleep apnea 02/19/2011  . Stomach ulcer 61yrs of age     Social History   Socioeconomic History  . Marital status: Single    Spouse name: Not on file  . Number of children: Not on file  . Years of education: Not on file  . Highest education level: Not on file  Occupational History  . Not on file  Social Needs  . Financial resource strain: Not on file  . Food insecurity:    Worry: Not on file    Inability: Not on file  . Transportation needs:    Medical: Not on file    Non-medical: Not on file  Tobacco Use  . Smoking status: Never Smoker  . Smokeless tobacco: Never Used  Substance and Sexual Activity  . Alcohol use: No  . Drug use: No  . Sexual activity: Not on file  Lifestyle  . Physical activity:    Days per week: Not on file    Minutes per session: Not on file  . Stress: Not on file  Relationships  . Social connections:    Talks on phone: Not on file    Gets together: Not on file    Attends religious service: Not on file    Active member of club or organization: Not on file    Attends meetings of clubs or organizations:  Not on file    Relationship status: Not on file  . Intimate partner violence:    Fear of current or ex partner: Not on file    Emotionally abused: Not on file    Physically abused: Not on file    Forced sexual activity: Not on file  Other Topics Concern  . Not on file  Social History Narrative   Regular exercise:  Yes. 3-5 x weekly   Caffeine Use:  No   Married- lives with husband, 2 children (age 207 and 488).   Disabled- due to deafness and overall work. Couldn't keep up.  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          Past Surgical History:  Procedure Laterality Date  .  ABDOMINAL HYSTERECTOMY  2000  . APPENDECTOMY  1986  . BREAST SURGERY  2011   biposy--benign  . CATARACT EXTRACTION Right 10/13/11  . CATARACT EXTRACTION Left 03/2014  . CHOLECYSTECTOMY  2011  . OVARIAN CYST REMOVAL  1986, 1988  . TONSILLECTOMY AND ADENOIDECTOMY  1973  . UPPER GASTROINTESTINAL ENDOSCOPY  2009   Gets them every 3 years    Family History  Problem Relation Age of Onset  . Kidney disease Mother   . Multiple sclerosis Mother   . Other Mother        polio  . Colon cancer Father   . Hyperlipidemia Father   . Heart disease Father   . Hypertension Father   . Diabetes Maternal Aunt   . Heart disease Maternal Grandmother   . Hypertension Maternal Grandmother   . Diabetes Maternal Grandmother   . Heart disease Maternal Grandfather   . Hypertension Maternal Grandfather     Allergies  Allergen Reactions  . Aspirin Other (See Comments)    Stomach pains  . Codeine Other (See Comments)    Nosebleed  . Eggs Or Egg-Derived Products     Pt reports allergy to RAW EGGS. ? Stomach upset, diarrhea  . Erythromycin Nausea And Vomiting  . Fluticasone Propionate     Stomach upset  . Penicillins Hives  . Prilosec [Omeprazole Magnesium]     migraine  . Vaseline [Petrolatum] Itching  . Vicodin [Hydrocodone-Acetaminophen]     twitching  . Zetia [Ezetimibe] Diarrhea    Current Outpatient Medications on File Prior to Visit  Medication Sig Dispense Refill  . BD PEN NEEDLE NANO U/F 32G X 4 MM MISC USE AS DIRECTED 100 each 4  . estradiol (ESTRACE) 1 MG tablet TAKE 1 TABLET BY MOUTH ONCE DAILY 90 tablet 1  . famciclovir (FAMVIR) 500 MG tablet Take 1 tablet (500 mg total) by mouth 3 (three) times daily. 21 tablet 0  . glucose blood (ONE TOUCH ULTRA TEST) test strip Use to check blood sugar every morning. 100 each 2  . Insulin Glargine (LANTUS SOLOSTAR) 100 UNIT/ML Solostar Pen Inject 5 Units into the skin daily at 10 pm. 1 pen 5  . Iron Combinations (IRON COMPLEX PO) Take 75 mg by  mouth daily.    Marland Kitchen. lisinopril (PRINIVIL,ZESTRIL) 2.5 MG tablet TAKE 1 TABLET BY MOUTH ONCE DAILY 90 tablet 1  . LIVALO 4 MG TABS TAKE 1 TABLET BY MOUTH ONCE DAILY 90 tablet 1  . oxybutynin (DITROPAN-XL) 5 MG 24 hr tablet TAKE 1 TABLET BY MOUTH ONCE DAILY AT BEDTIME 90 tablet 1  . pioglitazone (ACTOS) 30 MG tablet TAKE 1 TABLET BY MOUTH  ONCE DAILY 90 tablet 1  . SitaGLIPtin-MetFORMIN HCl (JANUMET XR) 50-500 MG TB24 Take 2 tablets by mouth daily. 180 tablet 1   No current facility-administered medications on file prior to visit.     BP 120/65   Pulse 92   Temp 98.4 F (36.9 C) (Oral)   Resp 16   Ht 5\' 3"  (1.6 m)   Wt 123 lb 9.6 oz (56.1 kg)   LMP 04/28/1998   SpO2 98%   BMI 21.89 kg/m       Objective:   Physical Exam   General Mental Status- Alert. General Appearance- Not in acute distress.   Skin No redness, no rash, but rt upper quadrant very senstivite to even light touch(occasoinal pain that shoot to back)  Anterior thorax- moderate rt lower rib pain on palpation(adjacent dermatome to rt upper quadrant)  Neck Carotid Arteries- Normal color. Moisture- Normal Moisture. No carotid bruits. No JVD.  Chest and Lung Exam Auscultation: Breath Sounds:-Normal.  Cardiovascular Auscultation:Rythm- Regular. Murmurs & Other Heart Sounds:Auscultation of the heart reveals- No Murmurs.  Abdomen Inspection:-Inspeection Normal. Palpation/Percussion:Note:No mass. Palpation and Percussion of the abdomen reveal- very sensitiive skin Tender but severe pain on palpation, Non Distended + BS, no rebound or guarding.  Pain is worse on changing positions/going from supine to sitting.    Neurologic Cranial Nerve exam:- CN III-XII intact(No nystagmus), symmetric smile. Strength:- 5/5 equal and symmetric strength both upper and lower extremities.  Back- no cva pain presently.      Assessment & Plan:  Your moderate to severe right upper quadrant region pain with occasional  back pain still persist.  Work-up 2 days ago did not reveal any obvious cause.  Consider possible diagnosis of early shingles but since no improvement with medications and no rash eruption as of yet, I do want to go ahead and see if we can get CT of abdomen done today.  I would asked that you continue the Famvir and I am going to write a tramadol for the pain.  Since you know that tramadol has sedated you in the past would advise stopping gabapentin.  Currently we are trying to get insurance authorization for the study.  If study is authorized then will contact you by my chart and give instructions for over the weekend.   Follow-up date to be determined after review of imaging study.  I discussed with radiology tech patient's presentation and in the end  ok'd to  get CT of abdomen with oral contrast. Later reviewed report and done without. Will notify patient of findings. Will explain study to her that in  the event pain worsens or change advise ED evaluation as contrast study may be needed. If pain location change may need pelvis imaging.(not no gallbadder, no appendix and no blood in urine)   Esperanza Richters, PA-C

## 2017-12-09 ENCOUNTER — Ambulatory Visit: Payer: Self-pay | Admitting: Family

## 2017-12-11 ENCOUNTER — Encounter: Payer: Self-pay | Admitting: Family

## 2017-12-11 ENCOUNTER — Ambulatory Visit (INDEPENDENT_AMBULATORY_CARE_PROVIDER_SITE_OTHER): Payer: Medicare Other | Admitting: Family

## 2017-12-11 VITALS — BP 120/72 | HR 93 | Temp 97.9°F | Resp 18 | Ht 63.0 in | Wt 123.0 lb

## 2017-12-11 DIAGNOSIS — M546 Pain in thoracic spine: Secondary | ICD-10-CM

## 2017-12-11 MED ORDER — MELOXICAM 7.5 MG PO TABS: 7.5000 mg | ORAL_TABLET | Freq: Every day | ORAL | 1 refills | Status: DC

## 2017-12-11 NOTE — Progress Notes (Signed)
Subjective:    Patient ID: Molly May, female    DOB: Sep 24, 1960, 57 y.o.   MRN: [id number]  HPI  Patient is a 57 yr old female who presents today for follow up of her right sided flank pain. Reports that she continues to have right sided flank pain. Pain is worse with a deep breath and wraps around the right lower rib cage. She saw Kristine Garbe PA-C for this pain on 11/19/17.  Work up included CBC, CMP, Amylase, lipase and UA and negative CT abdomen without contrast. Neg rib xray/cxr. She describes pain as a throbbing pain.  Sign language interpreter is present for today's visit.   Review of Systems    see HPI  Past Medical History:  Diagnosis Date  . Allergy   . Anxiety   . Anxiety disorder 02/19/2011  . 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 Depression   . Diabetes mellitus   . GERD (gastroesophageal reflux disease)   . Heart murmur   . Hyperlipidemia   . Hypertension   . Kidney stone   . Migraine   . Mitral valve prolapse 02/19/2011  . Sleep apnea 02/19/2011  . Stomach ulcer 39yrs of age     Social History   Socioeconomic History  . Marital status: Single    Spouse name: Not on file  . Number of children: Not on file  . Years of education: Not on file  . Highest education level: Not on file  Occupational History  . Not on file  Social Needs  . Financial resource strain: Not on file  . Food insecurity:    Worry: Not on file    Inability: Not on file  . Transportation needs:    Medical: Not on file    Non-medical: Not on file  Tobacco Use  . Smoking status: Never Smoker  . Smokeless tobacco: Never Used  Substance and Sexual Activity  . Alcohol use: No  . Drug use: No  . Sexual activity: Not on file  Lifestyle  . Physical activity:    Days per week: Not on file    Minutes per session: Not on file  . Stress: Not on file  Relationships  . Social connections:    Talks on phone: Not on file    Gets together: Not on file    Attends religious service:  Not on file    Active member of club or organization: Not on file    Attends meetings of clubs or organizations: Not on file    Relationship status: Not on file  . Intimate partner violence:    Fear of current or ex partner: Not on file    Emotionally abused: Not on file    Physically abused: Not on file    Forced sexual activity: Not on file  Other Topics Concern  . Not on file  Social History Narrative   Regular exercise:  Yes. 3-5 x weekly   Caffeine Use:  No   Married- lives with husband, 2 children (age 57 and 13).   Disabled- due to deafness and overall work. Couldn't keep up.  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          Past Surgical History:  Procedure Laterality Date  . ABDOMINAL HYSTERECTOMY  2000  . APPENDECTOMY  1986  . BREAST SURGERY  2011   biposy--benign  . CATARACT EXTRACTION Right 10/13/11  . CATARACT  EXTRACTION Left 03/2014  . CHOLECYSTECTOMY  2011  . OVARIAN CYST REMOVAL  1986, 1988  . TONSILLECTOMY AND ADENOIDECTOMY  1973  . UPPER GASTROINTESTINAL ENDOSCOPY  2009   Gets them every 3 years    Family History  Problem Relation Age of Onset  . Kidney disease Mother   . Multiple sclerosis Mother   . Other Mother        polio  . Colon cancer Father   . Hyperlipidemia Father   . Heart disease Father   . Hypertension Father   . Diabetes Maternal Aunt   . Heart disease Maternal Grandmother   . Hypertension Maternal Grandmother   . Diabetes Maternal Grandmother   . Heart disease Maternal Grandfather   . Hypertension Maternal Grandfather     Allergies  Allergen Reactions  . Aspirin Other (See Comments)    Stomach pains  . Codeine Other (See Comments)    Nosebleed  . Eggs Or Egg-Derived Products     Pt reports allergy to RAW EGGS. ? Stomach upset, diarrhea  . Erythromycin Nausea And Vomiting  . Fluticasone Propionate     Stomach upset  . Penicillins Hives  . Prilosec  [Omeprazole Magnesium]     migraine  . Vaseline [Petrolatum] Itching  . Vicodin [Hydrocodone-Acetaminophen]     twitching  . Zetia [Ezetimibe] Diarrhea    Current Outpatient Medications on File Prior to Visit  Medication Sig Dispense Refill  . BD PEN NEEDLE NANO U/F 32G X 4 MM MISC USE AS DIRECTED 100 each 4  . estradiol (ESTRACE) 1 MG tablet TAKE 1 TABLET BY MOUTH ONCE DAILY 90 tablet 1  . glucose blood (ONE TOUCH ULTRA TEST) test strip Use to check blood sugar every morning. 100 each 2  . Insulin Glargine (LANTUS SOLOSTAR) 100 UNIT/ML Solostar Pen Inject 5 Units into the skin daily at 10 pm. 1 pen 5  . Iron Combinations (IRON COMPLEX PO) Take 75 mg by mouth daily.    Marland Kitchen. lisinopril (PRINIVIL,ZESTRIL) 2.5 MG tablet TAKE 1 TABLET BY MOUTH ONCE DAILY 90 tablet 1  . LIVALO 4 MG TABS TAKE 1 TABLET BY MOUTH ONCE DAILY 90 tablet 1  . oxybutynin (DITROPAN-XL) 5 MG 24 hr tablet TAKE 1 TABLET BY MOUTH ONCE DAILY AT BEDTIME 90 tablet 1  . pioglitazone (ACTOS) 30 MG tablet TAKE 1 TABLET BY MOUTH ONCE DAILY 90 tablet 1  . SitaGLIPtin-MetFORMIN HCl (JANUMET XR) 50-500 MG TB24 Take 2 tablets by mouth daily. 180 tablet 1   No current facility-administered medications on file prior to visit.     BP 120/72 (BP Location: Left Arm, Cuff Size: Normal)   Pulse 93   Temp 97.9 F (36.6 C) (Oral)   Resp 18   Ht 5\' 3"  (1.6 m)   Wt 123 lb (55.8 kg)   LMP 04/28/1998   SpO2 99%   BMI 21.79 kg/m    Objective:   Physical Exam  Constitutional: She appears well-developed and well-nourished.  Cardiovascular: Normal rate, regular rhythm and normal heart sounds.  No murmur heard. Pulmonary/Chest: Effort normal and breath sounds normal. No respiratory distress. She has no wheezes.  Musculoskeletal: She exhibits no tenderness.  Psychiatric: She has a normal mood and affect. Her behavior is normal. Judgment and thought content normal.          Assessment & Plan:  Thoracic pain- uncontrolled. 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  Trial of meloxicam and referral to PT. If symptoms worsen or if  they fail to improve consider further imaging of thoracic spine.

## 2017-12-11 NOTE — Patient Instructions (Signed)
Please begin meloxicam (anti-inflammatory) once daily.  You should be contacted about scheduling physical therapy.

## 2017-12-16 ENCOUNTER — Ambulatory Visit: Payer: Medicare Other | Admitting: Physician Assistant

## 2017-12-16 ENCOUNTER — Encounter: Payer: Self-pay | Admitting: Physician Assistant

## 2017-12-16 VITALS — BP 120/68 | HR 88 | Ht 63.0 in | Wt 125.6 lb

## 2017-12-16 DIAGNOSIS — R142 Eructation: Secondary | ICD-10-CM

## 2017-12-16 DIAGNOSIS — R6881 Early satiety: Secondary | ICD-10-CM | POA: Diagnosis not present

## 2017-12-16 DIAGNOSIS — K219 Gastro-esophageal reflux disease without esophagitis: Secondary | ICD-10-CM

## 2017-12-16 MED ORDER — PANTOPRAZOLE SODIUM 40 MG PO TBEC: DELAYED_RELEASE_TABLET | ORAL | 3 refills | Status: DC

## 2017-12-16 NOTE — Patient Instructions (Addendum)
We have sent the following medications to your pharmacy for you to pick up at your convenience[facility] [address]. 1. Pantoprazole Sodium 40 mg.  You have been scheduled for a gastric empt[facility] on Monday 12-21-2017  at 11:00 am. Please arrive at 10:45 am to your appointment for registration. Please make certain not to have anything to eat or drink 6 hours before your test. Hold all stomach medications (ex: Zofran, phenergan, Reglan, Pantoprazole sodium )  48 hours prior to your test. If you need to reschedule your appointment, please contact radiology scheduling at [phone]. _____________________________________________________________________ A gastric-emptying study measures how long it takes for food to move through your stomach. There are several ways to measure stomach emptying. In the most common test, you eat food that contains a small amount of radioactive material. A scanner that detects the movement of the radioactive material is placed over your abdomen to monitor the rate at which food leaves your stomach. This test normally takes about 4 hours to complete. _____________________________________________________________________   If you are age 55 or younger, your body mass index should be between 19-25. Your Body mass index is 22.25 kg/m. If this is out of the aformentioned range listed, please consider follow up with your Primary Care Provider.

## 2017-12-16 NOTE — Progress Notes (Addendum)
Subjective:    Patient ID: Molly May, female    DOB: 12/17/1960, 57 y.o.   MRN: [id number]  HPI Molly May is a pleasant 57 year old white female, known to Dr. Rhea Belton who was last seen in our office in 2016 went work-up for anemia/iron deficiency She had EGD which was normal, biopsies negative for H. pylori, negative colonoscopy, and then negative capsule endoscopy with one tiny nonspecific ulcer in the ileum. Patient has history of long-standing adult onset diabetes mellitus, she has been on insulin over the past year, history of IBS, GERD, peptic ulcer disease and is status post cholecystectomy. She comes in today with complaints of symptoms over the past month primarily of persistent heartburn and indigestion during the day, and heartburn indigestion burping and belching during the nighttime hours.  She denies any dysphagia or odynophagia. She says she feels as if she is digesting poorly and that she is feeling up very quickly.  She has found herself eating smaller amounts of food, and appetite has decreased due to the fullness.  Has not had any significant weight loss.  Bowel movements have been normal.  No nausea or vomiting.  She says she feels full and uncomfortable after eating just small amounts.   She is on meloxicam but was just prescribed that about a week ago, prior to that no NSAID use. She had tried Prilosec in the past which caused migraines, as did Nexium.  She thinks she can tolerate pantoprazole. She has been trying over-the-counter medications including Mylanta, Zantac ,Tums etc. without any benefit.  Review of Systems;Pertinent positive and negative review of systems were noted in the above HPI section.  All other review of systems was otherwise negative.  Outpatient Encounter Medications as of 12/16/2017  Medication Sig  . BD PEN NEEDLE NANO U/F 32G X 4 MM MISC USE AS DIRECTED  . estradiol (ESTRACE) 1 MG tablet TAKE 1 TABLET BY MOUTH ONCE DAILY  . glucose blood (ONE  TOUCH ULTRA TEST) test strip Use to check blood sugar every morning.  . Insulin Glargine (LANTUS SOLOSTAR) 100 UNIT/ML Solostar Pen Inject 5 Units into the skin daily at 10 pm.  . Iron Combinations (IRON COMPLEX PO) Take 75 mg by mouth daily.  Marland Kitchen lisinopril (PRINIVIL,ZESTRIL) 2.5 MG tablet TAKE 1 TABLET BY MOUTH ONCE DAILY  . LIVALO 4 MG TABS TAKE 1 TABLET BY MOUTH ONCE DAILY  . meloxicam (MOBIC) 7.5 MG tablet Take 1 tablet (7.5 mg total) by mouth daily.  Marland Kitchen oxybutynin (DITROPAN-XL) 5 MG 24 hr tablet TAKE 1 TABLET BY MOUTH ONCE DAILY AT BEDTIME  . pioglitazone (ACTOS) 30 MG tablet TAKE 1 TABLET BY MOUTH ONCE DAILY  . SitaGLIPtin-MetFORMIN HCl (JANUMET XR) 50-500 MG TB24 Take 2 tablets by mouth daily.  . pantoprazole (PROTONIX) 40 MG tablet Take 1 tablet by mouth every morning.   No facility-administered encounter medications on file as of 12/16/2017.    Allergies  Allergen Reactions  . Aspirin Other (See Comments)    Stomach pains  . Codeine Other (See Comments)    Nosebleed  . Eggs Or Egg-Derived Products     Pt reports allergy to RAW EGGS. ? Stomach upset, diarrhea  . Erythromycin Nausea And Vomiting  . Fluticasone Propionate     Stomach upset  . Penicillins Hives  . Prilosec [Omeprazole Magnesium]     migraine  . Vaseline [Petrolatum] Itching  . Vicodin [Hydrocodone-Acetaminophen]     twitching  . Zetia [Ezetimibe] Diarrhea   Patient Active Problem List  Diagnosis Date Noted  . Thumb injury, left, subsequent encounter 08/10/2017  . Right knee pain 08/10/2017  . Anemia, iron deficiency 04/11/2016  . Lactose intolerance 10/17/2015  . Osteoarthritis of both hands 12/27/2014  . Overactive bladder 06/19/2014  . Low back pain 05/10/2014  . Hematuria 12/23/2013  . Insomnia 05/31/2013  . Sciatica 01/28/2013  . Nocturnal leg cramps 11/03/2011  . Hyperlipidemia 05/02/2011  . 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 Irritable bowel syndrome 02/19/2011  . Mitral valve prolapse  02/19/2011  . Migraine 02/19/2011  . General medical examination 01/17/2011  . Diabetic peripheral neuropathy (HCC) 01/17/2011  . GERD (gastroesophageal reflux disease) 12/18/2010  . Diabetes type 2, controlled (HCC) 12/18/2010   Social History   Socioeconomic History  . Marital status: Single    Spouse name: Not on file  . Number of children: Not on file  . Years of education: Not on file  . Highest education level: Not on file  Occupational History  . Not on file  Social Needs  . Financial resource strain: Not on file  . Food insecurity:    Worry: Not on file    Inability: Not on file  . Transportation needs:    Medical: Not on file    Non-medical: Not on file  Tobacco Use  . Smoking status: Never Smoker  . Smokeless tobacco: Never Used  Substance and Sexual Activity  . Alcohol use: No  . Drug use: No  . Sexual activity: Not on file  Lifestyle  . Physical activity:    Days per week: Not on file    Minutes per session: Not on file  . Stress: Not on file  Relationships  . Social connections:    Talks on phone: Not on file    Gets together: Not on file    Attends religious service: Not on file    Active member of club or organization: Not on file    Attends meetings of clubs or organizations: Not on file    Relationship status: Not on file  . Intimate partner violence:    Fear of current or ex partner: Not on file    Emotionally abused: Not on file    Physically abused: Not on file    Forced sexual activity: Not on file  Other Topics Concern  . Not on file  Social History Narrative   Regular exercise:  Yes. 3-5 x weekly   Caffeine Use:  No   Married- lives with husband, 2 children (age 467 and 838).   Disabled- due to deafness and overall work. Couldn't keep up.  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          Ms. Carin Primroseurman's family history includes Diabetes in her maternal aunt and  maternal grandmother; Heart disease in her father, maternal grandfather, and maternal grandmother; Hyperlipidemia in her father; Hypertension in her father, maternal grandfather, and maternal grandmother; Kidney disease in her mother; Multiple sclerosis in her mother; Other in her mother; Stomach cancer in her father.      Objective:    Vitals:   12/16/17 1332  BP: 120/68  Pulse: 88    Physical Exam; well-developed white female in no acute distress, she has congenital deafness and is accompanied by a sign interpreter.  Blood pressure 120/68 pulse 88, height 5 foot 3, weight 125, BMI 22.2.  HEENT; nontraumatic normocephalic EOMI PERRLA sclera anicteric buccal mucosa moist, neck supple,  Cardiovascular; regular rate and rhythm with S1-S2 no murmur rub or gallop, Pulmonary; clear bilaterally, Abdomen ;soft, nontender nondistended bowel sounds are present no definite succussion splash no palpable mass or hepatosplenomegaly.  Rectal; exam not done, Extremities; no clubbing cyanosis or edema skin warm and dry, Neuro psych; alert and oriented, grossly nonfocal mood and affect appropriate ,she is deaf       Assessment & Plan:   #51 57 year old deaf white female with 1 month history of ongoing heartburn indigestion, nocturnal belching burping and heartburn as well as early satiety.  Rule out diabetic gastroparesis with secondary reflux esophagitis or poorly controlled GERD  #2 status post cholecystectomy # 3.  Adult onset diabetes mellitus, now insulin-dependent  #4 prior history of iron deficiency anemia with negative GI work-up 2016- most recent CBCs normal  Plan; Start pantoprazole 40 mg p.o. every morning AC breakfast (intolerant to omeprazole and Nexium with headaches) Start gastroparesis step 3 diet, small frequent feedings I have also reviewed antireflux regimen including n.p.o. for 3 hours prior to bedtime and elevation of the head of the bed at least 45 degrees Schedule gastric emptying  scan  I will plan to see her back in the office in 2 to 3 weeks.  Can consider repeat EGD depending on results of gastric emptying scan and response to PPI therapy.   Pryce Folts S Yoshiko Keleher PA-C 12/16/2017   Cc: Sandford Craze'Sullivan, Melissa, NP  Addendum: Reviewed and agree with initial management. Pyrtle, Carie CaddyJay M, MD

## 2017-12-21 ENCOUNTER — Encounter (HOSPITAL_COMMUNITY)
Admission: RE | Admit: 2017-12-21 | Discharge: 2017-12-21 | Disposition: A | Discharge status: Home / Self Care | Payer: Medicare Other | Source: Ambulatory Visit | Attending: Physician Assistant | Admitting: Physician Assistant

## 2017-12-21 DIAGNOSIS — R142 Eructation: Secondary | ICD-10-CM | POA: Insufficient documentation

## 2017-12-21 DIAGNOSIS — K219 Gastro-esophageal reflux disease without esophagitis: Secondary | ICD-10-CM

## 2017-12-21 DIAGNOSIS — R6881 Early satiety: Secondary | ICD-10-CM | POA: Diagnosis not present

## 2017-12-21 MED ORDER — TECHNETIUM TC 99M SULFUR COLLOID: 2.1100 | Freq: Once | INTRAVENOUS | Status: DC | PRN

## 2017-12-21 MED ORDER — TECHNETIUM TC 99M SULFUR COLLOID
2.1100 | Freq: Once | INTRAVENOUS | Status: AC | PRN
  Administered 2017-12-21: 2.11 via ORAL

## 2017-12-22 ENCOUNTER — Ambulatory Visit: Payer: Medicare Other | Admitting: Physical Therapy

## 2017-12-30 ENCOUNTER — Encounter: Payer: Self-pay | Admitting: Physical Therapy

## 2017-12-30 ENCOUNTER — Ambulatory Visit: Payer: Medicare Other | Attending: Family | Admitting: Physical Therapy

## 2017-12-30 ENCOUNTER — Other Ambulatory Visit: Payer: Self-pay

## 2017-12-30 DIAGNOSIS — R29898 Other symptoms and signs involving the musculoskeletal system: Secondary | ICD-10-CM | POA: Diagnosis not present

## 2017-12-30 DIAGNOSIS — M546 Pain in thoracic spine: Secondary | ICD-10-CM | POA: Diagnosis not present

## 2017-12-30 NOTE — Therapy (Signed)
[facility] [address]  [address] [place], [place], [address] Phone: [phone]   Fax:  [phone]  Physical Therapy Evaluation  Patient Details  Name: Molly May MRN: [id number] Date of Birth: 1961-04-03 Referring Provider: Sandford Craze, NP   Encounter Date: 12/30/2017  PT End of Session - 12/30/17 1454    Visit Number  1    Number of Visits  5    Date for PT Re-Evaluation  02/24/18    Authorization Type  [employer]HC Medicare & Medicaid    PT Start Time  1357    PT Stop Time  1446    PT Time Calculation (min)  49 min    Activity Tolerance  Patient tolerated treatment well;Patient limited by pain    Behavior During Therapy  [facility] for tasks assessed/performed       Past Medical History:  Diagnosis Date  . Allergy   . Anxiety   . Anxiety disorder 02/19/2011  . 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 Depression   . Diabetes mellitus   . GERD (gastroesophageal reflux disease)   . Heart murmur   . Hyperlipidemia   . Hypertension   . Iron deficiency   . Kidney stone   . Migraine   . Mitral valve prolapse 02/19/2011  . Sleep apnea 02/19/2011  . Stomach ulcer 47yrs of age    Past Surgical History:  Procedure Laterality Date  . ABDOMINAL HYSTERECTOMY  2000  . APPENDECTOMY  1986  . BREAST SURGERY  2011   biposy--benign  . CATARACT EXTRACTION Right 10/13/11  . CATARACT EXTRACTION Left 03/2014  . CHOLECYSTECTOMY  2011  . OVARIAN CYST REMOVAL  1986, 1988  . TONSILLECTOMY AND ADENOIDECTOMY  1973  . UPPER GASTROINTESTINAL ENDOSCOPY  2009   Gets them every 3 years    There were no vitals filed for this visit.   Subjective Assessment - 12/30/17 1359    Subjective  Patient reports she started having R sided thoracic pain radiating to anterior ribs about a month ago. Denies N/T. Reports she fell in January but does not think this caused her pain- only hurt above her eyebrow and thumb. Thinks she may have tried to  lift something that was too heavy for her. Aggravating factors: coughing, lifting, difficulty sleeping, sitting up from supine. Easing factors: resting, staying still.      Patient is accompained by:  Interpreter    Pertinent History  stomach ulcer, mitral valve prolapse, migraine, kidney stone, iron deficiency, HTN, HLD, heart murmur, GERD, DM, congenital deafness    Limitations  Lifting;House hold activities    Diagnostic tests  11/17/17 R rib xray: There is no active cardiopulmonary disease. No acute right rib abnormality is observed.    Patient Stated Goals  be able to get up on my own and not have to grab onto my dog. Be independent and move like before.     Currently in Pain?  Yes    Pain Score  8     Pain Location  Back    Pain Orientation  Right    Pain Descriptors / Indicators  Nagging;Pressure    Pain Type  Acute pain    Aggravating Factors   coughing, lifting., difficulty sleeping, sitting up from supine    Pain Relieving Factors  staying still, rest         [facility] PT Assessment - 12/30/17 1416      Assessment   Medical Diagnosis  R sided thoracic  back pain    Referring Provider  Sandford Craze, NP    Onset Date/Surgical Date  11/29/17    Next MD Visit  01/22/18    Prior Therapy  Yes      Precautions   Precautions  --   congenital deafness     Restrictions   Weight Bearing Restrictions  No      Balance Screen   Has the patient fallen in the past 6 months  No    Has the patient had a decrease in activity level because of a fear of falling?   No    Is the patient reluctant to leave their home because of a fear of falling?   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   Leisure  none      Cognition   Overall Cognitive Status   Within Functional Limits for tasks assessed      Observation/Other Assessments   Focus on Therapeutic Outcomes (FOTO)   Thoracic: 51 (49% limited, 34% predicted)      Sensation   Light Touch  Appears Intact      Coordination   Gross Motor Movements are Fluid and Coordinated  Yes      Posture/Postural Control   Posture/Postural Control  Postural limitations    Postural Limitations  Rounded Shoulders;Forward head      ROM / Strength   AROM / PROM / Strength  AROM;Strength      AROM   AROM Assessment Site  Thoracic    Thoracic Flexion  [facility]    Thoracic Extension  moderately limited   severe pain in R midback   Thoracic - Right Side Bend  [facility]    Thoracic - Left Side Bend  WFL   mild pain R midback   Thoracic - Right Rotation  [facility]    Thoracic - Left Rotation  [facility]      Strength   Strength Assessment Site  Shoulder    Right/Left Shoulder  Right;Left    Right Shoulder Flexion  4/5   pain in R mid back   Right Shoulder ABduction  4+/5    Right Shoulder Internal Rotation  4/5    Right Shoulder External Rotation  4/5    Left Shoulder Flexion  4+/5    Left Shoulder ABduction  4+/5    Left Shoulder Internal Rotation  4/5    Left Shoulder External Rotation  4/5      Palpation   Spinal mobility  T7-10 mild hypomobility and pain with central PAs; R rib 6 and 10 painful with gentle PAs    Palpation comment  --                Objective measurements completed on examination: See above findings.              PT Education - 12/30/17 1454    Education Details  prognosis, POC, HEP    Person(s) Educated  Patient    Methods  Explanation;Demonstration;Tactile cues;Verbal cues;Handout    Comprehension  Returned demonstration;Verbalized understanding       PT Short Term Goals - 12/30/17 1504  PT SHORT TERM GOAL #1   Title  Patient to be independent with initial HEP.    Time  4    Period  Weeks    Status  New    Target Date  01/27/18        PT Long Term  Goals - 12/30/17 1504      PT LONG TERM GOAL #1   Title  Patient to be independent with advanced HEP.    Time  8    Period  Weeks    Status  New    Target Date  02/24/18      PT LONG TERM GOAL #2   Title  Patient to demonstrate [facility] and pain free thoracic AROM.    Time  8    Period  Weeks    Status  New    Target Date  02/24/18      PT LONG TERM GOAL #3   Title  Patient to demonstrate symmetrical and pain-free R shoulder MMTing.     Time  8    Period  Weeks    Status  New    Target Date  02/24/18      PT LONG TERM GOAL #4   Title  Patient to demonstrate lifting 5lb weight overhead with R UE without pain limiting.     Time  8    Period  Weeks    Status  New    Target Date  02/24/18             Plan - 12/30/17 1501    Clinical Impression Statement  Patient is a 56y/o F presenting to OPPT with c/o R sided thoracic pain radiating along anterior ribs. Denies N/T. Aggravating factors include coughing, lifting, sitting up from supine, trouble sleeping. Patient today present with sign language interpreter. Presents with limited and painful thoracic AROM- worst with extension, limited and painful R UE strength, mild hypomobility and pain with central T7-10 PAs, and pain with gentle PAs of R ribs 6 and 10. Patient educated on gentle ROM and strengthening HEP and received handout. Advised not to push into pain. Reported understanding. Would benefit from skilled PT services 1x every other week for 8 weeks to address aforementioned impairments.     Clinical Presentation  Stable    Clinical Decision Making  Low    Rehab Potential  Good    PT Frequency  Other (comment)   1x every other week   PT Duration  8 weeks    PT Treatment/Interventions  ADLs/Self Care Home Management;Cryotherapy;Electrical Stimulation;Iontophoresis 4mg /ml Dexamethasone;Moist Heat;Traction;Ultrasound;Functional mobility training;Therapeutic activities;Therapeutic exercise;Manual techniques;Patient/family  education;Balance training;Passive range of motion;Dry needling;Energy conservation;Splinting;Taping;Neuromuscular re-education    PT Next Visit Plan  reassess HEP    Consulted and Agree with Plan of Care  Patient       Patient will benefit from skilled therapeutic intervention in order to improve the following deficits and impairments:  Hypomobility, Decreased activity tolerance, Decreased strength, Pain, Improper body mechanics, Postural dysfunction, Decreased range of motion  Visit Diagnosis: Pain in thoracic spine  Other symptoms and signs involving the musculoskeletal system     Problem List Patient Active Problem List   Diagnosis Date Noted  . Thumb injury, left, subsequent encounter 08/10/2017  . Right knee pain 08/10/2017  . Anemia, iron deficiency 04/11/2016  . Lactose intolerance 10/17/2015  . Osteoarthritis of both hands 12/27/2014  . Overactive bladder 06/19/2014  . Low back pain 05/10/2014  . Hematuria 12/23/2013  . Insomnia 05/31/2013  .  Sciatica 01/28/2013  . Nocturnal leg cramps 11/03/2011  . Hyperlipidemia 05/02/2011  . 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 Irritable bowel syndrome 02/19/2011  . Mitral valve prolapse 02/19/2011  . Migraine 02/19/2011  . General medical examination 01/17/2011  . Diabetic peripheral neuropathy (HCC) 01/17/2011  . GERD (gastroesophageal reflux disease) 12/18/2010  . Diabetes type 2, controlled (HCC) 12/18/2010    Anette Guarneri, PT, DPT 12/30/17 3:09 PM    [facility] [address]  [address] [place], [place], [address] Phone: [phone]   Fax:  [phone]  Name: Molly May MRN: [id number] Date of Birth: 03/21/1961

## 2018-01-13 ENCOUNTER — Encounter: Payer: Self-pay | Admitting: Physical Therapy

## 2018-01-13 ENCOUNTER — Ambulatory Visit: Payer: Medicare Other | Admitting: Physical Therapy

## 2018-01-13 DIAGNOSIS — R29898 Other symptoms and signs involving the musculoskeletal system: Secondary | ICD-10-CM | POA: Diagnosis not present

## 2018-01-13 DIAGNOSIS — M546 Pain in thoracic spine: Secondary | ICD-10-CM

## 2018-01-13 NOTE — Therapy (Signed)
[facility] [address]  [address] [place], [place], [address] Phone: [phone]   Fax:  [phone]  Physical Therapy Treatment  Patient Details  Name: Molly May May MRN: [id number] Date of Birth: [birth year] Referring Provider: Sandford Craze, NP   Encounter Date: 01/13/2018  PT End of Session - 01/13/18 1153    Visit Number  2    Number of Visits  5    Date for PT Re-Evaluation  02/24/18    Authorization Type  UHC Medicare & Medicaid    PT Start Time  1104    PT Stop Time  1200    PT Time Calculation (min)  56 min    Activity Tolerance  Patient tolerated treatment well    Behavior During Therapy  [facility] for tasks assessed/performed       Past Medical History:  Diagnosis Date  . Allergy   . Anxiety   . Anxiety disorder 02/19/2011  . 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 Depression   . Diabetes mellitus   . GERD (gastroesophageal reflux disease)   . Heart murmur   . Hyperlipidemia   . Hypertension   . Iron deficiency   . Kidney stone   . Migraine   . Mitral valve prolapse 02/19/2011  . Sleep apnea 02/19/2011  . Stomach ulcer 60yrs of age    Past Surgical History:  Procedure Laterality Date  . ABDOMINAL HYSTERECTOMY  2000  . APPENDECTOMY  1986  . BREAST SURGERY  2011   biposy--benign  . CATARACT EXTRACTION Right 10/13/11  . CATARACT EXTRACTION Left 03/2014  . CHOLECYSTECTOMY  2011  . OVARIAN CYST REMOVAL  1986, 1988  . TONSILLECTOMY AND ADENOIDECTOMY  1973  . UPPER GASTROINTESTINAL ENDOSCOPY  2009   Gets them every 3 years    There were no vitals filed for this visit.  Subjective Assessment - 01/13/18 1102    Subjective  Patient reports she had 2 flat tires on Saturday. Feels like pain was exacerbated by that, however overall reports pain has been a little less intense over time. Exercises have been okay but has been busy and not able to do exercises consistently.     Pertinent History  stomach  ulcer, mitral valve prolapse, migraine, kidney stone, iron deficiency, HTN, HLD, heart murmur, GERD, DM, congenital deafness    Diagnostic tests  11/17/17 R rib xray: There is no active cardiopulmonary disease. No acute right rib abnormality is observed.    Patient Stated Goals  be able to get up on my own and not have to grab onto my dog. Be independent and move like before.     Currently in Pain?  Yes    Pain Score  6     Pain Location  Back    Pain Orientation  Right    Pain Descriptors / Indicators  Pressure;Nagging    Pain Type  Acute pain                       [facility] Adult PT Treatment/Exercise - 01/13/18 0001      Exercises   Exercises  Shoulder;Lumbar      Lumbar Exercises: Sidelying   Other Sidelying Lumbar Exercises  trunk rotation to tolerance 10x each side      Lumbar Exercises: Prone   Other Prone Lumbar Exercises  prone press up on elbows 10x   cues for form   Other Prone Lumbar Exercises  prone press up on hands  5x   report of discomfort- discontinued     Lumbar Exercises: Quadruped   [place]  10 reps;Limitations   cues for form and incorporate breaths     Shoulder Exercises: Standing   Row  Strengthening;Both;10 reps;Theraband;Limitations    Theraband Level (Shoulder Row)  Level 2 (Red)    Row Limitations  Cues for form      Shoulder Exercises: ROM/Strengthening   UBE (Upper Arm Bike)  L1 3/3      Modalities   Modalities  Electrical Stimulation;Moist Heat      Moist Heat Therapy   Number Minutes Moist Heat  10 Minutes    Moist Heat Location  Shoulder   draped over R thoracic spine     Electrical Stimulation   Electrical Stimulation Location  R posterolateral chest wall, targeting intercostals of ribs 5-7    Electrical Stimulation Action  IFC    Electrical Stimulation Parameters  intensity 5 to tolerance; 10 min    Electrical Stimulation Goals  Pain      Manual Therapy   Manual Therapy  Joint mobilization;Soft tissue  mobilization    Joint Mobilization  gentle grade 2/3 central PAs to T3-T8- hypomobility noted in T3-4 and T6-8    Soft tissue mobilization  gentle STM to R rhomboid, LS, thoracic paraspinals- no tenderness             PT Education - 01/13/18 1153    Education Details  update to HEP; edu sheet on personal TENs unit    Person(s) Educated  Patient    Methods  Explanation;Demonstration;Tactile cues;Verbal cues;Handout    Comprehension  Returned demonstration;Verbalized understanding       PT Short Term Goals - 01/13/18 1213      PT SHORT TERM GOAL #1   Title  Patient to be independent with initial HEP.    Time  4    Period  Weeks    Status  On-going        PT Long Term Goals - 01/13/18 1213      PT LONG TERM GOAL #1   Title  Patient to be independent with advanced HEP.    Time  8    Period  Weeks    Status  On-going      PT LONG TERM GOAL #2   Title  Patient to demonstrate [facility] and pain free thoracic AROM.    Time  8    Period  Weeks    Status  On-going      PT LONG TERM GOAL #3   Title  Patient to demonstrate symmetrical and pain-free R shoulder MMTing.     Time  8    Period  Weeks    Status  On-going      PT LONG TERM GOAL #4   Title  Patient to demonstrate lifting 5lb weight overhead with R UE without pain limiting.     Time  8    Period  Weeks    Status  On-going            Plan - 01/13/18 1208    Clinical Impression Statement  Patient present with interpreter. Patient admitting to busy schedule and noncompliance with HEP. Educated patient on importance of HEP compliance for improved symptom management and suggested breaking exercises up into AM and PM. Patient agreeable. Tolerated gentle STM to R rhomboid, LS, thoracic paraspinals with few tender spots. Increased pain and radiation of pain along R intercostals with central PAs over thoracic  spine, relieved with less pressure. Patient without c/o pain with sidelying trunk rotation. Notes that when  reaching overhead with R UE to overhead cabinet, having pulling pain in R anterior chest wall. Advised patient to avoid end-range stretching into pain, however not to avoid movement. Patient reported understanding. Cues required to correct periscapular strengthening ther-ex. Patient reporting 8/10 pain however improvement in flexibility after ther-ex. Received e-stim and moist heat to R thoracic spine at end of session. Patient reporting 3/10 pain at end of session. Educated patient on updated HEP and personal TENS unit for continued pain relief.      PT Treatment/Interventions  ADLs/Self Care Home Management;Cryotherapy;Electrical Stimulation;Iontophoresis 4mg /ml Dexamethasone;Moist Heat;Traction;Ultrasound;Functional mobility training;Therapeutic activities;Therapeutic exercise;Manual techniques;Patient/family education;Balance training;Passive range of motion;Dry needling;Energy conservation;Splinting;Taping;Neuromuscular re-education    Consulted and Agree with Plan of Care  Patient       Patient will benefit from skilled therapeutic intervention in order to improve the following deficits and impairments:  Hypomobility, Decreased activity tolerance, Decreased strength, Pain, Improper body mechanics, Postural dysfunction, Decreased range of motion  Visit Diagnosis: Pain in thoracic spine  Other symptoms and signs involving the musculoskeletal system     Problem List Patient Active Problem List   Diagnosis Date Noted  . Thumb injury, left, subsequent encounter 08/10/2017  . Right knee pain 08/10/2017  . Anemia, iron deficiency 04/11/2016  . Lactose intolerance 10/17/2015  . Osteoarthritis of both hands 12/27/2014  . Overactive bladder 06/19/2014  . Low back pain 05/10/2014  . Hematuria 12/23/2013  . Insomnia 05/31/2013  . Sciatica 01/28/2013  . Nocturnal leg cramps 11/03/2011  . Hyperlipidemia 05/02/2011  . 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 Irritable bowel syndrome 02/19/2011  .  Mitral valve prolapse 02/19/2011  . Migraine 02/19/2011  . General medical examination 01/17/2011  . Diabetic peripheral neuropathy (HCC) 01/17/2011  . GERD (gastroesophageal reflux disease) 12/18/2010  . Diabetes type 2, controlled (HCC) 12/18/2010     Anette GuarneriYevgeniya Xochilt Conant, PT, DPT 01/13/18 12:15 PM   [facility] [place] [address]  [address] [place], [place], [address] Phone: [phone]   Fax:  [phone]  Name: Molly May MRN: [id number] Date of Birth: 10/03/1960

## 2018-01-13 NOTE — Patient Instructions (Signed)

## 2018-01-22 ENCOUNTER — Ambulatory Visit (INDEPENDENT_AMBULATORY_CARE_PROVIDER_SITE_OTHER): Payer: Medicare Other | Admitting: Family

## 2018-01-22 ENCOUNTER — Encounter: Payer: Self-pay | Admitting: Family

## 2018-01-22 VITALS — BP 130/71 | HR 94 | Temp 97.9°F | Resp 16 | Ht 63.0 in | Wt 124.0 lb

## 2018-01-22 DIAGNOSIS — M545 Low back pain: Secondary | ICD-10-CM | POA: Diagnosis not present

## 2018-01-22 DIAGNOSIS — N3281 Overactive bladder: Secondary | ICD-10-CM | POA: Diagnosis not present

## 2018-01-22 DIAGNOSIS — E119 Type 2 diabetes mellitus without complications: Secondary | ICD-10-CM

## 2018-01-22 DIAGNOSIS — Z23 Encounter for immunization: Secondary | ICD-10-CM | POA: Diagnosis not present

## 2018-01-22 LAB — BASIC METABOLIC PANEL
BUN: 14 mg/dL (ref 6–23)
CHLORIDE: 99 meq/L (ref 96–112)
CO2: 31 mEq/L (ref 19–32)
Calcium: 9.6 mg/dL (ref 8.4–10.5)
Creatinine, Ser: 0.72 mg/dL (ref 0.40–1.20)
GFR: 89.44 mL/min (ref >60.00)
Glucose, Bld: 111 mg/dL — ABNORMAL HIGH (ref 70–99)
POTASSIUM: 4.1 meq/L (ref 3.5–5.1)
SODIUM: 135 meq/L (ref 135–145)

## 2018-01-22 LAB — HEMOGLOBIN A1C: HEMOGLOBIN A1C: 6.6 % — AB (ref 4.6–6.5)

## 2018-01-22 NOTE — Progress Notes (Signed)
Subjective:    Patient ID: Molly May, female    DOB: [birth year], 57 y.o.   MRN: [id number]  HPI  Patient is a 57 yr old female who presents today for follow up.  Lower back pain- lat visit we gave a trial of meloxicam and referred her to physical therapy. Reports that she applied a tens unt and some heat as well as physical therapy and this has really been helping.   DM2- reports sugars have been really fluctuating, has been really stressed and busy lately so she has had some dietary indiscretions.  Lab Results  Component Value Date   HGBA1C 6.5 09/14/2017   HGBA1C 6.6 (H) 06/10/2017   HGBA1C 6.7 (H) 10/13/2016   Lab Results  Component Value Date   MICROALBUR <0.7 07/09/2015   LDLCALC 59 06/10/2017   CREATININE 0.78 11/17/2017   OAB- cotninues the oxybutynin. Reports that she has occasional accidents.  Nocturia x 3 at night.  Hyperlipidemia-  Lab Results  Component Value Date   CHOL 161 06/10/2017   HDL 80.00 06/10/2017   LDLCALC 59 06/10/2017   TRIG 113.0 06/10/2017   CHOLHDL 2 06/10/2017       Review of Systems See HPI  Past Medical History:  Diagnosis Date  . Allergy   . Anxiety   . Anxiety disorder 02/19/2011  . 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 Depression   . Diabetes mellitus   . GERD (gastroesophageal reflux disease)   . Heart murmur   . Hyperlipidemia   . Hypertension   . Iron deficiency   . Kidney stone   . Migraine   . Mitral valve prolapse 02/19/2011  . Sleep apnea 02/19/2011  . Stomach ulcer 51yrs of age     Social History   Socioeconomic History  . Marital status: Single    Spouse name: Not on file  . Number of children: Not on file  . Years of education: Not on file  . Highest education level: Not on file  Occupational History  . Not on file  Social Needs  . Financial resource strain: Not on file  . Food insecurity:    Worry: Not on file    Inability: Not on file  . Transportation needs:    Medical: Not on file   Non-medical: Not on file  Tobacco Use  . Smoking status: Never Smoker  . Smokeless tobacco: Never Used  Substance and Sexual Activity  . Alcohol use: No  . Drug use: No  . Sexual activity: Not on file  Lifestyle  . Physical activity:    Days per week: Not on file    Minutes per session: Not on file  . Stress: Not on file  Relationships  . Social connections:    Talks on phone: Not on file    Gets together: Not on file    Attends religious service: Not on file    Active member of club or organization: Not on file    Attends meetings of clubs or organizations: Not on file    Relationship status: Not on file  . Intimate partner violence:    Fear of current or ex partner: Not on file    Emotionally abused: Not on file    Physically abused: Not on file    Forced sexual activity: Not on file  Other Topics Concern  . Not on file  Social History Narrative   Regular exercise:  Yes. 3-5 x weekly   Caffeine Use:  No  Married- lives with husband, 2 children (age 60 and 37).   Disabled- due to deafness and overall work. Couldn't keep up.  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          Past Surgical History:  Procedure Laterality Date  . ABDOMINAL HYSTERECTOMY  2000  . APPENDECTOMY  1986  . BREAST SURGERY  2011   biposy--benign  . CATARACT EXTRACTION Right 10/13/11  . CATARACT EXTRACTION Left 03/2014  . CHOLECYSTECTOMY  2011  . OVARIAN CYST REMOVAL  1986, 1988  . TONSILLECTOMY AND ADENOIDECTOMY  1973  . UPPER GASTROINTESTINAL ENDOSCOPY  2009   Gets them every 3 years    Family History  Problem Relation Age of Onset  . Kidney disease Mother        kidney failure  . Multiple sclerosis Mother   . Other Mother        polio  . Hyperlipidemia Father   . Heart disease Father   . Hypertension Father   . Stomach cancer Father   . Diabetes Maternal Aunt   . Heart disease Maternal Grandmother   .  Hypertension Maternal Grandmother   . Diabetes Maternal Grandmother   . Heart disease Maternal Grandfather   . Hypertension Maternal Grandfather     Allergies  Allergen Reactions  . Aspirin Other (See Comments)    Stomach pains  . Codeine Other (See Comments)    Nosebleed  . Eggs Or Egg-Derived Products     Pt reports allergy to RAW EGGS. ? Stomach upset, diarrhea  . Erythromycin Nausea And Vomiting  . Fluticasone Propionate     Stomach upset  . Penicillins Hives  . Prilosec [Omeprazole Magnesium]     migraine  . Vaseline [Petrolatum] Itching  . Vicodin [Hydrocodone-Acetaminophen]     twitching  . Zetia [Ezetimibe] Diarrhea    Current Outpatient Medications on File Prior to Visit  Medication Sig Dispense Refill  . BD PEN NEEDLE NANO U/F 32G X 4 MM MISC USE AS DIRECTED 100 each 4  . estradiol (ESTRACE) 1 MG tablet TAKE 1 TABLET BY MOUTH ONCE DAILY 90 tablet 1  . glucose blood (ONE TOUCH ULTRA TEST) test strip Use to check blood sugar every morning. 100 each 2  . Insulin Glargine (LANTUS SOLOSTAR) 100 UNIT/ML Solostar Pen Inject 5 Units into the skin daily at 10 pm. 1 pen 5  . Iron Combinations (IRON COMPLEX PO) Take 75 mg by mouth daily.    Marland Kitchen lisinopril (PRINIVIL,ZESTRIL) 2.5 MG tablet TAKE 1 TABLET BY MOUTH ONCE DAILY 90 tablet 1  . LIVALO 4 MG TABS TAKE 1 TABLET BY MOUTH ONCE DAILY 90 tablet 1  . oxybutynin (DITROPAN-XL) 5 MG 24 hr tablet TAKE 1 TABLET BY MOUTH ONCE DAILY AT BEDTIME 90 tablet 1  . pantoprazole (PROTONIX) 40 MG tablet Take 1 tablet by mouth every morning. 90 tablet 3  . pioglitazone (ACTOS) 30 MG tablet TAKE 1 TABLET BY MOUTH ONCE DAILY 90 tablet 1  . SitaGLIPtin-MetFORMIN HCl (JANUMET XR) 50-500 MG TB24 Take 2 tablets by mouth daily. 180 tablet 1   No current facility-administered medications on file prior to visit.     BP 130/71 (BP Location: Left Arm, Patient Position: Sitting, Cuff Size: Small)   Pulse 94   Temp 97.9 F (36.6 C) (Oral)   Resp 16    Ht 5\' 3"  (1.6 m)   Wt 124 lb (56.2 kg)  LMP 04/28/1998   SpO2 98%   BMI 21.97 kg/m       Objective:   Physical Exam  Constitutional: She appears well-developed and well-nourished.  Cardiovascular: Normal rate, regular rhythm and normal heart sounds.  No murmur heard. Pulmonary/Chest: Effort normal and breath sounds normal. No respiratory distress. She has no wheezes.  Psychiatric: She has a normal mood and affect. Her behavior is normal. Judgment and thought content normal.          Assessment & Plan:  Diabetes type 2-A1c is stable.  Continue current meds.  Low back pain-this is improving.  Monitor.    OAB- feels like oxybutynin helps. Continue same.

## 2018-01-22 NOTE — Patient Instructions (Signed)
Please complete lab work prior to leaving.   

## 2018-01-27 ENCOUNTER — Encounter: Payer: Self-pay | Admitting: Physical Therapy

## 2018-01-27 ENCOUNTER — Ambulatory Visit: Payer: Medicare Other | Attending: Family | Admitting: Physical Therapy

## 2018-01-27 DIAGNOSIS — R29898 Other symptoms and signs involving the musculoskeletal system: Secondary | ICD-10-CM | POA: Diagnosis not present

## 2018-01-27 DIAGNOSIS — M546 Pain in thoracic spine: Secondary | ICD-10-CM | POA: Insufficient documentation

## 2018-01-27 NOTE — Therapy (Signed)
[facility] [address]  [address] [place], [place], [address] Phone: [phone]   Fax:  [phone]  Physical Therapy Treatment  Patient Details  Name: Molly May MRN: [id number] Date of Birth: 15-Nov-1960 Referring Provider (PT): Sandford Craze, NP   Encounter Date: 01/27/2018  PT End of Session - 01/27/18 1155    Visit Number  3    Number of Visits  5    Date for PT Re-Evaluation  02/24/18    Authorization Type  UHC Medicare & Medicaid    PT Start Time  1058    PT Stop Time  1139    PT Time Calculation (min)  41 min    Activity Tolerance  Patient tolerated treatment well    Behavior During Therapy  [facility] for tasks assessed/performed       Past Medical History:  Diagnosis Date  . Allergy   . Anxiety   . Anxiety disorder 02/19/2011  . 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 Depression   . Diabetes mellitus   . GERD (gastroesophageal reflux disease)   . Heart murmur   . Hyperlipidemia   . Hypertension   . Iron deficiency   . Kidney stone   . Migraine   . Mitral valve prolapse 02/19/2011  . Sleep apnea 02/19/2011  . Stomach ulcer 6yrs of age    Past Surgical History:  Procedure Laterality Date  . ABDOMINAL HYSTERECTOMY  2000  . APPENDECTOMY  1986  . BREAST SURGERY  2011   biposy--benign  . CATARACT EXTRACTION Right 10/13/11  . CATARACT EXTRACTION Left 03/2014  . CHOLECYSTECTOMY  2011  . OVARIAN CYST REMOVAL  1986, 1988  . TONSILLECTOMY AND ADENOIDECTOMY  1973  . UPPER GASTROINTESTINAL ENDOSCOPY  2009   Gets them every 3 years    There were no vitals filed for this visit.  Subjective Assessment - 01/27/18 1057    Subjective  Reports she is feeling much better. Reports compliance with HEP. Had some stiffness in her back this AM- did exercises and it went away.     Patient is accompained by:  Interpreter    Pertinent History  stomach ulcer, mitral valve prolapse, migraine, kidney stone, iron  deficiency, HTN, HLD, heart murmur, GERD, DM, congenital deafness    Diagnostic tests  11/17/17 R rib xray: There is no active cardiopulmonary disease. No acute right rib abnormality is observed.    Patient Stated Goals  be able to get up on my own and not have to grab onto my dog. Be independent and move like before.     Currently in Pain?  No/denies                       [facility] Adult PT Treatment/Exercise - 01/27/18 0001      Exercises   Exercises  Shoulder;Lumbar      Lumbar Exercises: Supine   Other Supine Lumbar Exercises  thoracic extension over foam roll x10 to tolerance    Other Supine Lumbar Exercises  windshield wipers x20 to tolerance   cues for form     Lumbar Exercises: Prone   Other Prone Lumbar Exercises  prone press up on elbows 10x   heavy cues for form   Other Prone Lumbar Exercises  prone press up on hands 5x   discontinued d/t L elbow pain     Shoulder Exercises: Supine   Horizontal ABduction  Strengthening;Both;10 reps;Theraband    Theraband Level (Shoulder Horizontal  ABduction)  Level 2 (Red)    Horizontal ABduction Limitations  cues for scap retraction      Shoulder Exercises: Standing   External Rotation  Strengthening;Both;10 reps;Theraband;Limitations    Theraband Level (Shoulder External Rotation)  Level 2 (Red)    External Rotation Limitations  cues to avoid training    Row  Strengthening;Both;10 reps;Theraband;Limitations    Theraband Level (Shoulder Row)  Level 3 (Green)    Row Limitations  2x10; heavy cues for form      Shoulder Exercises: ROM/Strengthening   UBE (Upper Arm Bike)  L1 3/3      Shoulder Exercises: Stretch   Other Shoulder Stretches  B rhomboid stretch 2x30"    Other Shoulder Stretches  R QL stretch at doorway to tolerance 2x30"             PT Education - 01/27/18 1154    Education Details  update to HEP and review of previous HEP    Person(s) Educated  Patient    Methods   Explanation;Demonstration;Tactile cues;Verbal cues;Handout    Comprehension  Verbalized understanding;Returned demonstration       PT Short Term Goals - 01/13/18 1213      PT SHORT TERM GOAL #1   Title  Patient to be independent with initial HEP.    Time  4    Period  Weeks    Status  On-going        PT Long Term Goals - 01/13/18 1213      PT LONG TERM GOAL #1   Title  Patient to be independent with advanced HEP.    Time  8    Period  Weeks    Status  On-going      PT LONG TERM GOAL #2   Title  Patient to demonstrate [facility] and pain free thoracic AROM.    Time  8    Period  Weeks    Status  On-going      PT LONG TERM GOAL #3   Title  Patient to demonstrate symmetrical and pain-free R shoulder MMTing.     Time  8    Period  Weeks    Status  On-going      PT LONG TERM GOAL #4   Title  Patient to demonstrate lifting 5lb weight overhead with R UE without pain limiting.     Time  8    Period  Weeks    Status  On-going            Plan - 01/27/18 1155    Clinical Impression Statement  Patient present with interpreter. Patient arrived to session with report of improvement in R sided thoracic pain since last session. Reporting compliance with HEP. Reviewed prone press up, requiring heavy VC/TCs to correct form and avoid pain. Poor tolerance of press up on hands- discontinued. Patient reported relief with supine thoracic extension over foam bolster. Worked on resisted periscapular strengthening and UE stretching towards end of session with cues to correct form. Added supine horizontal abduction with banded resistance to HEP and removed sidelying trunk rotation from HEP in order to encourage compliance. Patient reporting 0-1/10 pain at end of session.    PT Treatment/Interventions  ADLs/Self Care Home Management;Cryotherapy;Electrical Stimulation;Iontophoresis 4mg /ml Dexamethasone;Moist Heat;Traction;Ultrasound;Functional mobility training;Therapeutic activities;Therapeutic  exercise;Manual techniques;Patient/family education;Balance training;Passive range of motion;Dry needling;Energy conservation;Splinting;Taping;Neuromuscular re-education    Consulted and Agree with Plan of Care  Patient       Patient will benefit from skilled therapeutic intervention in order  to improve the following deficits and impairments:  Hypomobility, Decreased activity tolerance, Decreased strength, Pain, Improper body mechanics, Postural dysfunction, Decreased range of motion  Visit Diagnosis: Pain in thoracic spine  Other symptoms and signs involving the musculoskeletal system     Problem List Patient Active Problem List   Diagnosis Date Noted  . Thumb injury, left, subsequent encounter 08/10/2017  . Right knee pain 08/10/2017  . Anemia, iron deficiency 04/11/2016  . Lactose intolerance 10/17/2015  . Osteoarthritis of both hands 12/27/2014  . Overactive bladder 06/19/2014  . Low back pain 05/10/2014  . Hematuria 12/23/2013  . Insomnia 05/31/2013  . Sciatica 01/28/2013  . Nocturnal leg cramps 11/03/2011  . Hyperlipidemia 05/02/2011  . 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 Irritable bowel syndrome 02/19/2011  . Mitral valve prolapse 02/19/2011  . Migraine 02/19/2011  . General medical examination 01/17/2011  . Diabetic peripheral neuropathy (HCC) 01/17/2011  . GERD (gastroesophageal reflux disease) 12/18/2010  . Diabetes type 2, controlled (HCC) 12/18/2010     Anette Guarneri, PT, DPT 01/27/18 12:04 PM   [facility] [address]  [address] [place], [place], [address] Phone: [phone]   Fax:  [phone]  Name: Molly May MRN: [id number] Date of Birth: 10-Oct-1960

## 2018-01-28 ENCOUNTER — Encounter: Payer: Self-pay | Admitting: Family

## 2018-01-28 MED ORDER — INSULIN GLARGINE 100 UNIT/ML SOLOSTAR PEN: 5.0000 [IU] | PEN_INJECTOR | Freq: Every day | SUBCUTANEOUS | 5 refills | Status: DC

## 2018-02-01 ENCOUNTER — Encounter: Payer: Self-pay | Admitting: Physician Assistant

## 2018-02-01 ENCOUNTER — Ambulatory Visit: Payer: Medicare Other | Admitting: Physician Assistant

## 2018-02-01 VITALS — BP 112/72 | HR 96 | Ht 61.5 in | Wt 123.0 lb

## 2018-02-01 DIAGNOSIS — K219 Gastro-esophageal reflux disease without esophagitis: Secondary | ICD-10-CM

## 2018-02-01 MED ORDER — PANTOPRAZOLE SODIUM 40 MG PO TBEC
DELAYED_RELEASE_TABLET | ORAL | 10 refills | Status: DC
Start: 2018-02-01 — End: 2019-06-22

## 2018-02-01 NOTE — Progress Notes (Addendum)
Subjective:    Patient ID: Molly May, female    DOB: 1960/09/30, 57 y.o.   MRN: [id number]  HPI Yarlin is a pleasant 57 year old white female, known to Dr. Rhea Belton and myself who comes in today for one-month follow-up after visit for ongoing heartburn, indigestion and nocturnal belching and burping which is been present for 1 month continuously.  She also had complaints of early satiety. Patient is status post cholecystectomy, has history of adult onset diabetes mellitus now insulin-dependent, and had prior work-up in 2016 for iron deficiency anemia with negative EGD, colonoscopy and capsule endoscopy.  She was started on pantoprazole 40 mg p.o. every morning, and a strict antireflux regimen.  We also gave her a step 3 gastroparesis diet and obtain gastric emptying scan which was normal. She comes in today saying that she is feeling very well.  She increase the Protonix to twice a day as she was still having a lot of nocturnal symptoms with belching burping and heartburn which would keep her from sleeping at night.  She says she is not having any symptoms on twice daily dosing and feels very good.  She no longer has complaints of early satiety and She has been following an antireflux regimen and diet.  Review of Systems Pertinent positive and negative review of systems were noted in the above HPI section.  All other review of systems was otherwise negative.  Outpatient Encounter Medications as of 02/01/2018  Medication Sig  . BD PEN NEEDLE NANO U/F 32G X 4 MM MISC USE AS DIRECTED  . estradiol (ESTRACE) 1 MG tablet TAKE 1 TABLET BY MOUTH ONCE DAILY  . glucose blood (ONE TOUCH ULTRA TEST) test strip Use to check blood sugar every morning.  . Insulin Glargine (LANTUS SOLOSTAR) 100 UNIT/ML Solostar Pen Inject 5 Units into the skin daily at 10 pm.  . Iron Combinations (IRON COMPLEX PO) Take 75 mg by mouth daily.  Marland Kitchen lisinopril (PRINIVIL,ZESTRIL) 2.5 MG tablet TAKE 1 TABLET BY MOUTH ONCE  DAILY  . LIVALO 4 MG TABS TAKE 1 TABLET BY MOUTH ONCE DAILY  . oxybutynin (DITROPAN-XL) 5 MG 24 hr tablet TAKE 1 TABLET BY MOUTH ONCE DAILY AT BEDTIME  . pantoprazole (PROTONIX) 40 MG tablet Take 1 tablet by mouth twice daily.  . pioglitazone (ACTOS) 30 MG tablet TAKE 1 TABLET BY MOUTH ONCE DAILY  . SitaGLIPtin-MetFORMIN HCl (JANUMET XR) 50-500 MG TB24 Take 2 tablets by mouth daily.  . [DISCONTINUED] pantoprazole (PROTONIX) 40 MG tablet Take 1 tablet by mouth every morning. (Patient taking differently: Take 40 mg by mouth 2 (two) times daily. )   No facility-administered encounter medications on file as of 02/01/2018.    Allergies  Allergen Reactions  . Aspirin Other (See Comments)    Stomach pains  . Codeine Other (See Comments)    Nosebleed  . Eggs Or Egg-Derived Products     Pt reports allergy to RAW EGGS. ? Stomach upset, diarrhea  . Erythromycin Nausea And Vomiting  . Fluticasone Propionate     Stomach upset  . Penicillins Hives  . Prilosec [Omeprazole Magnesium]     migraine  . Vaseline [Petrolatum] Itching  . Vicodin [Hydrocodone-Acetaminophen]     twitching  . Zetia [Ezetimibe] Diarrhea   Patient Active Problem List   Diagnosis Date Noted  . Thumb injury, left, subsequent encounter 08/10/2017  . Right knee pain 08/10/2017  . Anemia, iron deficiency 04/11/2016  . Lactose intolerance 10/17/2015  . Osteoarthritis of both hands 12/27/2014  .  Overactive bladder 06/19/2014  . Low back pain 05/10/2014  . Hematuria 12/23/2013  . Insomnia 05/31/2013  . Sciatica 01/28/2013  . Nocturnal leg cramps 11/03/2011  . Hyperlipidemia 05/02/2011  . 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 Irritable bowel syndrome 02/19/2011  . Mitral valve prolapse 02/19/2011  . Migraine 02/19/2011  . General medical examination 01/17/2011  . Diabetic peripheral neuropathy (HCC) 01/17/2011  . GERD (gastroesophageal reflux disease) 12/18/2010  . Diabetes type 2, controlled (HCC) 12/18/2010   Social  History   Socioeconomic History  . Marital status: Single    Spouse name: Not on file  . Number of children: Not on file  . Years of education: Not on file  . Highest education level: Not on file  Occupational History  . Not on file  Social Needs  . Financial resource strain: Not on file  . Food insecurity:    Worry: Not on file    Inability: Not on file  . Transportation needs:    Medical: Not on file    Non-medical: Not on file  Tobacco Use  . Smoking status: Never Smoker  . Smokeless tobacco: Never Used  Substance and Sexual Activity  . Alcohol use: No  . Drug use: No  . Sexual activity: Not on file  Lifestyle  . Physical activity:    Days per week: Not on file    Minutes per session: Not on file  . Stress: Not on file  Relationships  . Social connections:    Talks on phone: Not on file    Gets together: Not on file    Attends religious service: Not on file    Active member of club or organization: Not on file    Attends meetings of clubs or organizations: Not on file    Relationship status: Not on file  . Intimate partner violence:    Fear of current or ex partner: Not on file    Emotionally abused: Not on file    Physically abused: Not on file    Forced sexual activity: Not on file  Other Topics Concern  . Not on file  Social History Narrative   Regular exercise:  Yes. 3-5 x weekly   Caffeine Use:  No   Married- lives with husband, 2 children (age 77 and 75).   Disabled- due to deafness and overall work. Couldn't keep up.  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          Ms. Pehrson family history includes Diabetes in her maternal aunt and maternal grandmother; Heart disease in her father, maternal grandfather, and maternal grandmother; Hyperlipidemia in her father; Hypertension in her father, maternal grandfather, and maternal grandmother; Kidney disease in her mother; Multiple  sclerosis in her mother; Other in her mother; Stomach cancer in her father.      Objective:    Vitals:   02/01/18 1059  BP: 112/72  Pulse: 96    Physical Exam; well-developed white female in no acute distress, she is accompanied by a sign interpreter.  Blood pressure 112/72, pulse 96, BMI 22.8 not further examined today .       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  Symptoms have completely resolved on twice daily pantoprazole 40 mg and an antireflux regimen. No evidence of gastroparesis on recent gastric emptying scan  #2 adult onset diabetes mellitus-insulin-dependent #3 history of iron deficiency anemia  with complete GI work-up 2016 with EGD colonoscopy and capsule endoscopy all unrevealing #4 status post cholecystectomy  Plan; We discussed importance of adherence to an antireflux regimen including n.p.o. for 2 to 3 hours prior to bedtime and elevation of the back 45 degrees while sleeping  She will continue Protonix 40 mg p.o. twice daily x2 months and then encouraged her to try to decrease to once daily dosing.  She may do best with evening dose. She will follow-up with Dr. Rhea Belton or myself on an as-needed basis.  Greater than 50% of visit spent in canceling on management of GI disease. Amy S Esterwood PA-C 02/01/2018   Addendum: Reviewed and agree with assessment and management plan. Pyrtle, Carie Caddy, MD     Cc: Sandford Craze, NP

## 2018-02-01 NOTE — Patient Instructions (Addendum)
We sent refills for the Pantoprazole sodium 40 mg twice daily. [address], [employer]. Stay on the Pantoprazole sodium 40 mg twice daily for 2 months then try to decrease to once daily in the evening.  Continue Antireflux regimen. Do not eat or drink for 2-3 hours before bed.   Normal BMI (Body Mass Index- based on height and weight) is between 19 and 25. Your BMI today is Body mass index is 22.86 kg/m. Marland Kitchen Please consider follow up  regarding your BMI with your Primary Care Provider.

## 2018-02-10 ENCOUNTER — Ambulatory Visit: Payer: Medicare Other | Admitting: Physical Therapy

## 2018-02-10 ENCOUNTER — Encounter: Payer: Self-pay | Admitting: Physical Therapy

## 2018-02-10 DIAGNOSIS — M546 Pain in thoracic spine: Secondary | ICD-10-CM

## 2018-02-10 DIAGNOSIS — R29898 Other symptoms and signs involving the musculoskeletal system: Secondary | ICD-10-CM | POA: Diagnosis not present

## 2018-02-10 NOTE — Therapy (Addendum)
[facility] [address]  [place], [place], [address] Phone: [phone]   Fax:  [phone]  Physical Therapy Treatment  Patient Details  Name: Molly May MRN: [id number] Date of Birth: 1960/06/16 Referring Provider (PT): Debbrah Alar, NP   Progress Note Reporting Period 12/30/17 to 02/10/18  See note below for Objective Data and Assessment of Progress/Goals.    Encounter Date: 02/10/2018  PT End of Session - 02/10/18 1140    Visit Number  4    Number of Visits  5    Date for PT Re-Evaluation  02/24/18    Authorization Type  UHC Medicare & Medicaid    PT Start Time  1051    PT Stop Time  1139    PT Time Calculation (min)  48 min    Activity Tolerance  Patient tolerated treatment well    Behavior During Therapy  WFL for tasks assessed/performed       Past Medical History:  Diagnosis Date  . Allergy   . Anxiety   . Anxiety disorder 02/19/2011  . 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 Depression   . Diabetes mellitus   . GERD (gastroesophageal reflux disease)   . Heart murmur   . Hyperlipidemia   . Hypertension   . Iron deficiency   . Kidney stone   . Migraine   . Mitral valve prolapse 02/19/2011  . Sleep apnea 02/19/2011  . Stomach ulcer 75yr of age    Past Surgical History:  Procedure Laterality Date  . ABDOMINAL HYSTERECTOMY  2000  . APPENDECTOMY  1986  . BREAST SURGERY  2011   biposy--benign  . CATARACT EXTRACTION Right 10/13/11  . CATARACT EXTRACTION Left 03/2014  . CHOLECYSTECTOMY  2011  . OVARIAN CYST REMOVAL  1986, 1988  . TONSILLECTOMY AND ADENOIDECTOMY  1973  . UPPER GASTROINTESTINAL ENDOSCOPY  2009   Gets them every 3 years    There were no vitals filed for this visit.  Subjective Assessment - 02/10/18 1053    Subjective  Reports that she is not having any pain today and would be comfortable wrapping up with PT today. Has not been doing HEP with bands, but has been  doing the other ones. The exercise on elbows is pretty painful. Reports improvement in reaching, and putting away dished, and improvement in pain levels.     Pertinent History  stomach ulcer, mitral valve prolapse, migraine, kidney stone, iron deficiency, HTN, HLD, heart murmur, GERD, DM, congenital deafness    Diagnostic tests  11/17/17 R rib xray: There is no active cardiopulmonary disease. No acute right rib abnormality is observed.    Patient Stated Goals  be able to get up on my own and not have to grab onto my dog. Be independent and move like before.     Currently in Pain?  No/denies         [facility] - 02/10/18 0001      Observation/Other Assessments   Focus on Therapeutic Outcomes (FOTO)   Thoracic: 75 (25% limited, 34% predicted)      AROM   AROM Assessment Site  Thoracic    Thoracic Flexion  [facility]   Thoracic Extension  mildly limited    [facility] - [facility]   [facility]or[facility] - Left Side [facility]   Thoracic - Right Rotation  [facility]   [facility] - Left Rotation  [facility]     Strength  Strength Assessment Site  Shoulder    Right/Left Shoulder  Right;Left    Right Shoulder Flexion  4+/5    Right Shoulder ABduction  4+/5    Right Shoulder Internal Rotation  4+/5    Right Shoulder External Rotation  4+/5    Left Shoulder Flexion  4+/5    Left Shoulder ABduction  4+/5    Left Shoulder Internal Rotation  4+/5    Left Shoulder External Rotation  4+/5                   [facility] Adult PT Treatment/Exercise - 02/10/18 0001      Exercises   Exercises  Shoulder      Lumbar Exercises: Supine   Other Supine Lumbar Exercises  thoracic extension over foam roll x10 to tolerance   cues to bend knees and avoid pushing into pain     Lumbar Exercises: Sidelying   Other Sidelying Lumbar Exercises  trunk rotation to tolerance 10x each side      Lumbar Exercises: Prone   Other Prone Lumbar Exercises  prone press up on elbows 10x   reminder not to press up onto  hands to avoid pain     Lumbar Exercises: Quadruped   [place]  15 reps;Limitations    Madcat/Old Horse Limitations  cues for inhale/exhale      Shoulder Exercises: Standing   External Rotation  Strengthening;Both;Theraband;Limitations;15 reps    Theraband Level (Shoulder External Rotation)  Level 2 (Red)    External Rotation Limitations  good form    Row  Strengthening;Both;10 reps;Theraband;Limitations    Theraband Level (Shoulder Row)  Level 3 (Green)    Row Limitations  2x10; heavy cues for form             PT Education - 02/10/18 1140    Education Details  review and consolidation of HEP    Person(s) Educated  Patient    Methods  Explanation;Demonstration;Tactile cues;Verbal cues;Handout    Comprehension  Verbalized understanding;Returned demonstration       PT Short Term Goals - 02/10/18 1101      PT SHORT TERM GOAL #1   Title  Patient to be independent with initial HEP.    Time  4    Period  Weeks    Status  Achieved        PT Long Term Goals - 02/10/18 1101      PT LONG TERM GOAL #1   Title  Patient to be independent with advanced HEP.    Time  8    Period  Weeks    Status  Partially Met   reports intermittent compliance     PT LONG TERM GOAL #2   Title  Patient to demonstrate [facility] and pain free thoracic AROM.    Time  8    Period  Weeks    Status  Achieved      PT LONG TERM GOAL #3   Title  Patient to demonstrate symmetrical and pain-free R shoulder MMTing.     Time  8    Period  Weeks    Status  Achieved      PT LONG TERM GOAL #4   Title  Patient to demonstrate lifting 5lb weight overhead with R UE without pain limiting.     Time  8    Period  Weeks    Status  Achieved            Plan - 02/10/18 1142  Clinical Impression Statement  Patient presenting today with interpreter and interpreting student. Reports large improvement in ability to reach, put away dishes, as well as pain levels since starting PT. Reports that she  feels comfortable to transition to HEP at this time. Updated goals- patient reporting intermittent compliance with HEP. Has met thoracic ROM, strength, and overhead reaching goal at this time. Able to perform all exercises today without pain. Did report pain with prone one elbows and supine thoracic extension over foam roll at home, however with cues given today to correct technique patient reporting no problems. Reviewed and consolidated HEP to include exercises performed today. Patient reported understanding. Patient to be placed on 30 day hold at this time d/t goals being met and patient pain-free.     PT Treatment/Interventions  ADLs/Self Care Home Management;Cryotherapy;Electrical Stimulation;Iontophoresis 75m/ml Dexamethasone;Moist Heat;Traction;Ultrasound;Functional mobility training;Therapeutic activities;Therapeutic exercise;Manual techniques;Patient/family education;Balance training;Passive range of motion;Dry needling;Energy conservation;Splinting;Taping;Neuromuscular re-education    Consulted and Agree with Plan of Care  Patient       Patient will benefit from skilled therapeutic intervention in order to improve the following deficits and impairments:  Hypomobility, Decreased activity tolerance, Decreased strength, Pain, Improper body mechanics, Postural dysfunction, Decreased range of motion  Visit Diagnosis: Pain in thoracic spine  Other symptoms and signs involving the musculoskeletal system     Problem List Patient Active Problem List   Diagnosis Date Noted  . Thumb injury, left, subsequent encounter 08/10/2017  . Right knee pain 08/10/2017  . Anemia, iron deficiency 04/11/2016  . Lactose intolerance 10/17/2015  . Osteoarthritis of both hands 12/27/2014  . Overactive bladder 06/19/2014  . Low back pain 05/10/2014  . Hematuria 12/23/2013  . Insomnia 05/31/2013  . Sciatica 01/28/2013  . Nocturnal leg cramps 11/03/2011  . Hyperlipidemia 05/02/2011  . Congenital deafness  02/19/2011  . Irritable bowel syndrome 02/19/2011  . Mitral valve prolapse 02/19/2011  . Migraine 02/19/2011  . General medical examination 01/17/2011  . Diabetic peripheral neuropathy ([place] 01/17/2011  . GERD (gastroesophageal reflux disease) 12/18/2010  . Diabetes type 2, controlled ([place] 12/18/2010    YJanene Harvey PT, DPT 02/10/18 11:49 AM   [facility] [address] [place] [address]Phone: [phone]  Fax:  [phone] Name: Molly MANUKYANMRN: [id number] of Birth: [birth year] PHYSICAL THERAPY DISCHARGE SUMMARY  Visits from Start of Care: 4  Current functional level related to goals / functional outcomes: See above clinical impression   Remaining deficits: Intermittent HEP noncompliance   Education / Equipment: HEP  Plan: Patient agrees to discharge.  Patient goals were partially met. Patient is being discharged due to meeting the stated rehab goals.  ?????     YJanene Harvey PT, DPT 03/15/18 10:33 AM

## 2018-02-24 ENCOUNTER — Encounter: Payer: Self-pay | Admitting: Physical Therapy

## 2018-04-25 ENCOUNTER — Other Ambulatory Visit: Payer: Self-pay | Admitting: Family

## 2018-04-25 NOTE — Telephone Encounter (Signed)
Please make sure pt is receiving yearly mammograms. TY.

## 2018-04-26 ENCOUNTER — Ambulatory Visit (INDEPENDENT_AMBULATORY_CARE_PROVIDER_SITE_OTHER): Payer: Medicare Other | Admitting: Family

## 2018-04-26 ENCOUNTER — Encounter: Payer: Self-pay | Admitting: Family

## 2018-04-26 VITALS — BP 125/67 | HR 80 | Temp 97.8°F | Resp 16 | Ht 62.0 in | Wt 123.0 lb

## 2018-04-26 DIAGNOSIS — E559 Vitamin D deficiency, unspecified: Secondary | ICD-10-CM

## 2018-04-26 DIAGNOSIS — K219 Gastro-esophageal reflux disease without esophagitis: Secondary | ICD-10-CM

## 2018-04-26 DIAGNOSIS — E119 Type 2 diabetes mellitus without complications: Secondary | ICD-10-CM

## 2018-04-26 LAB — BASIC METABOLIC PANEL
BUN: 11 mg/dL (ref 6–23)
CO2: 29 mEq/L (ref 19–32)
CREATININE: 0.77 mg/dL (ref 0.40–1.20)
Calcium: 9.6 mg/dL (ref 8.4–10.5)
Chloride: 98 mEq/L (ref 96–112)
GFR: 82.7 mL/min (ref >60.00)
Glucose, Bld: 99 mg/dL (ref 70–99)
Potassium: 4.1 mEq/L (ref 3.5–5.1)
Sodium: 135 mEq/L (ref 135–145)

## 2018-04-26 LAB — HEMOGLOBIN A1C: Hgb A1c MFr Bld: 7 % — ABNORMAL HIGH (ref 4.6–6.5)

## 2018-04-26 NOTE — Patient Instructions (Signed)
Please complete lab work prior to leaving.   

## 2018-04-26 NOTE — Progress Notes (Signed)
Subjective:    Patient ID: Molly May, female    DOB: 12/06/1960, 57 y.o.   MRN: [id number]  HPI  Patient is a 57 yr old female who presents today for follow up. Sign language interpreter is present for today's visit.   DM2- maintained on janumet xr actos, lantus. Lab Results  Component Value Date   HGBA1C 6.6 (H) 01/22/2018   HGBA1C 6.5 09/14/2017   HGBA1C 6.6 (H) 06/10/2017   Lab Results  Component Value Date   MICROALBUR <0.7 07/09/2015   LDLCALC 59 06/10/2017   CREATININE 0.72 01/22/2018   GERD- maintained on protonix. Reports that she did go to GI and protonix is helping.  Reports that she has bene taking vitamin D which is helping with the arthritis pain in her hands.  She is taking 2000 units/daily.    Review of Systems See HPI  Past Medical History:  Diagnosis Date  . Allergy   . Anxiety   . Anxiety disorder 02/19/2011  . 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 Depression   . Diabetes mellitus   . GERD (gastroesophageal reflux disease)   . Heart murmur   . Hyperlipidemia   . Hypertension   . Iron deficiency   . Kidney stone   . Migraine   . Mitral valve prolapse 02/19/2011  . Sleep apnea 02/19/2011  . Stomach ulcer [age]yrs of age     Social History   Socioeconomic History  . Marital status: Single    Spouse name: Not on file  . Number of children: Not on file  . Years of education: Not on file  . Highest education level: Not on file  Occupational History  . Not on file  Social Needs  . Financial resource strain: Not on file  . Food insecurity:    Worry: Not on file    Inability: Not on file  . Transportation needs:    Medical: Not on file    Non-medical: Not on file  Tobacco Use  . Smoking status: Never Smoker  . Smokeless tobacco: Never Used  Substance and Sexual Activity  . Alcohol use: No  . Drug use: No  . Sexual activity: Not on file  Lifestyle  . Physical activity:    Days per week: Not on file    Minutes per session: Not  on file  . Stress: Not on file  Relationships  . Social connections:    Talks on phone: Not on file    Gets together: Not on file    Attends religious service: Not on file    Active member of club or organization: Not on file    Attends meetings of clubs or organizations: Not on file    Relationship status: Not on file  . Intimate partner violence:    Fear of current or ex partner: Not on file    Emotionally abused: Not on file    Physically abused: Not on file    Forced sexual activity: Not on file  Other Topics Concern  . Not on file  Social History Narrative   Regular exercise:  Yes. 3-5 x weekly   Caffeine Use:  No   Married- lives with husband, 2 children (age 207 and 228).   Disabled- due to deafness and overall work. Couldn't keep up.  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          Past Surgical History:  Procedure Laterality Date  . ABDOMINAL HYSTERECTOMY  2000  . APPENDECTOMY  1986  . BREAST SURGERY  2011   biposy--benign  . CATARACT EXTRACTION Right 10/13/11  . CATARACT EXTRACTION Left 03/2014  . CHOLECYSTECTOMY  2011  . OVARIAN CYST REMOVAL  1986, 1988  . TONSILLECTOMY AND ADENOIDECTOMY  1973  . UPPER GASTROINTESTINAL ENDOSCOPY  2009   Gets them every 3 years    Family History  Problem Relation Age of Onset  . Kidney disease Mother        kidney failure  . Multiple sclerosis Mother   . Other Mother        polio  . Hyperlipidemia Father   . Heart disease Father   . Hypertension Father   . Stomach cancer Father   . Diabetes Maternal Aunt   . Heart disease Maternal Grandmother   . Hypertension Maternal Grandmother   . Diabetes Maternal Grandmother   . Heart disease Maternal Grandfather   . Hypertension Maternal Grandfather     Allergies  Allergen Reactions  . Aspirin Other (See Comments)    Stomach pains  . Codeine Other (See Comments)    Nosebleed  . Eggs Or Egg-Derived  Products     Pt reports allergy to RAW EGGS. ? Stomach upset, diarrhea  . Erythromycin Nausea And Vomiting  . Fluticasone Propionate     Stomach upset  . Penicillins Hives  . Prilosec [Omeprazole Magnesium]     migraine  . Vaseline [Petrolatum] Itching  . Vicodin [Hydrocodone-Acetaminophen]     twitching  . Zetia [Ezetimibe] Diarrhea    Current Outpatient Medications on File Prior to Visit  Medication Sig Dispense Refill  . BD PEN NEEDLE NANO U/F 32G X 4 MM MISC USE AS DIRECTED 100 each 4  . Cholecalciferol (VITAMIN D) 50 MCG (2000 UT) tablet     . estradiol (ESTRACE) 1 MG tablet TAKE 1 TABLET BY MOUTH ONCE DAILY 90 tablet 0  . glucose blood (ONE TOUCH ULTRA TEST) test strip Use to check blood sugar every morning. 100 each 2  . Insulin Glargine (LANTUS SOLOSTAR) 100 UNIT/ML Solostar Pen Inject 5 Units into the skin daily at 10 pm. 15 mL 5  . Iron Combinations (IRON COMPLEX PO) Take 75 mg by mouth daily.    Marland Kitchen. JANUMET XR 50-500 MG TB24 TAKE 2 TABLETS BY MOUTH ONCE DAILY 180 tablet 0  . lisinopril (PRINIVIL,ZESTRIL) 2.5 MG tablet TAKE 1 TABLET BY MOUTH ONCE DAILY 90 tablet 0  . LIVALO 4 MG TABS TAKE 1 TABLET BY MOUTH ONCE DAILY 90 tablet 1  . oxybutynin (DITROPAN-XL) 5 MG 24 hr tablet TAKE 1 TABLET BY MOUTH ONCE DAILY AT BEDTIME 90 tablet 0  . pantoprazole (PROTONIX) 40 MG tablet Take 1 tablet by mouth twice daily. 60 tablet 10  . pioglitazone (ACTOS) 30 MG tablet TAKE 1 TABLET BY MOUTH ONCE DAILY 90 tablet 1   No current facility-administered medications on file prior to visit.     BP 125/67 (BP Location: Left Arm, Patient Position: Sitting, Cuff Size: Small)   Pulse 80   Temp 97.8 F (36.6 C) (Oral)   Resp 16   Ht 5\' 2"  (1.575 m)   Wt 123 lb (55.8 kg)   LMP 04/28/1998   SpO2 100%   BMI 22.50 kg/m       Objective:   Physical Exam Constitutional:      Appearance: She is well-developed.  Cardiovascular:     Rate and Rhythm: Normal rate and  regular rhythm.     Heart  sounds: Normal heart sounds. No murmur.  Pulmonary:     Effort: Pulmonary effort is normal. No respiratory distress.     Breath sounds: Normal breath sounds. No wheezing.  Psychiatric:        Behavior: Behavior normal.        Thought Content: Thought content normal.        Judgment: Judgment normal.           Assessment & Plan:  DM2- A1C up a touch. Continue diabetic diet. Continue current meds.   GERD- stable on PPI, continue same.   Vit D deficiency- continue vit d supplement.

## 2018-05-02 ENCOUNTER — Other Ambulatory Visit: Payer: Self-pay | Admitting: Family

## 2018-05-23 ENCOUNTER — Other Ambulatory Visit: Payer: Self-pay | Admitting: Family

## 2018-06-13 IMAGING — MR MR [PERSON_NAME]*[PERSON_NAME]* W/O CM
5 series · 34 of 40 positions shown · non-contrast
Comparison: None.

CLINICAL DATA: Status post fall 05/19/2017.  UCL tenderness.

EXAM:
MRI OF THE LEFT HAND WITHOUT CONTRAST
TECHNIQUE: Multiplanar, multisequence MR imaging of the left thumb was
performed. No intravenous contrast was administered.

[Series 3: T2 fat-sat · axial · 4.0mm · 0.51mm/px · z∈[-42,+65]mm · 8 of 25 slices shown (1 of 2)]
[im 1/25]
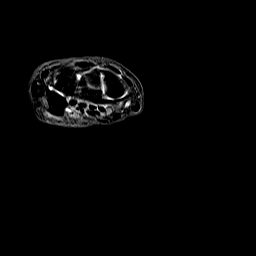
[im 4/25]
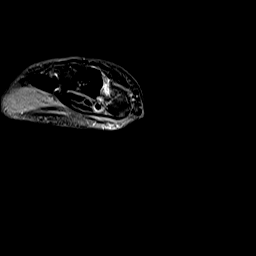
[im 7/25]
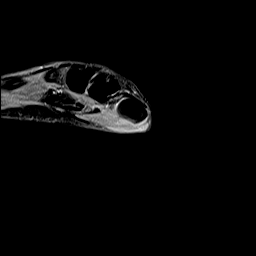
[im 11/25]
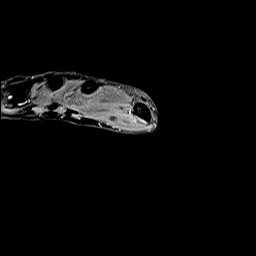
[im 14/25]
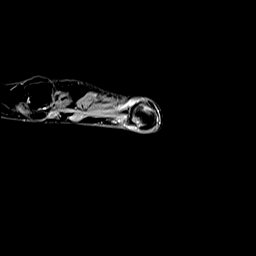
[im 18/25]
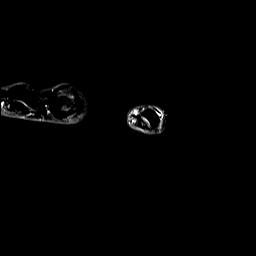
[im 21/25]
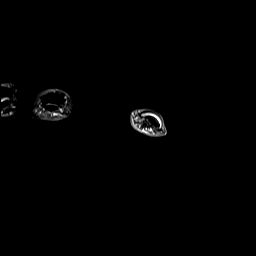
[im 25/25]
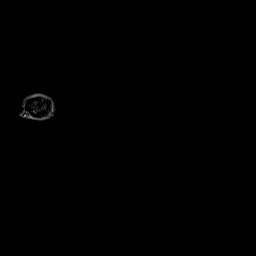

[Series 4: T1 · axial · 4.0mm · 0.41mm/px · z∈[-42,+65]mm · 9 of 25 slices shown]
[im 1/25]
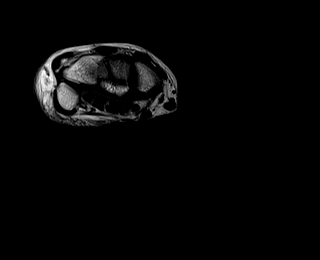
[im 4/25]
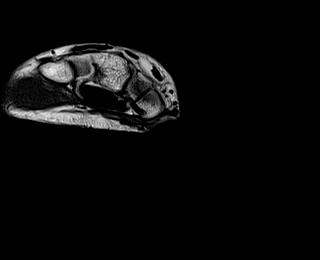
[im 7/25]
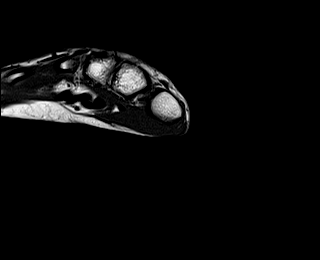
[im 10/25]
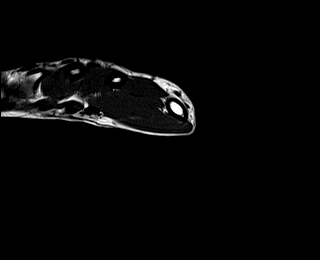
[im 13/25]
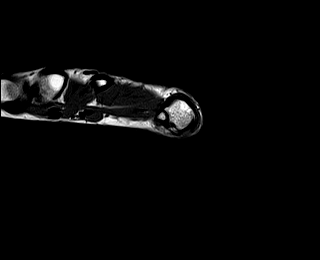
[im 16/25]
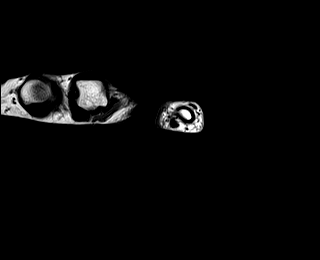
[im 19/25]
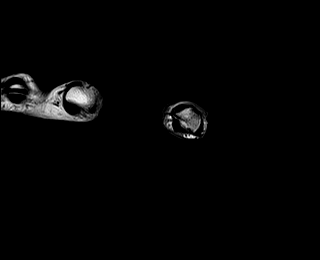
[im 22/25]
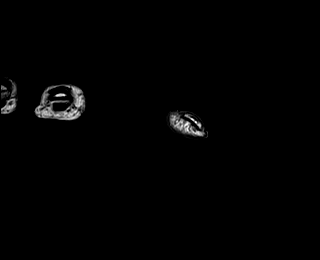
[im 25/25]
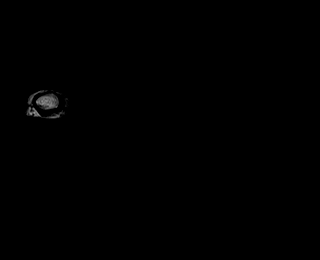

[Series 6: STIR · oblique · 2.0mm · 0.50mm/px · 3 of 26 slices shown]
[im 1/26]
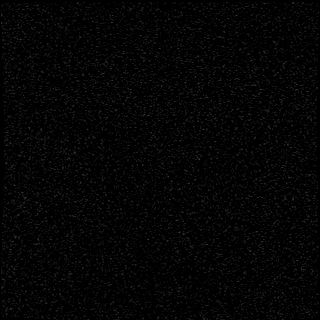
[im 4/26]
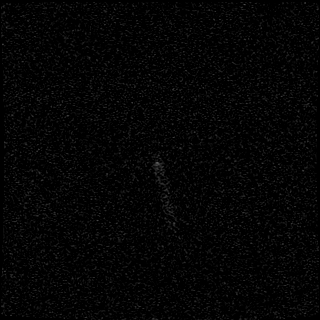
[im 7/26]
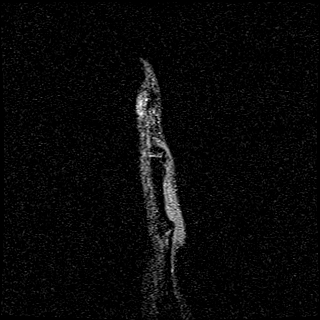

[Series 7: PD fat-sat · coronal · 2.0mm · 0.25mm/px · 7 of 19 slices shown]
[im 1/19]
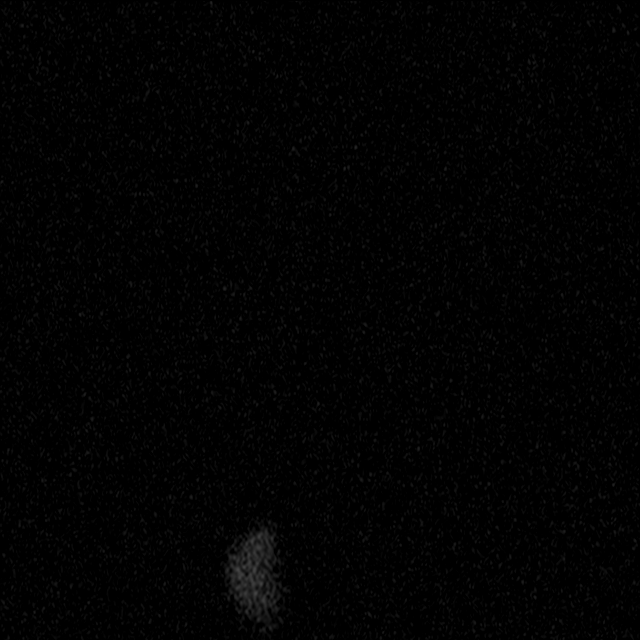
[im 4/19]
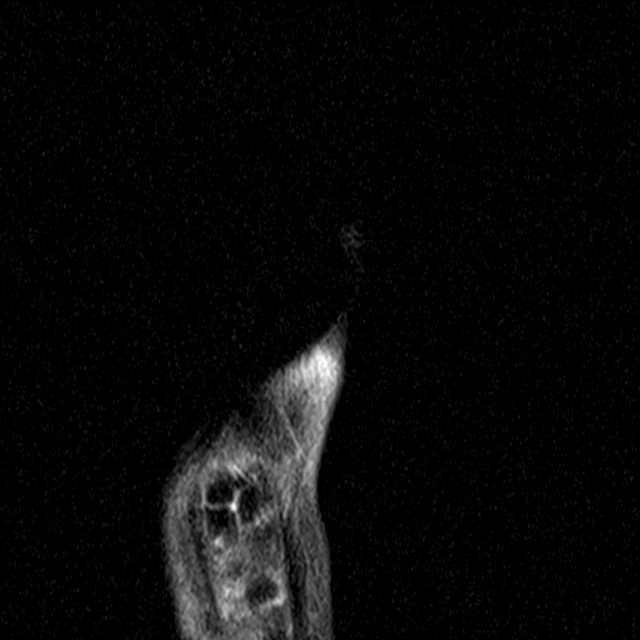
[im 7/19]
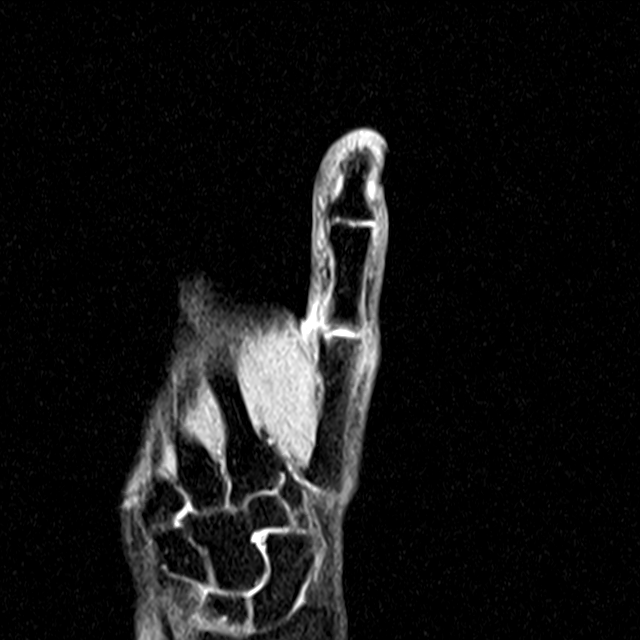
[im 10/19]
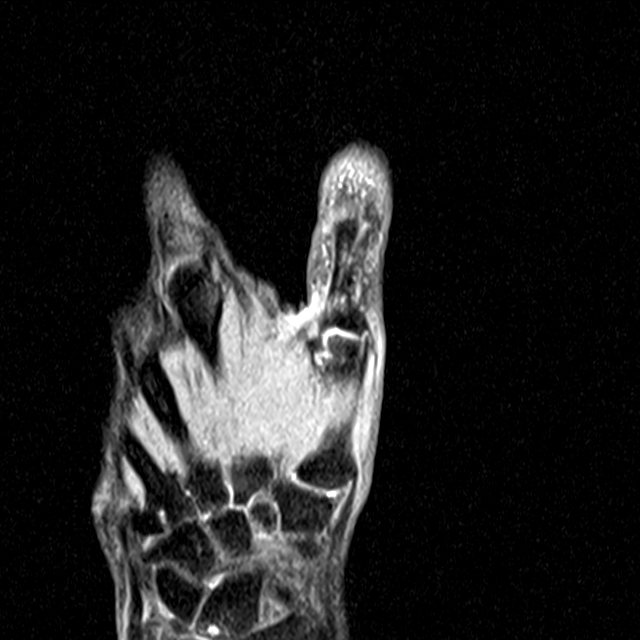
[im 13/19]
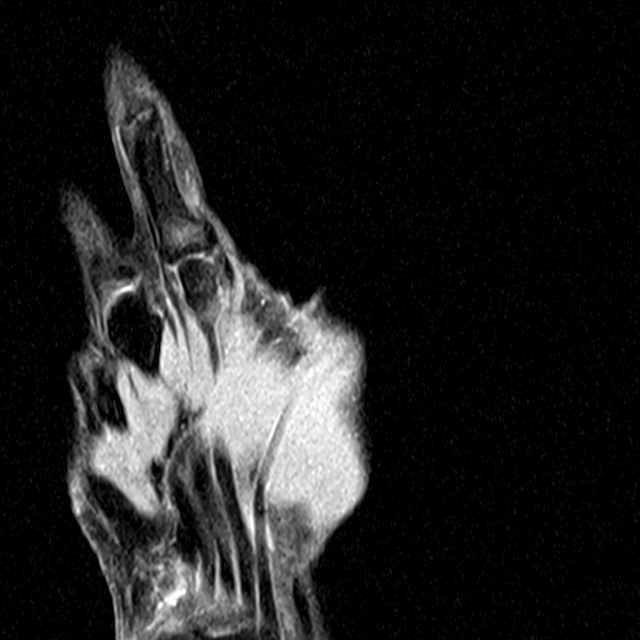
[im 16/19]
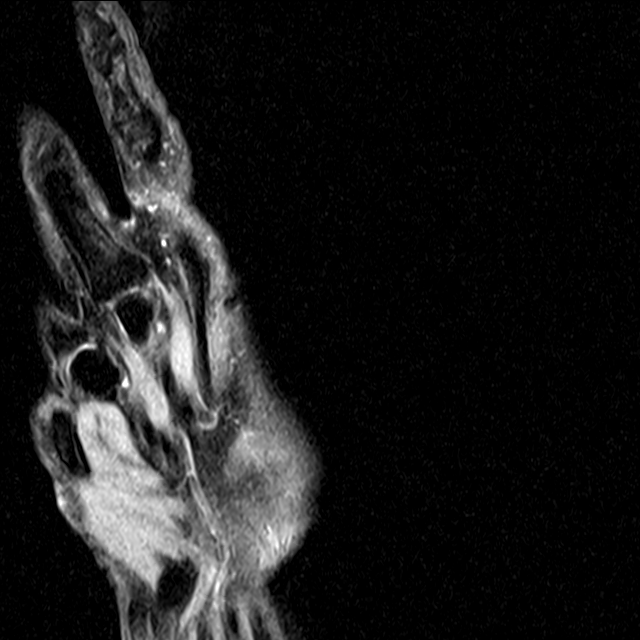
[im 19/19]
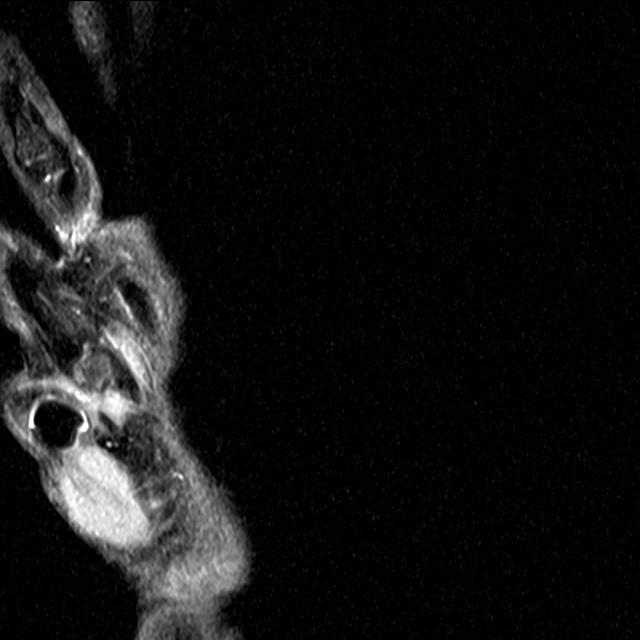

[Series 8: T2 fat-sat · coronal · 2.0mm · 0.62mm/px · 7 of 19 slices shown (2 of 2)]
[im 1/19]
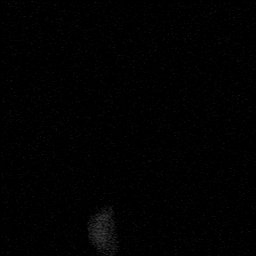
[im 4/19]
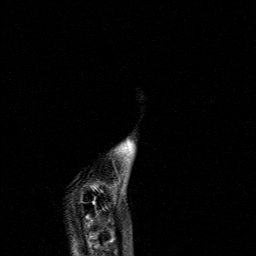
[im 7/19]
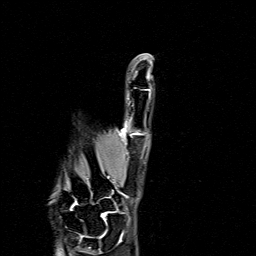
[im 10/19]
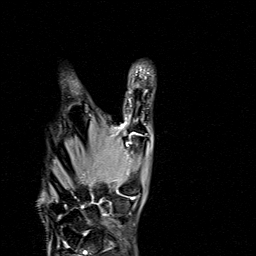
[im 13/19]
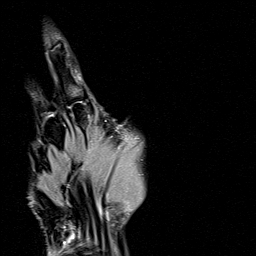
[im 16/19]
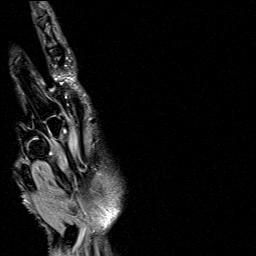
[im 19/19]
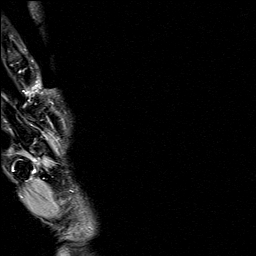

[34 of 40 positions shown; findings below may reference images not displayed]

FINDINGS: Bones/Joint/Cartilage

No fracture or dislocation. Normal alignment. No joint effusion.

Subchondral reactive marrow changes along the proximal ulnar aspect
of the lunate as can be seen with ulnar abutment syndrome.

Ligaments

Distal insertion of the radial collateral ligament of the first MCP
joint is severely attenuated with increased signal consistent with a
high-grade partial-thickness versus complete tear. Ulnar collateral
ligament is intact.

Muscles and Tendons
Flexor and extensor compartment tendons are intact. Muscles are
normal.

Soft tissue
No fluid collection or hematoma.  No soft tissue mass.
IMPRESSION: 1. Distal insertion of the radial collateral ligament of the first
MCP joint is severely attenuated with increased signal consistent
with a high-grade partial-thickness versus complete tear.
2. Ulnar collateral ligament is intact.
3. Subchondral reactive marrow changes along the proximal ulnar
aspect of the lunate as can be seen with ulnar abutment syndrome.

## 2018-07-26 ENCOUNTER — Ambulatory Visit: Payer: Medicare Other | Admitting: Family

## 2018-07-29 ENCOUNTER — Other Ambulatory Visit: Payer: Self-pay | Admitting: Family Medicine

## 2018-08-13 ENCOUNTER — Encounter: Payer: Self-pay | Admitting: Family

## 2018-08-14 ENCOUNTER — Other Ambulatory Visit: Payer: Self-pay | Admitting: Family Medicine

## 2018-08-15 ENCOUNTER — Other Ambulatory Visit: Payer: Self-pay | Admitting: Family

## 2018-08-15 ENCOUNTER — Other Ambulatory Visit: Payer: Self-pay | Admitting: Family Medicine

## 2018-08-26 ENCOUNTER — Other Ambulatory Visit: Payer: Self-pay

## 2018-08-26 NOTE — Patient Outreach (Signed)
[facility] Customer service manager [facility]) Care Management  08/26/2018  Molly May Apr 14, 1961 [id number]   Medication Adherence call to Molly May Telephone call to Patient regarding Medication Adherence unable to reach patient patient is due on Livalo 4 mg under [facility] Ins.   Lillia Abed CPhT Pharmacy Technician [facility] Care Management Direct Dial [phone]  Fax [phone] [email or web address]

## 2018-09-06 ENCOUNTER — Ambulatory Visit (INDEPENDENT_AMBULATORY_CARE_PROVIDER_SITE_OTHER): Payer: Medicare Other | Admitting: Family

## 2018-09-06 ENCOUNTER — Other Ambulatory Visit: Payer: Self-pay

## 2018-09-06 DIAGNOSIS — E785 Hyperlipidemia, unspecified: Secondary | ICD-10-CM

## 2018-09-06 DIAGNOSIS — K219 Gastro-esophageal reflux disease without esophagitis: Secondary | ICD-10-CM

## 2018-09-06 DIAGNOSIS — E119 Type 2 diabetes mellitus without complications: Secondary | ICD-10-CM

## 2018-09-06 MED ORDER — PITAVASTATIN CALCIUM 4 MG PO TABS: 1.0000 | ORAL_TABLET | Freq: Every day | ORAL | 1 refills | Status: DC

## 2018-09-06 MED ORDER — BLOOD GLUCOSE MONITOR KIT: PACK | 0 refills | Status: AC

## 2018-09-06 NOTE — Progress Notes (Signed)
Virtual Visit via Video Note  I connected with Mozel on 09/06/18 at 11:40 AM EDT by a video enabled telemedicine application and verified that I am speaking with the correct person using two identifiers. This visit type was conducted due to national recommendations for restrictions regarding the COVID-19 Pandemic (e.g. social distancing).  This format is felt to be most appropriate for this patient at this time.  Unfortunately, we had difficulty trying to connect via text chat on video visit and her family member was unable to translate. As a result I connect via phone and the visit was translated via phone tranlator.    I discussed the limitations of evaluation and management by telemedicine and the availability of in person appointments. The patient expressed understanding and agreed to proceed.  Only the patient and myself were on today's video visit. The patient was at home and I was in my office at home at the time of today's visit.   History of Present Illness:  Patient is a 58 yr old female who presents today for routine follow up.  DM2- sugars have been "fine." but the the monitor is broken.  Needs another machine.  Previously her sugars were around 100.  Lab Results  Component Value Date   HGBA1C 7.0 (H) 04/26/2018   HGBA1C 6.6 (H) 01/22/2018   HGBA1C 6.5 09/14/2017   Lab Results  Component Value Date   MICROALBUR <0.7 07/09/2015   LDLCALC 59 06/10/2017   CREATININE 0.77 04/26/2018   GERD- maintained on protonix. Denies any gerd symptoms.    Hyperlipidemia- reports tolerating Livalo.    Reports some urinary frequency at night. But ok during the day. Continues ditropan.     Observations/Objective:   Gen: Awake, alert, no acute distress Resp: Breathing is even and non-labored Psych: calm/pleasant demeanor Neuro: Alert and Oriented x 3, + facial symmetry  Assessment and Plan:  DM2- reports sugars were stable. Continue current meds/dosing. Will fax rx for a new  glucometer with lancets/test strips.  GERD- stable on PPI.  Hyperlipidemia- tolerating livalo. Continue same.    Follow Up Instructions:    I discussed the assessment and treatment plan with the patient. The patient was provided an opportunity to ask questions and all were answered. The patient agreed with the plan and demonstrated an understanding of the instructions.   The patient was advised to call back or seek an in-person evaluation if the symptoms worsen or if the condition fails to improve as anticipated.    Lemont Fillers, NP

## 2018-10-19 ENCOUNTER — Other Ambulatory Visit: Payer: Self-pay | Admitting: Family

## 2018-10-28 ENCOUNTER — Other Ambulatory Visit: Payer: Self-pay | Admitting: Family

## 2018-11-19 ENCOUNTER — Other Ambulatory Visit: Payer: Self-pay

## 2018-11-19 NOTE — Patient Outreach (Signed)
[facility] [place] [facility]) Care Management  11/19/2018  Molly May 1960/07/17 [id number]   Medication Adherence call to [name] patient did not answer patient is past due on Lisinopril 2.5 mg patient has an interpretor for all calls.[name] is showing past due under [place].   [place] Management Direct Dial [phone]  Fax [phone] [email or web address]

## 2018-11-25 ENCOUNTER — Other Ambulatory Visit: Payer: Self-pay

## 2018-11-25 MED ORDER — LISINOPRIL 2.5 MG PO TABS: 2.5000 mg | ORAL_TABLET | Freq: Every day | ORAL | 0 refills | Status: DC

## 2018-11-29 ENCOUNTER — Other Ambulatory Visit: Payer: Self-pay | Admitting: Family Medicine

## 2018-12-07 ENCOUNTER — Encounter: Payer: Self-pay | Admitting: Family

## 2018-12-07 ENCOUNTER — Other Ambulatory Visit: Payer: Self-pay

## 2018-12-07 ENCOUNTER — Ambulatory Visit (INDEPENDENT_AMBULATORY_CARE_PROVIDER_SITE_OTHER): Payer: Medicare Other | Admitting: Family

## 2018-12-07 VITALS — BP 129/70 | HR 94 | Temp 97.5°F | Resp 16 | Ht 62.0 in | Wt 125.6 lb

## 2018-12-07 DIAGNOSIS — E119 Type 2 diabetes mellitus without complications: Secondary | ICD-10-CM | POA: Diagnosis not present

## 2018-12-07 DIAGNOSIS — K219 Gastro-esophageal reflux disease without esophagitis: Secondary | ICD-10-CM

## 2018-12-07 DIAGNOSIS — E785 Hyperlipidemia, unspecified: Secondary | ICD-10-CM

## 2018-12-07 DIAGNOSIS — B349 Viral infection, unspecified: Secondary | ICD-10-CM | POA: Diagnosis not present

## 2018-12-07 DIAGNOSIS — Z0000 Encounter for general adult medical examination without abnormal findings: Secondary | ICD-10-CM

## 2018-12-07 LAB — COMPREHENSIVE METABOLIC PANEL
ALT: 10 U/L (ref 0–35)
AST: 14 U/L (ref 0–37)
Albumin: 4.2 g/dL (ref 3.5–5.2)
Alkaline Phosphatase: 47 U/L (ref 39–117)
BUN: 17 mg/dL (ref 6–23)
CO2: 30 mEq/L (ref 19–32)
Calcium: 9.8 mg/dL (ref 8.4–10.5)
Chloride: 96 mEq/L (ref 96–112)
Creatinine, Ser: 0.74 mg/dL (ref 0.40–1.20)
GFR: 81.27 mL/min (ref >60.00)
Glucose, Bld: 190 mg/dL — ABNORMAL HIGH (ref 70–99)
Potassium: 3.7 mEq/L (ref 3.5–5.1)
Sodium: 132 mEq/L — ABNORMAL LOW (ref 135–145)
Total Bilirubin: 0.4 mg/dL (ref 0.2–1.2)
Total Protein: 7.1 g/dL (ref 6.0–8.3)

## 2018-12-07 LAB — LIPID PANEL
Cholesterol: 149 mg/dL (ref 0–200)
HDL: 69.2 mg/dL (ref >39.00)
LDL Cholesterol: 55 mg/dL (ref 0–99)
NonHDL: 80.18
Total CHOL/HDL Ratio: 2
Triglycerides: 127 mg/dL (ref 0.0–149.0)
VLDL: 25.4 mg/dL (ref 0.0–40.0)

## 2018-12-07 LAB — HEMOGLOBIN A1C: Hgb A1c MFr Bld: 8.1 % — ABNORMAL HIGH (ref 4.6–6.5)

## 2018-12-07 NOTE — Patient Instructions (Signed)
Please complete lab work prior to leaving.   

## 2018-12-07 NOTE — Progress Notes (Signed)
Subjective:    Patient ID: Molly May, female    DOB: [birth year], 58 y.o.   MRN: [id number]  HPI  Patient is a 58 yr old female who presents today for follow up.  Patient reports that she plans to travel to [place] in October  DM2- he reports that her diet has been OK.   Lab Results  Component Value Date   HGBA1C 7.0 (H) 04/26/2018   HGBA1C 6.6 (H) 01/22/2018   HGBA1C 6.5 09/14/2017   Lab Results  Component Value Date   MICROALBUR <0.7 07/09/2015   LDLCALC 59 06/10/2017   CREATININE 0.77 04/26/2018   GERD- stable on PPI.  Hyperlipidemia- maintained on livalo.    She thinks she may have had covid last month and is interested in antibody testing.   Review of Systems See HPI  Past Medical History:  Diagnosis Date  . Allergy   . Anxiety   . Anxiety disorder 02/19/2011  . 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 Depression   . Diabetes mellitus   . GERD (gastroesophageal reflux disease)   . Heart murmur   . Hyperlipidemia   . Hypertension   . Iron deficiency   . Kidney stone   . Migraine   . Mitral valve prolapse 02/19/2011  . Sleep apnea 02/19/2011  . Stomach ulcer 10yr of age     Social History   Socioeconomic History  . Marital status: Single    Spouse name: Not on file  . Number of children: Not on file  . Years of education: Not on file  . Highest education level: Not on file  Occupational History  . Not on file  Social Needs  . Financial resource strain: Not on file  . Food insecurity    Worry: Not on file    Inability: Not on file  . Transportation needs    Medical: Not on file    Non-medical: Not on file  Tobacco Use  . Smoking status: Never Smoker  . Smokeless tobacco: Never Used  Substance and Sexual Activity  . Alcohol use: No  . Drug use: No  . Sexual activity: Not on file  Lifestyle  . Physical activity    Days per week: Not on file    Minutes per session: Not on file  . Stress: Not on file  Relationships  . Social cProduct manageron phone: Not on file    Gets together: Not on file    Attends religious service: Not on file    Active member of club or organization: Not on file    Attends meetings of clubs or organizations: Not on file    Relationship status: Not on file  . Intimate partner violence    Fear of current or ex partner: Not on file    Emotionally abused: Not on file    Physically abused: Not on file    Forced sexual activity: Not on file  Other Topics Concern  . Not on file  Social History Narrative   Regular exercise:  Yes. 3-5 x weekly   Caffeine Use:  No   Married- lives with husband, 2 children (age 1235and 836.   Disabled- due to deafness and overall work. Couldn't keep up.  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          Past Surgical History:  Procedure Laterality Date  . ABDOMINAL HYSTERECTOMY  2000  . APPENDECTOMY  1986  . BREAST SURGERY  2011   biposy--benign  . CATARACT EXTRACTION Right 10/13/11  . CATARACT EXTRACTION Left 03/2014  . CHOLECYSTECTOMY  2011  . OVARIAN CYST REMOVAL  1986, 1988  . TONSILLECTOMY AND ADENOIDECTOMY  1973  . UPPER GASTROINTESTINAL ENDOSCOPY  2009   Gets them every 3 years    Family History  Problem Relation Age of Onset  . Kidney disease Mother        kidney failure  . Multiple sclerosis Mother   . Other Mother        polio  . Hyperlipidemia Father   . Heart disease Father   . Hypertension Father   . Stomach cancer Father   . Diabetes Maternal Aunt   . Heart disease Maternal Grandmother   . Hypertension Maternal Grandmother   . Diabetes Maternal Grandmother   . Heart disease Maternal Grandfather   . Hypertension Maternal Grandfather     Allergies  Allergen Reactions  . Aspirin Other (See Comments)    Stomach pains  . Codeine Other (See Comments)    Nosebleed  . Eggs Or Egg-Derived Products     Pt reports allergy to RAW EGGS. ? Stomach upset, diarrhea   . Erythromycin Nausea And Vomiting  . Fluticasone Propionate     Stomach upset  . Penicillins Hives  . Prilosec [Omeprazole Magnesium]     migraine  . Vaseline [Petrolatum] Itching  . Vicodin [Hydrocodone-Acetaminophen]     twitching  . Zetia [Ezetimibe] Diarrhea    Current Outpatient Medications on File Prior to Visit  Medication Sig Dispense Refill  . BD PEN NEEDLE NANO U/F 32G X 4 MM MISC USE AS DIRECTED 100 each 0  . blood glucose meter kit and supplies KIT Dispense based on patient and insurance preference. Use up to four times daily as directed. (FOR ICD-9 250.00, 250.01). 1 each 0  . Cholecalciferol (VITAMIN D) 50 MCG (2000 UT) tablet     . estradiol (ESTRACE) 1 MG tablet Take 1 tablet by mouth once daily 90 tablet 0  . glucose blood (ONE TOUCH ULTRA TEST) test strip Use to check blood sugar every morning. 100 each 2  . Insulin Glargine (LANTUS SOLOSTAR) 100 UNIT/ML Solostar Pen Inject 5 Units into the skin daily at 10 pm. 15 mL 5  . Iron Combinations (IRON COMPLEX PO) Take 75 mg by mouth daily.    Marland Kitchen JANUMET XR 50-500 MG TB24 Take 2 tablets by mouth once daily 180 tablet 0  . lisinopril (ZESTRIL) 2.5 MG tablet Take 1 tablet (2.5 mg total) by mouth daily. 90 tablet 0  . oxybutynin (DITROPAN-XL) 5 MG 24 hr tablet TAKE 1 TABLET BY MOUTH ONCE DAILY AT BEDTIME 90 tablet 0  . pantoprazole (PROTONIX) 40 MG tablet Take 1 tablet by mouth twice daily. 60 tablet 10  . pioglitazone (ACTOS) 30 MG tablet Take 1 tablet by mouth once daily 90 tablet 0  . Pitavastatin Calcium (LIVALO) 4 MG TABS Take 1 tablet (4 mg total) by mouth daily. 90 tablet 1   No current facility-administered medications on file prior to visit.     BP 129/70 (BP Location: Left Arm, Patient Position: Sitting, Cuff Size: Small)   Pulse 94   Temp (!) 97.5 F (36.4 C) (Oral)   Resp 16   Ht _0  (1.575 m)   Wt 125 lb 9.6 oz (57 kg)   LMP 04/28/1998   SpO2 100%   BMI 22.97 kg/m  Objective:   Physical Exam  Constitutional:      Appearance: She is well-developed.  HENT:     Head: Normocephalic and atraumatic.  Neck:     Musculoskeletal: Neck supple.     Thyroid: No thyromegaly.  Cardiovascular:     Rate and Rhythm: Normal rate and regular rhythm.     Heart sounds: Normal heart sounds. No murmur.  Pulmonary:     Effort: Pulmonary effort is normal. No respiratory distress.     Breath sounds: Normal breath sounds. No wheezing.  Skin:    General: Skin is warm and dry.  Neurological:     Mental Status: She is alert and oriented to person, place, and time.  Psychiatric:        Behavior: Behavior normal.        Thought Content: Thought content normal.        Judgment: Judgment normal.           Assessment & Plan:  DM2- Not checking sugars at home but CMA did meter teaching today.  Obtain A1C.  Hyperlipidemia- tolerating statin, obtain follow up lipid panel.   GERD- stable on PPI, continue same.   H/o viral illness- check covid IgG at pt request.

## 2018-12-08 LAB — SAR COV2 SEROLOGY (COVID19)AB(IGG),IA: SARS CoV2 AB IGG: NEGATIVE

## 2018-12-09 ENCOUNTER — Encounter: Payer: Self-pay | Admitting: Family

## 2018-12-09 ENCOUNTER — Other Ambulatory Visit: Payer: Self-pay

## 2018-12-09 MED ORDER — LANTUS SOLOSTAR 100 UNIT/ML ~~LOC~~ SOPN: 10.0000 [IU] | PEN_INJECTOR | Freq: Every day | SUBCUTANEOUS | 5 refills | Status: DC

## 2018-12-09 NOTE — Patient Outreach (Signed)
[facility]) Care Management  12/09/2018  Molly May April 25, 1961 [id number]   Medication Adherence call to Molly May patient did not answer patient is past due on Livalo 4 mg under [place].   [place] Management Direct Dial [phone]  Fax [phone] [email or web address]

## 2018-12-14 ENCOUNTER — Encounter (HOSPITAL_BASED_OUTPATIENT_CLINIC_OR_DEPARTMENT_OTHER): Payer: Self-pay

## 2018-12-14 ENCOUNTER — Ambulatory Visit (HOSPITAL_BASED_OUTPATIENT_CLINIC_OR_DEPARTMENT_OTHER)
Admission: RE | Admit: 2018-12-14 | Discharge: 2018-12-14 | Disposition: A | Discharge status: Home / Self Care | Payer: Medicare Other | Source: Ambulatory Visit | Attending: Family | Admitting: Family

## 2018-12-14 ENCOUNTER — Other Ambulatory Visit: Payer: Self-pay | Admitting: Family

## 2018-12-14 ENCOUNTER — Other Ambulatory Visit: Payer: Self-pay

## 2018-12-14 DIAGNOSIS — Z0000 Encounter for general adult medical examination without abnormal findings: Secondary | ICD-10-CM

## 2018-12-14 DIAGNOSIS — Z1231 Encounter for screening mammogram for malignant neoplasm of breast: Secondary | ICD-10-CM | POA: Diagnosis not present

## 2018-12-28 ENCOUNTER — Encounter: Payer: Self-pay | Admitting: Family

## 2018-12-29 NOTE — Telephone Encounter (Signed)
Do we have egg free flu shots? If so, please notify pt and schedule a nurse visit.

## 2019-01-01 ENCOUNTER — Other Ambulatory Visit: Payer: Self-pay

## 2019-01-01 ENCOUNTER — Ambulatory Visit (INDEPENDENT_AMBULATORY_CARE_PROVIDER_SITE_OTHER): Payer: Medicare Other

## 2019-01-01 DIAGNOSIS — Z23 Encounter for immunization: Secondary | ICD-10-CM

## 2019-01-28 ENCOUNTER — Other Ambulatory Visit: Payer: Self-pay | Admitting: Family

## 2019-03-01 ENCOUNTER — Other Ambulatory Visit: Payer: Self-pay | Admitting: Family Medicine

## 2019-03-01 ENCOUNTER — Other Ambulatory Visit: Payer: Self-pay | Admitting: Family

## 2019-03-10 ENCOUNTER — Other Ambulatory Visit: Payer: Self-pay

## 2019-03-11 ENCOUNTER — Ambulatory Visit (INDEPENDENT_AMBULATORY_CARE_PROVIDER_SITE_OTHER): Payer: Medicare Other | Admitting: Family

## 2019-03-11 ENCOUNTER — Encounter: Payer: Self-pay | Admitting: Family

## 2019-03-11 VITALS — BP 133/72 | HR 84 | Temp 96.9°F | Resp 16 | Ht 62.5 in | Wt 124.0 lb

## 2019-03-11 DIAGNOSIS — M5416 Radiculopathy, lumbar region: Secondary | ICD-10-CM | POA: Diagnosis not present

## 2019-03-11 DIAGNOSIS — E119 Type 2 diabetes mellitus without complications: Secondary | ICD-10-CM

## 2019-03-11 DIAGNOSIS — E785 Hyperlipidemia, unspecified: Secondary | ICD-10-CM | POA: Diagnosis not present

## 2019-03-11 LAB — COMPREHENSIVE METABOLIC PANEL
ALT: 9 U/L (ref 0–35)
AST: 14 U/L (ref 0–37)
Albumin: 4.3 g/dL (ref 3.5–5.2)
Alkaline Phosphatase: 51 U/L (ref 39–117)
BUN: 16 mg/dL (ref 6–23)
CO2: 28 mEq/L (ref 19–32)
Calcium: 9.5 mg/dL (ref 8.4–10.5)
Chloride: 100 mEq/L (ref 96–112)
Creatinine, Ser: 0.81 mg/dL (ref 0.40–1.20)
GFR: 73.16 mL/min (ref >60.00)
Glucose, Bld: 114 mg/dL — ABNORMAL HIGH (ref 70–99)
Potassium: 4.2 mEq/L (ref 3.5–5.1)
Sodium: 135 mEq/L (ref 135–145)
Total Bilirubin: 0.5 mg/dL (ref 0.2–1.2)
Total Protein: 7.3 g/dL (ref 6.0–8.3)

## 2019-03-11 LAB — HEMOGLOBIN A1C: Hgb A1c MFr Bld: 7.1 % — ABNORMAL HIGH (ref 4.6–6.5)

## 2019-03-11 MED ORDER — MELOXICAM 7.5 MG PO TABS: 7.5000 mg | ORAL_TABLET | Freq: Every day | ORAL | 0 refills | Status: DC

## 2019-03-11 MED ORDER — LANTUS SOLOSTAR 100 UNIT/ML ~~LOC~~ SOPN: 7.0000 [IU] | PEN_INJECTOR | Freq: Every day | SUBCUTANEOUS | 5 refills | Status: DC

## 2019-03-11 NOTE — Patient Instructions (Addendum)
Please complete lab work prior to leaving. Begin meloxicam once daily for nerve pain.  Call if symptoms worsen or if symptoms are not improved in 2 weeks. Follow up in 3 months.

## 2019-03-11 NOTE — Progress Notes (Signed)
Subjective:    Patient ID: Molly May, female    DOB: March 26, 1961, 58 y.o.   MRN: [id number]  HPI  Reports some back pain for the last several years. Notes some pain radiating into the right hip and thigh.    DM2- reports that finally her A1C has improved.  Reports that her sugar is 150 as a high. Had a reading of 40 this AM.  Lab Results  Component Value Date   HGBA1C 8.1 (H) 12/07/2018   HGBA1C 7.0 (H) 04/26/2018   HGBA1C 6.6 (H) 01/22/2018   Lab Results  Component Value Date   MICROALBUR <0.7 07/09/2015   LDLCALC 55 12/07/2018   CREATININE 0.74 12/07/2018   A sign language interpreter is present today.  Review of Systems    see HPI  Past Medical History:  Diagnosis Date  . Allergy   . Anxiety   . Anxiety disorder 02/19/2011  . 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 Depression   . Diabetes mellitus   . GERD (gastroesophageal reflux disease)   . Heart murmur   . Hyperlipidemia   . Hypertension   . Iron deficiency   . Kidney stone   . Migraine   . Mitral valve prolapse 02/19/2011  . Sleep apnea 02/19/2011  . Stomach ulcer 9yr of age     Social History   Socioeconomic History  . Marital status: Single    Spouse name: Not on file  . Number of children: Not on file  . Years of education: Not on file  . Highest education level: Not on file  Occupational History  . Not on file  Social Needs  . Financial resource strain: Not on file  . Food insecurity    Worry: Not on file    Inability: Not on file  . Transportation needs    Medical: Not on file    Non-medical: Not on file  Tobacco Use  . Smoking status: Never Smoker  . Smokeless tobacco: Never Used  Substance and Sexual Activity  . Alcohol use: No  . Drug use: No  . Sexual activity: Not on file  Lifestyle  . Physical activity    Days per week: Not on file    Minutes per session: Not on file  . Stress: Not on file  Relationships  . Social c[place]baliston phone: Not on file   Gets together: Not on file    Attends religious service: Not on file    Active member of club or organization: Not on file    Attends meetings of clubs or organizations: Not on file    Relationship status: Not on file  . Intimate partner violence    Fear of current or ex partner: Not on file    Emotionally abused: Not on file    Physically abused: Not on file    Forced sexual activity: Not on file  Other Topics Concern  . Not on file  Social History Narrative   Regular exercise:  Yes. 3-5 x weekly   Caffeine Use:  No   Married- lives with husband, 2 children (age 6653and 872.   Disabled- due to deafness and overall work. Couldn't keep up.  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          Past Surgical History:  Procedure Laterality Date  . ABDOMINAL HYSTERECTOMY  2000  . APPENDECTOMY  1986  .  BREAST BIOPSY    . BREAST SURGERY  2011   biposy--benign  . CATARACT EXTRACTION Right 10/13/11  . CATARACT EXTRACTION Left 03/2014  . CHOLECYSTECTOMY  2011  . OVARIAN CYST REMOVAL  1986, 1988  . TONSILLECTOMY AND ADENOIDECTOMY  1973  . UPPER GASTROINTESTINAL ENDOSCOPY  2009   Gets them every 3 years    Family History  Problem Relation Age of Onset  . Kidney disease Mother        kidney failure  . Multiple sclerosis Mother   . Other Mother        polio  . Hyperlipidemia Father   . Heart disease Father   . Hypertension Father   . Stomach cancer Father   . Diabetes Maternal Aunt   . Heart disease Maternal Grandmother   . Hypertension Maternal Grandmother   . Diabetes Maternal Grandmother   . Heart disease Maternal Grandfather   . Hypertension Maternal Grandfather     Allergies  Allergen Reactions  . Aspirin Other (See Comments)    Stomach pains  . Codeine Other (See Comments)    Nosebleed  . Eggs Or Egg-Derived Products     Pt reports allergy to RAW EGGS. ? Stomach upset, diarrhea  . Erythromycin  Nausea And Vomiting  . Fluticasone Propionate     Stomach upset  . Penicillins Hives  . Prilosec [Omeprazole Magnesium]     migraine  . Vaseline [Petrolatum] Itching  . Vicodin [Hydrocodone-Acetaminophen]     twitching  . Zetia [Ezetimibe] Diarrhea    Current Outpatient Medications on File Prior to Visit  Medication Sig Dispense Refill  . BD PEN NEEDLE NANO U/F 32G X 4 MM MISC USE AS DIRECTED 100 each 1  . blood glucose meter kit and supplies KIT Dispense based on patient and insurance preference. Use up to four times daily as directed. (FOR ICD-9 250.00, 250.01). 1 each 0  . Cholecalciferol (VITAMIN D) 50 MCG (2000 UT) tablet     . estradiol (ESTRACE) 1 MG tablet Take 1 tablet by mouth once daily 90 tablet 1  . Iron Combinations (IRON COMPLEX PO) Take 75 mg by mouth daily.    Marland Kitchen JANUMET XR 50-500 MG TB24 Take 2 tablets by mouth once daily 180 tablet 1  . lisinopril (ZESTRIL) 2.5 MG tablet Take 1 tablet by mouth once daily 90 tablet 1  . ONETOUCH ULTRA test strip USE 1 STRIP TO CHECK GLUCOSE UP TO 4 TIMES DAILY AS DIRECTED 100 each 1  . oxybutynin (DITROPAN-XL) 5 MG 24 hr tablet TAKE 1 TABLET BY MOUTH ONCE DAILY AT BEDTIME 90 tablet 1  . pantoprazole (PROTONIX) 40 MG tablet Take 1 tablet by mouth twice daily. 60 tablet 10  . pioglitazone (ACTOS) 30 MG tablet Take 1 tablet by mouth once daily 90 tablet 0  . Pitavastatin Calcium (LIVALO) 4 MG TABS Take 1 tablet (4 mg total) by mouth daily. 90 tablet 1   No current facility-administered medications on file prior to visit.     BP 133/72 (BP Location: Right Arm, Patient Position: Sitting, Cuff Size: Small)   Pulse 84   Temp (!) 96.9 F (36.1 C) (Temporal)   Resp 16   Ht 5' 2.5" (1.588 m)   Wt 124 lb (56.2 kg)   LMP 04/28/1998   SpO2 100%   BMI 22.32 kg/m    Objective:   Physical Exam Constitutional:      Appearance: She is well-developed.  Neck:     Musculoskeletal: Neck supple.  Thyroid: No thyromegaly.   Cardiovascular:     Rate and Rhythm: Normal rate and regular rhythm.     Heart sounds: Normal heart sounds. No murmur.  Pulmonary:     Effort: Pulmonary effort is normal. No respiratory distress.     Breath sounds: Normal breath sounds. No wheezing.  Musculoskeletal:     Cervical back: She exhibits no tenderness.     Lumbar back: She exhibits no tenderness.  Skin:    General: Skin is warm and dry.  Neurological:     Mental Status: She is alert and oriented to person, place, and time.     Deep Tendon Reflexes:     Reflex Scores:      Patellar reflexes are 2+ on the right side and 3+ on the left side. Psychiatric:        Behavior: Behavior normal.        Thought Content: Thought content normal.        Judgment: Judgment normal.           Assessment & Plan:  DM2-  Having some AM lows. Will decrease lantus from 10 units to 7 units once daily. Continue janumet and actos.   Lumbar radiculopathy- will rx with trial of meloxicam.  Hyperlipidemia- LDL at goal, continue statin.

## 2019-03-29 ENCOUNTER — Other Ambulatory Visit: Payer: Self-pay | Admitting: Family

## 2019-05-02 ENCOUNTER — Other Ambulatory Visit: Payer: Self-pay | Admitting: Family

## 2019-05-31 ENCOUNTER — Other Ambulatory Visit: Payer: Self-pay

## 2019-05-31 ENCOUNTER — Encounter: Payer: Self-pay | Admitting: Family

## 2019-05-31 MED ORDER — LANTUS SOLOSTAR 100 UNIT/ML ~~LOC~~ SOPN: 7.0000 [IU] | PEN_INJECTOR | Freq: Every day | SUBCUTANEOUS | 5 refills | Status: DC

## 2019-06-06 MED ORDER — LANTUS SOLOSTAR 100 UNIT/ML ~~LOC~~ SOPN: 7.0000 [IU] | PEN_INJECTOR | Freq: Every day | SUBCUTANEOUS | 5 refills | Status: DC

## 2019-06-07 ENCOUNTER — Encounter: Payer: Self-pay | Admitting: Family

## 2019-06-09 ENCOUNTER — Other Ambulatory Visit (INDEPENDENT_AMBULATORY_CARE_PROVIDER_SITE_OTHER): Payer: Medicare Other

## 2019-06-09 ENCOUNTER — Encounter: Payer: Self-pay | Admitting: Physician Assistant

## 2019-06-09 ENCOUNTER — Ambulatory Visit: Payer: Medicare Other | Admitting: Physician Assistant

## 2019-06-09 VITALS — BP 132/74 | HR 96 | Temp 97.6°F | Ht 61.5 in | Wt 126.5 lb

## 2019-06-09 DIAGNOSIS — K589 Irritable bowel syndrome without diarrhea: Secondary | ICD-10-CM | POA: Diagnosis not present

## 2019-06-09 DIAGNOSIS — K219 Gastro-esophageal reflux disease without esophagitis: Secondary | ICD-10-CM | POA: Diagnosis not present

## 2019-06-09 DIAGNOSIS — R109 Unspecified abdominal pain: Secondary | ICD-10-CM

## 2019-06-09 LAB — CBC WITH DIFFERENTIAL/PLATELET
Basophils Absolute: 0 10*3/uL (ref 0.0–0.1)
Basophils Relative: 0.6 % (ref 0.0–3.0)
Eosinophils Absolute: 0.2 10*3/uL (ref 0.0–0.7)
Eosinophils Relative: 3 % (ref 0.0–5.0)
HCT: 38.4 % (ref 36.0–46.0)
Hemoglobin: 12.7 g/dL (ref 12.0–15.0)
Lymphocytes Relative: 47.1 % — ABNORMAL HIGH (ref 12.0–46.0)
Lymphs Abs: 3.2 10*3/uL (ref 0.7–4.0)
MCHC: 33 g/dL (ref 30.0–36.0)
MCV: 86.9 fl (ref 78.0–100.0)
Monocytes Absolute: 0.4 10*3/uL (ref 0.1–1.0)
Monocytes Relative: 6 % (ref 3.0–12.0)
Neutro Abs: 2.9 10*3/uL (ref 1.4–7.7)
Neutrophils Relative %: 43.3 % (ref 43.0–77.0)
Platelets: 227 10*3/uL (ref 150.0–400.0)
RBC: 4.42 Mil/uL (ref 3.87–5.11)
RDW: 14.2 % (ref 11.5–15.5)
WBC: 6.7 10*3/uL (ref 4.0–10.5)

## 2019-06-09 LAB — COMPREHENSIVE METABOLIC PANEL
ALT: 14 U/L (ref 0–35)
AST: 15 U/L (ref 0–37)
Albumin: 4.3 g/dL (ref 3.5–5.2)
Alkaline Phosphatase: 51 U/L (ref 39–117)
BUN: 17 mg/dL (ref 6–23)
CO2: 30 mEq/L (ref 19–32)
Calcium: 9.6 mg/dL (ref 8.4–10.5)
Chloride: 99 mEq/L (ref 96–112)
Creatinine, Ser: 0.87 mg/dL (ref 0.40–1.20)
GFR: 67.31 mL/min (ref >60.00)
Glucose, Bld: 137 mg/dL — ABNORMAL HIGH (ref 70–99)
Potassium: 3.8 mEq/L (ref 3.5–5.1)
Sodium: 135 mEq/L (ref 135–145)
Total Bilirubin: 0.4 mg/dL (ref 0.2–1.2)
Total Protein: 7.6 g/dL (ref 6.0–8.3)

## 2019-06-09 MED ORDER — GLYCOPYRROLATE 2 MG PO TABS: 2.0000 mg | ORAL_TABLET | Freq: Two times a day (BID) | ORAL | 3 refills | Status: DC

## 2019-06-09 NOTE — Progress Notes (Signed)
Subjective:    Patient ID: Molly May, female    DOB: 04-19-1961, 59 y.o.   MRN: [id number]  HPI Ayliana is a pleasant 59 year old white female, established with Dr. Hilarie Fredrickson, who was last seen here in October 2019.  She comes in today with complaints of recent intermittent left-sided abdominal pain over the past 3 weeks which she describes as feeling like a spasm.  She says she thinks this may be IBS related.  Has not had any fever or chills, no nausea or vomiting.  Her bowel habits tend to alternate back and forth between constipation and loose stools.  She did have some diarrhea earlier this week which has since resolved.  Appetite has been okay and she is not complaining of any early satiety symptoms at present. When last seen in 2019 she was on twice daily Protonix and feeling better.  She since decided that Protonix was not working well for her and switched to Pepcid at bedtime 20 mg and thus far feels that that is controlling her symptoms. Patient has congenital deafness, history of chronic GERD, adult onset diabetes mellitus with neuropathy, osteoarthritis and family history of colon cancer in her father. Patient underwent prior work-up in 2016 for heme positive stool early satiety and iron deficiency.  EGD was normal, colonoscopy was normal and capsule endoscopy showed just 1 very small ulcer in the distal ileum nonspecific. She last had CT of the abdomen and pelvis in July 2019 showed her to be status post cholecystectomy otherwise negative study.  Gastric emptying scan October 2019 was normal. Most recent labs November 2020 with unremarkable c-Met. Patient says she has not been started on any new meds supplements etc.  Review of Systems Pertinent positive and negative review of systems were noted in the above HPI section.  All other review of systems was otherwise negative.  Outpatient Encounter Medications as of 06/09/2019  Medication Sig  . BD PEN NEEDLE NANO U/F 32G X 4 MM  MISC USE AS DIRECTED  . blood glucose meter kit and supplies KIT Dispense based on patient and insurance preference. Use up to four times daily as directed. (FOR ICD-9 250.00, 250.01).  . Cholecalciferol (VITAMIN D) 50 MCG (2000 UT) tablet   . estradiol (ESTRACE) 1 MG tablet Take 1 tablet by mouth once daily  . famotidine (PEPCID) 20 MG tablet Take 20 mg by mouth at bedtime.  . Insulin Glargine (LANTUS SOLOSTAR) 100 UNIT/ML Solostar Pen Inject 7 Units into the skin at bedtime.  . Iron Combinations (IRON COMPLEX PO) Take 75 mg by mouth daily.  Marland Kitchen JANUMET XR 50-500 MG TB24 Take 2 tablets by mouth once daily  . lisinopril (ZESTRIL) 2.5 MG tablet Take 1 tablet by mouth once daily  . LIVALO 4 MG TABS Take 1 tablet by mouth once daily  . meloxicam (MOBIC) 7.5 MG tablet Take 1 tablet (7.5 mg total) by mouth daily.  Glory Rosebush ULTRA test strip USE 1 STRIP TO CHECK GLUCOSE UP TO 4 TIMES DAILY AS DIRECTED  . oxybutynin (DITROPAN-XL) 5 MG 24 hr tablet TAKE 1 TABLET BY MOUTH ONCE DAILY AT BEDTIME  . pantoprazole (PROTONIX) 40 MG tablet Take 1 tablet by mouth twice daily.  . pioglitazone (ACTOS) 30 MG tablet Take 1 tablet by mouth once daily  . Zinc 50 MG TABS Take 1 tablet by mouth daily.  Marland Kitchen glycopyrrolate (ROBINUL) 2 MG tablet Take 1 tablet (2 mg total) by mouth 2 (two) times daily.   No facility-administered encounter  medications on file as of 06/09/2019.   Allergies  Allergen Reactions  . Aspirin Other (See Comments)    Stomach pains  . Codeine Other (See Comments)    Nosebleed  . Eggs Or Egg-Derived Products     Pt reports allergy to RAW EGGS. ? Stomach upset, diarrhea  . Erythromycin Nausea And Vomiting  . Fluticasone Propionate     Stomach upset  . Penicillins Hives  . Prilosec [Omeprazole Magnesium]     migraine  . Vaseline [Petrolatum] Itching  . Vicodin [Hydrocodone-Acetaminophen]     twitching  . Zetia [Ezetimibe] Diarrhea   Patient Active Problem List   Diagnosis Date Noted  .  Thumb injury, left, subsequent encounter 08/10/2017  . Right knee pain 08/10/2017  . Anemia, iron deficiency 04/11/2016  . Lactose intolerance 10/17/2015  . Osteoarthritis of both hands 12/27/2014  . Overactive bladder 06/19/2014  . Low back pain 05/10/2014  . Hematuria 12/23/2013  . Insomnia 05/31/2013  . Sciatica 01/28/2013  . Nocturnal leg cramps 11/03/2011  . Hyperlipidemia 05/02/2011  . 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 Irritable bowel syndrome 02/19/2011  . Mitral valve prolapse 02/19/2011  . Migraine 02/19/2011  . General medical examination 01/17/2011  . Diabetic peripheral neuropathy ([place]) 01/17/2011  . GERD (gastroesophageal reflux disease) 12/18/2010  . Diabetes type 2, controlled ([place]) 12/18/2010   Social History   Socioeconomic History  . Marital status: Single    Spouse name: Not on file  . Number of children: Not on file  . Years of education: Not on file  . Highest education level: Not on file  Occupational History  . Not on file  Tobacco Use  . Smoking status: Never Smoker  . Smokeless tobacco: Never Used  Substance and Sexual Activity  . Alcohol use: No  . Drug use: No  . Sexual activity: Not on file  Other Topics Concern  . Not on file  Social History Narrative   Regular exercise:  Yes. 3-5 x weekly   Caffeine Use:  No   Married- lives with husband, 2 children (age 17 and 41).   Disabled- due to deafness and overall work. Couldn't keep up.  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         Social Determinants of Health   Financial Resource Strain:   . Difficulty of Paying Living Expenses: Not on file  Food Insecurity:   . Worried About Charity fundraiser in the Last Year: Not on file  . Ran Out of Food in the Last Year: Not on file  Transportation Needs:   . Lack of Transportation (Medical): Not on file  . Lack of Transportation (Non-Medical): Not on file  Physical  Activity:   . Days of Exercise per Week: Not on file  . Minutes of Exercise per Session: Not on file  Stress:   . Feeling of Stress : Not on file  Social Connections:   . Frequency of Communication with Friends and Family: Not on file  . Frequency of Social Gatherings with Friends and Family: Not on file  . Attends Religious Services: Not on file  . Active Member of Clubs or Organizations: Not on file  . Attends Archivist Meetings: Not on file  . Marital Status: Not on file  Intimate Partner Violence:   . Fear of Current or Ex-Partner: Not on file  . Emotionally Abused: Not on file  .  Physically Abused: Not on file  . Sexually Abused: Not on file    Ms. Ostrum's family history includes Diabetes in her maternal aunt and maternal grandmother; Heart disease in her father, maternal grandfather, and maternal grandmother; Hyperlipidemia in her father; Hypertension in her father, maternal grandfather, and maternal grandmother; Kidney disease in her mother; Multiple sclerosis in her mother; Other in her mother; Stomach cancer in her father.      Objective:    Vitals:   06/09/19 1415  BP: 132/74  Pulse: 96  Temp: 97.6 F (36.4 C)    Physical Exam Well-developed well-nourished WF  in no acute distress.  Patient is deaf and has signed interpreter accompanying her, Weight 126, BMI 23.5  HEENT; nontraumatic normocephalic, EOMI, PER R LA, sclera anicteric. Oropharynx; not examined Neck; supple, no JVD Cardiovascular; regular rate and rhythm with S1-S2, no murmur rub or gallop Pulmonary; Clear bilaterally Abdomen; soft, no focal tenderness nondistended, no palpable mass or hepatosplenomegaly, bowel sounds are active Rectal; not done today Skin; benign exam, no jaundice rash or appreciable lesions Extremities; no clubbing cyanosis or edema skin warm and dry Neuro/Psych; alert and oriented x4, grossly nonfocal mood and affect appropriate       Assessment & Plan:   #78  59 year old white female, with congenital deafness with chronic GERD-currently well controlled on Pepcid 20 mg every afternoon. #2 recent intermittent left-sided abdominal pain cramping and spasm associated with alternating bowel habits.  I suspect this is secondary to IBS, Patient has not had diverticulosis documented on colonoscopy or previous imaging.  #3 status post cholecystectomy #4 history of iron deficiency with extensive work-up 2016 unremarkable. #4 family history of colon cancer in patient's father-she will be due for follow-up colonoscopy July 2021 #5 adult onset diabetes mellitus-insulin-dependent with neuropathy  Plan; continue Pepcid 20 mg p.o. every afternoon, she was advised that she could increase Pepcid to 20 mg twice daily as needed. CBC and c-Met today Trial of Robinul Forte 2 mg p.o. twice daily. We will plan to see her back in 4 to 6 weeks, and we also discussed need for follow-up colonoscopy summer 2021.  Patient is waiting to get her Covid vaccine and would like to be vaccinated prior to proceeding with colonoscopy.  Aniyiah Zell Genia Harold PA-C 06/09/2019   Cc: Debbrah Alar, NP

## 2019-06-09 NOTE — Patient Instructions (Signed)
If you are age 58 or older, your body mass index should be between 23-30. Your Body mass index is 23.51 kg/m. If this is out of the aforementioned range listed, please consider follow up with your Primary Care Provider.  If you are age 35 or younger, your body mass index should be between 19-25. Your Body mass index is 23.51 kg/m. If this is out of the aformentioned range listed, please consider follow up with your Primary Care Provider.   We have sent the following medications to your pharmacy for you to pick up at your convenience: Glycopyrrolate 2 mg twice daily for abdominal pain/spams/IBS.  Continue Pepcid 20 mg nightly, may be increased to twice daily if needed.

## 2019-06-14 ENCOUNTER — Ambulatory Visit: Payer: Medicare Other | Admitting: Family

## 2019-06-15 NOTE — Progress Notes (Signed)
Addendum: Reviewed and agree with assessment and management plan. Sherry Blackard M, MD  

## 2019-06-20 ENCOUNTER — Other Ambulatory Visit: Payer: Self-pay

## 2019-06-20 MED ORDER — DICYCLOMINE HCL 10 MG PO CAPS: 10.0000 mg | ORAL_CAPSULE | Freq: Two times a day (BID) | ORAL | 6 refills | Status: DC | PRN

## 2019-06-22 ENCOUNTER — Other Ambulatory Visit: Payer: Self-pay

## 2019-06-22 ENCOUNTER — Encounter: Payer: Self-pay | Admitting: Family

## 2019-06-22 ENCOUNTER — Ambulatory Visit (INDEPENDENT_AMBULATORY_CARE_PROVIDER_SITE_OTHER): Payer: Medicare Other | Admitting: Family

## 2019-06-22 VITALS — BP 146/82 | HR 98 | Temp 97.0°F | Resp 16 | Wt 124.0 lb

## 2019-06-22 DIAGNOSIS — Z1159 Encounter for screening for other viral diseases: Secondary | ICD-10-CM

## 2019-06-22 DIAGNOSIS — E119 Type 2 diabetes mellitus without complications: Secondary | ICD-10-CM

## 2019-06-22 DIAGNOSIS — E785 Hyperlipidemia, unspecified: Secondary | ICD-10-CM | POA: Diagnosis not present

## 2019-06-22 DIAGNOSIS — Z794 Long term (current) use of insulin: Secondary | ICD-10-CM | POA: Diagnosis not present

## 2019-06-22 DIAGNOSIS — Z114 Encounter for screening for human immunodeficiency virus [HIV]: Secondary | ICD-10-CM

## 2019-06-22 DIAGNOSIS — N3281 Overactive bladder: Secondary | ICD-10-CM

## 2019-06-22 DIAGNOSIS — R030 Elevated blood-pressure reading, without diagnosis of hypertension: Secondary | ICD-10-CM

## 2019-06-22 DIAGNOSIS — R197 Diarrhea, unspecified: Secondary | ICD-10-CM

## 2019-06-22 NOTE — Patient Instructions (Signed)
Please complete stool kit and return at your earliest convenience. Complete blood work prior to leaving. You should be contacted about scheduling your eye exam.  Below are two ways to schedule a Covid-19 Vaccine:  Please visit [email or web address] to register or call [phone]  Or call:  [facility] vaccine scheduling at [phone]

## 2019-06-22 NOTE — Progress Notes (Signed)
Subjective:    Patient ID: Molly May, female    DOB: 1960-12-17, 59 y.o.   MRN: [id number]  HPI  Patient is a 59 yr old female who presents today for follow up.  She is accompanied today by a sign language interpreter who assists with today's visit.  Reports that she has 3 loose bm's every AM, then will usually have no further diarrhea. She reports that she has had diarrhea like this for about 1 month.   DM2- on Ace for renal protection, lantus 7 units, actos. Reports that sugar was 180 this AM.  She took 10 units today because her sugar was high.  Lab Results  Component Value Date   HGBA1C 7.1 (H) 03/11/2019   HGBA1C 8.1 (H) 12/07/2018   HGBA1C 7.0 (H) 04/26/2018   Lab Results  Component Value Date   MICROALBUR <0.7 07/09/2015   [place] 55 12/07/2018   CREATININE 0.87 06/09/2019   Hyperlipidemia- maintained on livalo.    Overactive bladder- on ditropan XL.Reports that this is working well      Review of Systems See HPI  Past Medical History:  Diagnosis Date  . Allergy   . Anxiety   . Anxiety disorder 02/19/2011  . 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 Depression   . Diabetes mellitus   . GERD (gastroesophageal reflux disease)   . Heart murmur   . Hyperlipidemia   . Hypertension   . Iron deficiency   . Kidney stone   . Migraine   . Mitral valve prolapse 02/19/2011  . Sleep apnea 02/19/2011  . Stomach ulcer 54yr of age     Social History   Socioeconomic History  . Marital status: Single    Spouse name: Not on file  . Number of children: Not on file  . Years of education: Not on file  . Highest education level: Not on file  Occupational History  . Not on file  Tobacco Use  . Smoking status: Never Smoker  . Smokeless tobacco: Never Used  Substance and Sexual Activity  . Alcohol use: No  . Drug use: No  . Sexual activity: Not on file  Other Topics Concern  . Not on file  Social History Narrative   Regular exercise:  Yes. 3-5 x weekly   Caffeine Use:  No   Married- lives with husband, 2 children (age 8248and 847.   Disabled- due to deafness and overall work. Couldn't keep up.  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         Social Determinants of Health   Financial Resource Strain:   . Difficulty of Paying Living Expenses: Not on file  Food Insecurity:   . Worried About RCharity fundraiserin the Last Year: Not on file  . Ran Out of Food in the Last Year: Not on file  Transportation Needs:   . Lack of Transportation (Medical): Not on file  . Lack of Transportation (Non-Medical): Not on file  Physical Activity:   . Days of Exercise per Week: Not on file  . Minutes of Exercise per Session: Not on file  Stress:   . Feeling of Stress : Not on file  Social Connections:   . Frequency of Communication with Friends and Family: Not on file  . Frequency of Social Gatherings with Friends and Family: Not on file  . Attends Religious Services: Not on file  . Active  Member of Clubs or Organizations: Not on file  . Attends Archivist Meetings: Not on file  . Marital Status: Not on file  Intimate Partner Violence:   . Fear of Current or Ex-Partner: Not on file  . Emotionally Abused: Not on file  . Physically Abused: Not on file  . Sexually Abused: Not on file    Past Surgical History:  Procedure Laterality Date  . ABDOMINAL HYSTERECTOMY  2000  . APPENDECTOMY  1986  . BREAST BIOPSY    . BREAST SURGERY  2011   biposy--benign  . CATARACT EXTRACTION Right 10/13/11  . CATARACT EXTRACTION Left 03/2014  . CHOLECYSTECTOMY  2011  . OVARIAN CYST REMOVAL  1986, 1988  . TONSILLECTOMY AND ADENOIDECTOMY  1973  . UPPER GASTROINTESTINAL ENDOSCOPY  2009   Gets them every 3 years    Family History  Problem Relation Age of Onset  . Kidney disease Mother        kidney failure  . Multiple sclerosis Mother   . Other Mother        polio  .  Hyperlipidemia Father   . Heart disease Father   . Hypertension Father   . Stomach cancer Father   . Diabetes Maternal Aunt   . Heart disease Maternal Grandmother   . Hypertension Maternal Grandmother   . Diabetes Maternal Grandmother   . Heart disease Maternal Grandfather   . Hypertension Maternal Grandfather     Allergies  Allergen Reactions  . Aspirin Other (See Comments)    Stomach pains  . Codeine Other (See Comments)    Nosebleed  . Eggs Or Egg-Derived Products     Pt reports allergy to RAW EGGS. ? Stomach upset, diarrhea  . Erythromycin Nausea And Vomiting  . Fluticasone Propionate     Stomach upset  . Penicillins Hives  . Prilosec [Omeprazole Magnesium]     migraine  . Vaseline [Petrolatum] Itching  . Vicodin [Hydrocodone-Acetaminophen]     twitching  . Zetia [Ezetimibe] Diarrhea    Current Outpatient Medications on File Prior to Visit  Medication Sig Dispense Refill  . BD PEN NEEDLE NANO U/F 32G X 4 MM MISC USE AS DIRECTED 100 each 1  . blood glucose meter kit and supplies KIT Dispense based on patient and insurance preference. Use up to four times daily as directed. (FOR ICD-9 250.00, 250.01). 1 each 0  . Cholecalciferol (VITAMIN D) 50 MCG (2000 UT) tablet     . dicyclomine (BENTYL) 10 MG capsule Take 1 capsule (10 mg total) by mouth 2 (two) times daily as needed for spasms. 60 capsule 6  . estradiol (ESTRACE) 1 MG tablet Take 1 tablet by mouth once daily 90 tablet 1  . famotidine (PEPCID) 20 MG tablet Take 20 mg by mouth at bedtime.    . Insulin Glargine (LANTUS SOLOSTAR) 100 UNIT/ML Solostar Pen Inject 7 Units into the skin at bedtime. 15 mL 5  . Iron Combinations (IRON COMPLEX PO) Take 75 mg by mouth daily.    Marland Kitchen JANUMET XR 50-500 MG TB24 Take 2 tablets by mouth once daily 180 tablet 1  . lisinopril (ZESTRIL) 2.5 MG tablet Take 1 tablet by mouth once daily 90 tablet 1  . LIVALO 4 MG TABS Take 1 tablet by mouth once daily 90 tablet 0  . ONETOUCH ULTRA test  strip USE 1 STRIP TO CHECK GLUCOSE UP TO 4 TIMES DAILY AS DIRECTED 100 each 1  . oxybutynin (DITROPAN-XL) 5 MG 24 hr tablet TAKE  1 TABLET BY MOUTH ONCE DAILY AT BEDTIME 90 tablet 1  . pioglitazone (ACTOS) 30 MG tablet Take 1 tablet by mouth once daily 90 tablet 1  . Zinc 50 MG TABS Take 1 tablet by mouth daily.     No current facility-administered medications on file prior to visit.    BP (!) 146/82 (BP Location: Right Arm, Patient Position: Sitting, Cuff Size: Small)   Pulse 98   Temp (!) 97 F (36.1 C) (Temporal)   Resp 16   Wt 124 lb (56.2 kg)   LMP 04/28/1998   SpO2 100%   BMI 23.05 kg/m       Objective:   Physical Exam Constitutional:      Appearance: She is well-developed.  Neck:     Thyroid: No thyromegaly.  Cardiovascular:     Rate and Rhythm: Normal rate and regular rhythm.     Heart sounds: Normal heart sounds. No murmur.  Pulmonary:     Effort: Pulmonary effort is normal. No respiratory distress.     Breath sounds: Normal breath sounds. No wheezing.  Musculoskeletal:     Cervical back: Neck supple.  Skin:    General: Skin is warm and dry.  Neurological:     Mental Status: She is alert and oriented to person, place, and time.  Psychiatric:        Behavior: Behavior normal.        Thought Content: Thought content normal.        Judgment: Judgment normal.           Assessment & Plan:   Elevated blood pressure reading-BP is mildly elevated today.  Will monitor.  If still elevated next visit plan to increase dose of lisinopril. BP Readings from Last 3 Encounters:  06/22/19 (!) 146/82  06/09/19 132/74  03/11/19 133/72   Diabetes type 2-clinically stable.  Will obtain follow-up A1c.  Continue Lantus, Actos and Janumet.  Will refer for diabetic eye exam.  Hyperlipidemia-tolerating Livalo.  LDL is at goal.  Continue same. Lab Results  Component Value Date   CHOL 149 12/07/2018   HDL 69.20 12/07/2018   LDLCALC 55 12/07/2018   TRIG 127.0 12/07/2018    CHOLHDL 2 12/07/2018   Overactive bladder-reports symptoms stable on Ditropan XL.  Diarrhea-etiology is unclear.  We will continue to monitor as it is only occurring in the mornings and not throughout the rest of the day.

## 2019-06-23 LAB — HIV ANTIBODY (ROUTINE TESTING W REFLEX): HIV 1&2 Ab, 4th Generation: NONREACTIVE

## 2019-06-23 LAB — HEPATITIS C ANTIBODY
Hepatitis C Ab: NONREACTIVE
SIGNAL TO CUT-OFF: 0.02 (ref <1.00)

## 2019-06-23 LAB — HEMOGLOBIN A1C: Hgb A1c MFr Bld: 7.5 % — ABNORMAL HIGH (ref 4.6–6.5)

## 2019-06-24 ENCOUNTER — Other Ambulatory Visit: Payer: Self-pay

## 2019-06-24 ENCOUNTER — Other Ambulatory Visit: Payer: Medicare Other

## 2019-06-24 DIAGNOSIS — Z114 Encounter for screening for human immunodeficiency virus [HIV]: Secondary | ICD-10-CM

## 2019-06-24 NOTE — Addendum Note (Signed)
Addended by: Mervin Kung A on: 06/24/2019 01:12 PM   Modules accepted: Orders

## 2019-06-27 ENCOUNTER — Encounter: Payer: Self-pay | Admitting: Family

## 2019-06-28 ENCOUNTER — Encounter: Payer: Self-pay | Admitting: Family

## 2019-06-28 LAB — GI PROFILE, STOOL, PCR

## 2019-06-30 ENCOUNTER — Other Ambulatory Visit: Payer: Self-pay

## 2019-06-30 MED ORDER — CIPROFLOXACIN HCL 500 MG PO TABS: 500.0000 mg | ORAL_TABLET | Freq: Two times a day (BID) | ORAL | 0 refills | Status: AC

## 2019-07-01 ENCOUNTER — Encounter: Payer: Self-pay | Admitting: Family

## 2019-07-21 ENCOUNTER — Ambulatory Visit: Payer: Self-pay | Admitting: Physician Assistant

## 2019-07-28 ENCOUNTER — Other Ambulatory Visit: Payer: Self-pay | Admitting: Family

## 2019-08-05 DIAGNOSIS — S0501XA Injury of conjunctiva and corneal abrasion without foreign body, right eye, initial encounter: Secondary | ICD-10-CM | POA: Diagnosis not present

## 2019-08-10 ENCOUNTER — Encounter: Payer: Self-pay | Admitting: Physician Assistant

## 2019-08-10 ENCOUNTER — Ambulatory Visit: Payer: Medicare Other | Admitting: Physician Assistant

## 2019-08-10 VITALS — BP 124/78 | HR 97 | Temp 98.2°F | Ht 62.0 in | Wt 123.0 lb

## 2019-08-10 DIAGNOSIS — K219 Gastro-esophageal reflux disease without esophagitis: Secondary | ICD-10-CM | POA: Diagnosis not present

## 2019-08-10 DIAGNOSIS — K589 Irritable bowel syndrome without diarrhea: Secondary | ICD-10-CM

## 2019-08-10 DIAGNOSIS — Z800 Family history of malignant neoplasm of digestive organs: Secondary | ICD-10-CM

## 2019-08-10 NOTE — Progress Notes (Signed)
Subjective:    Patient ID: Molly May, female    DOB: Aug 30, 1960, 59 y.o.   MRN: [id number]  HPI  Molly May is a 59 year old white female with congenital deafness , established with Dr. Hilarie Fredrickson who comes in today for follow-up.  She was last seen on 06/09/2019 by myself.  She has history of IBS, and was having some GERD symptoms.  She was complaining of some intermittent crampy left-sided abdominal discomfort and spasm as well as irregular bowel habits. She was continued on Pepcid 20 mg every afternoon and says today that she is doing fine from a reflux standpoint and not having any active symptoms.  She has been trying to follow a closer antireflux regimen. She was given a prescription for Robinul Forte however her insurance would not cover it and she was given Bentyl instead which she could not tolerate due to sedation and stopped it.  She is not having any significant abdominal cramping or discomfort at this time.  She says her bowels are more regular and she is feeling pretty good.  She and her family all contracted Covid since she was last in here, she also had an E. coli infection I believe UTI.  Her husband had open heart surgery last week, is home and doing well. She will be due for follow-up colonoscopy in July 2021 due to family history of colon cancer in her father.  Review of Systems Pertinent positive and negative review of systems were noted in the above HPI section.  All other review of systems was otherwise negative.  Outpatient Encounter Medications as of 08/10/2019  Medication Sig  . BD PEN NEEDLE NANO U/F 32G X 4 MM MISC USE AS DIRECTED  . blood glucose meter kit and supplies KIT Dispense based on patient and insurance preference. Use up to four times daily as directed. (FOR ICD-9 250.00, 250.01).  . Cholecalciferol (VITAMIN D) 50 MCG (2000 UT) tablet   . estradiol (ESTRACE) 1 MG tablet Take 1 tablet by mouth once daily  . famotidine (PEPCID) 20 MG tablet Take 20 mg by  mouth at bedtime.  . Insulin Glargine (LANTUS SOLOSTAR) 100 UNIT/ML Solostar Pen Inject 7 Units into the skin at bedtime.  . Iron Combinations (IRON COMPLEX PO) Take 75 mg by mouth daily.  Marland Kitchen JANUMET XR 50-500 MG TB24 Take 2 tablets by mouth once daily  . lisinopril (ZESTRIL) 2.5 MG tablet Take 1 tablet by mouth once daily  . LIVALO 4 MG TABS Take 1 tablet by mouth once daily  . ONETOUCH ULTRA test strip USE 1 STRIP TO CHECK GLUCOSE UP TO 4 TIMES DAILY AS DIRECTED  . oxybutynin (DITROPAN-XL) 5 MG 24 hr tablet TAKE 1 TABLET BY MOUTH ONCE DAILY AT BEDTIME  . pioglitazone (ACTOS) 30 MG tablet Take 1 tablet by mouth once daily  . Zinc 50 MG TABS Take 1 tablet by mouth daily.  . [DISCONTINUED] dicyclomine (BENTYL) 10 MG capsule Take 1 capsule (10 mg total) by mouth 2 (two) times daily as needed for spasms.   No facility-administered encounter medications on file as of 08/10/2019.   Allergies  Allergen Reactions  . Aspirin Other (See Comments)    Stomach pains  . Codeine Other (See Comments)    Nosebleed  . Eggs Or Egg-Derived Products     Pt reports allergy to RAW EGGS. ? Stomach upset, diarrhea  . Erythromycin Nausea And Vomiting  . Fluticasone Propionate     Stomach upset  . Penicillins Hives  .  Prilosec [Omeprazole Magnesium]     migraine  . Vaseline [Petrolatum] Itching  . Vicodin [Hydrocodone-Acetaminophen]     twitching  . Zetia [Ezetimibe] Diarrhea   Patient Active Problem List   Diagnosis Date Noted  . Thumb injury, left, subsequent encounter 08/10/2017  . Right knee pain 08/10/2017  . Anemia, iron deficiency 04/11/2016  . Lactose intolerance 10/17/2015  . Osteoarthritis of both hands 12/27/2014  . Overactive bladder 06/19/2014  . Low back pain 05/10/2014  . Hematuria 12/23/2013  . Insomnia 05/31/2013  . Sciatica 01/28/2013  . Nocturnal leg cramps 11/03/2011  . Hyperlipidemia 05/02/2011  . 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 Irritable bowel syndrome 02/19/2011  .  Mitral valve prolapse 02/19/2011  . Migraine 02/19/2011  . General medical examination 01/17/2011  . Diabetic peripheral neuropathy ([place]) 01/17/2011  . GERD (gastroesophageal reflux disease) 12/18/2010  . Diabetes type 2, controlled ([place]) 12/18/2010   Social History   Socioeconomic History  . Marital status: Single    Spouse name: Not on file  . Number of children: Not on file  . Years of education: Not on file  . Highest education level: Not on file  Occupational History  . Not on file  Tobacco Use  . Smoking status: Never Smoker  . Smokeless tobacco: Never Used  Substance and Sexual Activity  . Alcohol use: Yes    Comment: Occ  . Drug use: No  . Sexual activity: Not on file  Other Topics Concern  . Not on file  Social History Narrative   Regular exercise:  Yes. 3-5 x weekly   Caffeine Use:  No   Married- lives with husband, 2 children (age 72 and 84).   Disabled- due to deafness and overall work. Couldn't keep up.  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         Social Determinants of Health   Financial Resource Strain:   . Difficulty of Paying Living Expenses:   Food Insecurity:   . Worried About Charity fundraiser in the Last Year:   . Arboriculturist in the Last Year:   Transportation Needs:   . Film/video editor (Medical):   Marland Kitchen Lack of Transportation (Non-Medical):   Physical Activity:   . Days of Exercise per Week:   . Minutes of Exercise per Session:   Stress:   . Feeling of Stress :   Social Connections:   . Frequency of Communication with Friends and Family:   . Frequency of Social Gatherings with Friends and Family:   . Attends Religious Services:   . Active Member of Clubs or Organizations:   . Attends Archivist Meetings:   Marland Kitchen Marital Status:   Intimate Partner Violence:   . Fear of Current or Ex-Partner:   . Emotionally Abused:   Marland Kitchen Physically Abused:   .  Sexually Abused:     [name] family history includes Colon cancer in her father and maternal grandmother; Diabetes in her maternal aunt and maternal grandmother; Heart disease in her father, maternal grandfather, and maternal grandmother; Hyperlipidemia in her father; Hypertension in her father, maternal grandfather, and maternal grandmother; Kidney disease in her mother; Multiple sclerosis in her mother; Other in her mother; Stomach cancer in her father.      Objective:    Vitals:   08/10/19 1002  BP: 124/78  Pulse: 97  Temp: 98.2 F (36.8 C)  Physical Exam Well-developed well-nourished WF in no acute distress.  Height, Weight, 123 BMI 22.5  Accompanied by sign interpreter  HEENT; nontraumatic normocephalic, EOMI, PE R LA, sclera anicteric.  Extremities; no clubbing cyanosis or edema skin warm and dry Neuro/Psych; alert and oriented x4, grossly nonfocal mood and affect appropriate       Assessment & Plan:   #89 59 year old white female with GERD, with recent exacerbation now resolved and doing well on Pepcid 20 mg every afternoon. #2  IBS-stable recent exacerbation resolved #3 family history of colon cancer-father-patient due for follow-up colonoscopy July 2021 #4 status post cholecystectomy 5.  Adult onset diabetes mellitus 6.  History of iron deficiency anemia with extensive work-up 2016 -.  Plan continue Pepcid 20 mg p.o. every afternoon Patient still plans to get vaccinated for Covid in the next month or so, she will call back to schedule colonoscopy for summer 2021.  Yordy Molly May Genia Harold PA-C 08/10/2019   Cc: Debbrah Alar, NP

## 2019-08-10 NOTE — Patient Instructions (Signed)
Continue Pepcid 20mg  daily at bedtime.   If you are age 57 or older, your body mass index should be between 23-30. Your Body mass index is 22.5 kg/m. If this is out of the aforementioned range listed, please consider follow up with your Primary Care Provider.  If you are age 17 or younger, your body mass index should be between 19-25. Your Body mass index is 22.5 kg/m. If this is out of the aformentioned range listed, please consider follow up with your Primary Care Provider.    It has been recommended to you by your physician that you have a(n) Colonoscopy completed. Per your request, we did not schedule the procedure(s) today. Please contact our office at [phone] should you decide to have the procedure completed. You will be scheduled for a pre-visit and procedure at that time.  Thank you for choosing me and [facility].  Amy Esterwood-PA

## 2019-08-15 NOTE — Progress Notes (Signed)
Addendum: Reviewed and agree with assessment and management plan. Damonte Frieson M, MD  

## 2019-08-16 ENCOUNTER — Encounter: Payer: Self-pay | Admitting: Family

## 2019-08-28 ENCOUNTER — Other Ambulatory Visit: Payer: Self-pay | Admitting: Family

## 2019-09-13 DIAGNOSIS — E119 Type 2 diabetes mellitus without complications: Secondary | ICD-10-CM | POA: Diagnosis not present

## 2019-09-13 DIAGNOSIS — Z961 Presence of intraocular lens: Secondary | ICD-10-CM | POA: Diagnosis not present

## 2019-09-13 DIAGNOSIS — H5213 Myopia, bilateral: Secondary | ICD-10-CM | POA: Diagnosis not present

## 2019-09-13 DIAGNOSIS — H52203 Unspecified astigmatism, bilateral: Secondary | ICD-10-CM | POA: Diagnosis not present

## 2019-09-13 DIAGNOSIS — Z7984 Long term (current) use of oral hypoglycemic drugs: Secondary | ICD-10-CM | POA: Diagnosis not present

## 2019-09-19 ENCOUNTER — Other Ambulatory Visit: Payer: Self-pay | Admitting: Family

## 2019-09-20 ENCOUNTER — Ambulatory Visit (INDEPENDENT_AMBULATORY_CARE_PROVIDER_SITE_OTHER): Payer: Medicare Other | Admitting: Family

## 2019-09-20 ENCOUNTER — Telehealth: Payer: Self-pay | Admitting: Family

## 2019-09-20 ENCOUNTER — Encounter: Payer: Self-pay | Admitting: Family

## 2019-09-20 ENCOUNTER — Other Ambulatory Visit: Payer: Self-pay

## 2019-09-20 VITALS — BP 136/74 | HR 89 | Temp 97.1°F | Resp 16 | Wt 121.0 lb

## 2019-09-20 DIAGNOSIS — E119 Type 2 diabetes mellitus without complications: Secondary | ICD-10-CM

## 2019-09-20 DIAGNOSIS — I10 Essential (primary) hypertension: Secondary | ICD-10-CM | POA: Diagnosis not present

## 2019-09-20 DIAGNOSIS — E785 Hyperlipidemia, unspecified: Secondary | ICD-10-CM | POA: Diagnosis not present

## 2019-09-20 LAB — LIPID PANEL
Cholesterol: 164 mg/dL (ref 0–200)
HDL: 72 mg/dL (ref >39.00)
LDL Cholesterol: 71 mg/dL (ref 0–99)
NonHDL: 92.17
Total CHOL/HDL Ratio: 2
Triglycerides: 104 mg/dL (ref 0.0–149.0)
VLDL: 20.8 mg/dL (ref 0.0–40.0)

## 2019-09-20 LAB — BASIC METABOLIC PANEL
BUN: 16 mg/dL (ref 6–23)
CO2: 30 mEq/L (ref 19–32)
Calcium: 9.4 mg/dL (ref 8.4–10.5)
Chloride: 98 mEq/L (ref 96–112)
Creatinine, Ser: 0.78 mg/dL (ref 0.40–1.20)
GFR: 76.27 mL/min (ref >60.00)
Glucose, Bld: 249 mg/dL — ABNORMAL HIGH (ref 70–99)
Potassium: 3.8 mEq/L (ref 3.5–5.1)
Sodium: 134 mEq/L — ABNORMAL LOW (ref 135–145)

## 2019-09-20 LAB — HEMOGLOBIN A1C: Hgb A1c MFr Bld: 7.6 % — ABNORMAL HIGH (ref 4.6–6.5)

## 2019-09-20 NOTE — Telephone Encounter (Signed)
Please call Dr. Aggie Hacker office to request copy of eye exam.

## 2019-09-20 NOTE — Telephone Encounter (Signed)
Medical records release faxed over to Dr. Severiano Gilbert

## 2019-09-20 NOTE — Patient Instructions (Signed)
Please complete lab work prior to leaving.   

## 2019-09-20 NOTE — Progress Notes (Signed)
Subjective:    Patient ID: Molly May, female    DOB: 31-Jul-1960, 59 y.o.   MRN: [id number]  HPI  Patient is a 59 yr old female who presents today for follow up. She is accompanied today by a sign language interpretor.   Reports that both hands are really numb at times.    DM2- last visit we increased insulin from 7 units to 10 units. Saw Dr. Rex Kras.  Reports sugars have been up and down. No sugars <70. She is trying to make positive changes to her diet. She is now eating gluten free bread, occasional sweets.  Lab Results  Component Value Date   HGBA1C 7.5 (H) 06/22/2019   HGBA1C 7.1 (H) 03/11/2019   HGBA1C 8.1 (H) 12/07/2018   Lab Results  Component Value Date   MICROALBUR <0.7 07/09/2015   LDLCALC 55 12/07/2018   CREATININE 0.87 06/09/2019   Hyperlipidemia-  Lab Results  Component Value Date   CHOL 149 12/07/2018   HDL 69.20 12/07/2018   LDLCALC 55 12/07/2018   TRIG 127.0 12/07/2018   CHOLHDL 2 12/07/2018    Elevated blood pressure reading-  BP Readings from Last 3 Encounters:  09/20/19 136/74  08/10/19 124/78  06/22/19 (!) 146/82   Reports that she had one night when she woke up with her heart racing in the middle of the night. She drank some water and it resolved. Has not had any further episodes. She wonders if she was having a bad dream.   Review of Systems    see HPI  Past Medical History:  Diagnosis Date  . Allergy   . Anxiety   . Anxiety disorder 02/19/2011  . 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 Depression   . Diabetes mellitus   . GERD (gastroesophageal reflux disease)   . Heart murmur   . Hyperlipidemia   . Hypertension   . Iron deficiency   . Kidney stone   . Migraine   . Mitral valve prolapse 02/19/2011  . Sleep apnea 02/19/2011  . Stomach ulcer 71yr of age     Social History   Socioeconomic History  . Marital status: Single    Spouse name: Not on file  . Number of children: Not on file  . Years of education: Not on file    . Highest education level: Not on file  Occupational History  . Not on file  Tobacco Use  . Smoking status: Never Smoker  . Smokeless tobacco: Never Used  Substance and Sexual Activity  . Alcohol use: Yes    Comment: Occ  . Drug use: No  . Sexual activity: Not on file  Other Topics Concern  . Not on file  Social History Narrative   Regular exercise:  Yes. 3-5 x weekly   Caffeine Use:  No   Married- lives with husband, 2 children (age 6374and [id number].   Disabled- due to deafness and overall work. Couldn't keep up.  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         Social Determinants of Health   Financial Resource Strain:   . Difficulty of Paying Living Expenses:   Food Insecurity:   . Worried About RCharity fundraiserin the Last Year:   . RArboriculturistin the Last Year:   Transportation Needs:   . LFilm/video editor(Medical):   .Marland KitchenLack of Transportation (Non-Medical):   Physical  Activity:   . Days of Exercise per Week:   . Minutes of Exercise per Session:   Stress:   . Feeling of Stress :   Social Connections:   . Frequency of Communication with Friends and Family:   . Frequency of Social Gatherings with Friends and Family:   . Attends Religious Services:   . Active Member of Clubs or Organizations:   . Attends Archivist Meetings:   Marland Kitchen Marital Status:   Intimate Partner Violence:   . Fear of Current or Ex-Partner:   . Emotionally Abused:   Marland Kitchen Physically Abused:   . Sexually Abused:     Past Surgical History:  Procedure Laterality Date  . ABDOMINAL HYSTERECTOMY  2000  . APPENDECTOMY  1986  . BREAST BIOPSY    . BREAST SURGERY  2011   biposy--benign  . CATARACT EXTRACTION Right 10/13/11  . CATARACT EXTRACTION Left 03/2014  . CHOLECYSTECTOMY  2011  . OVARIAN CYST REMOVAL  1986, 1988  . TONSILLECTOMY AND ADENOIDECTOMY  1973  . UPPER GASTROINTESTINAL ENDOSCOPY  2009   Gets them  every 3 years    Family History  Problem Relation Age of Onset  . Kidney disease Mother        kidney failure  . Multiple sclerosis Mother   . Other Mother        polio  . Hyperlipidemia Father   . Heart disease Father   . Hypertension Father   . Stomach cancer Father   . Colon cancer Father   . Diabetes Maternal Aunt   . Heart disease Maternal Grandmother   . Hypertension Maternal Grandmother   . Diabetes Maternal Grandmother   . Colon cancer Maternal Grandmother   . Heart disease Maternal Grandfather   . Hypertension Maternal Grandfather   . Pancreatic cancer Neg Hx   . Esophageal cancer Neg Hx     Allergies  Allergen Reactions  . Aspirin Other (See Comments)    Stomach pains  . Codeine Other (See Comments)    Nosebleed  . Eggs Or Egg-Derived Products     Pt reports allergy to RAW EGGS. ? Stomach upset, diarrhea  . Erythromycin Nausea And Vomiting  . Fluticasone Propionate     Stomach upset  . Penicillins Hives  . Prilosec [Omeprazole Magnesium]     migraine  . Vaseline [Petrolatum] Itching  . Vicodin [Hydrocodone-Acetaminophen]     twitching  . Zetia [Ezetimibe] Diarrhea    Current Outpatient Medications on File Prior to Visit  Medication Sig Dispense Refill  . BD PEN NEEDLE NANO U/F 32G X 4 MM MISC USE AS DIRECTED 100 each 1  . blood glucose meter kit and supplies KIT Dispense based on patient and insurance preference. Use up to four times daily as directed. (FOR ICD-9 250.00, 250.01). 1 each 0  . Cholecalciferol (VITAMIN D) 50 MCG (2000 UT) tablet     . estradiol (ESTRACE) 1 MG tablet Take 1 tablet by mouth once daily 90 tablet 0  . famotidine (PEPCID) 20 MG tablet Take 20 mg by mouth at bedtime.    . Insulin Glargine (LANTUS SOLOSTAR) 100 UNIT/ML Solostar Pen Inject 7 Units into the skin at bedtime. 15 mL 5  . Iron Combinations (IRON COMPLEX PO) Take 75 mg by mouth daily.    Marland Kitchen JANUMET XR 50-500 MG TB24 Take 2 tablets by mouth once daily 180 tablet 1  .  lisinopril (ZESTRIL) 2.5 MG tablet Take 1 tablet by mouth once daily 90 tablet  1  . LIVALO 4 MG TABS Take 1 tablet by mouth once daily 90 tablet 1  . ONETOUCH ULTRA test strip USE 1 STRIP TO CHECK GLUCOSE UP TO 4 TIMES DAILY AS DIRECTED 100 each 1  . oxybutynin (DITROPAN-XL) 5 MG 24 hr tablet TAKE 1 TABLET BY MOUTH ONCE DAILY AT BEDTIME 90 tablet 0  . pioglitazone (ACTOS) 30 MG tablet Take 1 tablet by mouth once daily 90 tablet 1   No current facility-administered medications on file prior to visit.    BP 136/74 (BP Location: Right Arm, Patient Position: Sitting, Cuff Size: Small)   Pulse 89   Temp (!) 97.1 F (36.2 C) (Temporal)   Resp 16   Wt 121 lb (54.9 kg)   LMP 04/28/1998   SpO2 100%   BMI 22.13 kg/m    Objective:   Physical Exam Constitutional:      Appearance: She is well-developed.  Neck:     Thyroid: No thyromegaly.  Cardiovascular:     Rate and Rhythm: Normal rate and regular rhythm.     Heart sounds: Normal heart sounds. No murmur.  Pulmonary:     Effort: Pulmonary effort is normal. No respiratory distress.     Breath sounds: Normal breath sounds. No wheezing.  Musculoskeletal:     Cervical back: Neck supple.  Skin:    General: Skin is warm and dry.  Neurological:     Mental Status: She is alert and oriented to person, place, and time.  Psychiatric:        Behavior: Behavior normal.        Thought Content: Thought content normal.        Judgment: Judgment normal.           Assessment & Plan:  HTN- follow up bp is better today. Continue low dose ACE. Monitor.  DM2- obtain follow up A1C. Continue current meds.   Hyperlipidemia- obtain follow up lipid panel. Continue livalo.   I advised the pt to let me know if she has any recurrent issues with elevated heart rate.  This visit occurred during the SARS-CoV-2 public health emergency.  Safety protocols were in place, including screening questions prior to the visit, additional usage of staff PPE, and  extensive cleaning of exam room while observing appropriate contact time as indicated for disinfecting solutions.

## 2019-09-29 ENCOUNTER — Other Ambulatory Visit: Payer: Self-pay | Admitting: Family

## 2019-10-04 ENCOUNTER — Other Ambulatory Visit: Payer: Self-pay | Admitting: Family

## 2019-10-11 ENCOUNTER — Encounter: Payer: Self-pay | Admitting: Internal Medicine

## 2019-10-11 ENCOUNTER — Ambulatory Visit (INDEPENDENT_AMBULATORY_CARE_PROVIDER_SITE_OTHER): Payer: Medicare Other | Admitting: Internal Medicine

## 2019-10-11 ENCOUNTER — Other Ambulatory Visit: Payer: Self-pay

## 2019-10-11 VITALS — BP 116/78 | HR 92 | Ht 62.0 in | Wt 123.6 lb

## 2019-10-11 DIAGNOSIS — E119 Type 2 diabetes mellitus without complications: Secondary | ICD-10-CM

## 2019-10-11 LAB — GLUCOSE, POCT (MANUAL RESULT ENTRY): POC Glucose: 186 mg/dl — AB (ref 70–99)

## 2019-10-11 MED ORDER — GLIPIZIDE 5 MG PO TABS
5.0000 mg | ORAL_TABLET | Freq: Two times a day (BID) | ORAL | 4 refills | Status: DC
Start: 2019-10-11 — End: 2019-12-21

## 2019-10-11 NOTE — Patient Instructions (Addendum)
-   Stop Lantus  - Start Glipizide 5 mg, 1 tablet before breakfast and Supper  - Continue Janumet 50- 500, TWO tablets daily  - Continue Actos 30 mg daily    - Contact us if your sugars are below 70 mg/dL      HOW TO TREAT LOW BLOOD SUGARS (Blood sugar LESS THAN 70 MG/DL)  Please follow the RULE OF 15 for the treatment of hypoglycemia treatment (when your (blood sugars are less than 70 mg/dL)    STEP 1: Take 15 grams of carbohydrates when your blood sugar is low, which includes:   3-4 GLUCOSE TABS  OR  3-4 OZ OF JUICE OR REGULAR SODA OR  ONE TUBE OF GLUCOSE GEL     STEP 2: RECHECK blood sugar in 15 MINUTES STEP 3: If your blood sugar is still low at the 15 minute recheck --> then, go back to STEP 1 and treat AGAIN with another 15 grams of carbohydrates.

## 2019-10-11 NOTE — Progress Notes (Signed)
Name: Molly May  MRN/ DOB: [id number], 05-28-1960   Age/ Sex: 59 y.o., female    PCP: Debbrah Alar, NP   Reason for Endocrinology Evaluation: Type 2 Diabetes Mellitus     Date of Initial Endocrinology Visit: 10/11/2019     PATIENT IDENTIFIER: Molly May is a 59 y.o. female with a past medical history of T2DM and Dyslipidemia  . The patient presented for initial endocrinology clinic visit on 10/11/2019 for consultative assistance with her diabetes management.    HPI: Molly May was accompanied by sign language interpreter   Diagnosed with DM in her 68's.  Prior Medications tried/Intolerance: Does not recall  Currently checking blood sugars 1 x / day Hypoglycemia episodes : yes             Symptoms: dizziness               Frequency: 2/ month  Hemoglobin A1c has ranged from 6.5% in 2019, peaking at 8.1% in 2020. Patient required assistance for hypoglycemia: no Patient has required hospitalization within the last 1 year from hyper or hypoglycemia: no  In terms of diet, the patient eats 3 meals a day, snacks between lunch and dinner. 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   Depression    Diabetes mellitus    GERD (gastroesophageal reflux disease)    Heart murmur    Hyperlipidemia    Hypertension    Iron deficiency    Kidney stone    Migraine    Mitral valve prolapse 02/19/2011   Sleep apnea 02/19/2011    Stomach ulcer 46yr of age   Past Surgical History:  Past Surgical History:  Procedure Laterality Date   ABDOMINAL HYSTERECTOMY  2000   APPENDECTOMY  1986   BREAST BIOPSY     BREAST SURGERY  2011   biposy--benign   CATARACT EXTRACTION Right 10/13/11   CATARACT EXTRACTION Left 03/2014   CHOLECYSTECTOMY  2011   OVARIAN CYST REMOVAL  1986, 1[place]  UPPER GASTROINTESTINAL ENDOSCOPY  2009   Gets them every 3 years      Social History:  reports that she has never smoked. She has never used smokeless tobacco. She reports current alcohol use. She reports that she does not use drugs. Family History:  Family History  Problem Relation Age of Onset   Kidney disease Mother        kidney failure   Multiple sclerosis Mother    Other Mother        polio   Hyperlipidemia Father    Heart disease Father    Hypertension Father    Stomach cancer Father    Colon cancer Father    Diabetes Maternal Aunt    Heart disease Maternal Grandmother    Hypertension Maternal Grandmother    Diabetes Maternal Grandmother    Colon cancer Maternal Grandmother  Heart disease Maternal Grandfather    Hypertension Maternal Grandfather    Pancreatic cancer Neg Hx    Esophageal cancer Neg Hx      HOME MEDICATIONS: Allergies as of 10/11/2019      Reactions   Aspirin Other (See Comments)   Stomach pains   Codeine Other (See Comments)   Nosebleed   Eggs Or Egg-derived Products    Pt reports allergy to RAW EGGS. ? Stomach upset, diarrhea   Erythromycin Nausea And Vomiting   Fluticasone Propionate    Stomach upset   Penicillins Hives   Prilosec [omeprazole Magnesium]    migraine   Vaseline [petrolatum] Itching   Vicodin [hydrocodone-acetaminophen]    twitching   Zetia [ezetimibe] Diarrhea      Medication List       Accurate as of October 11, 2019  8:54 AM. If you have any questions, ask your nurse or doctor.        BD Pen Needle  Nano U/F 32G X 4 MM Misc Generic drug: Insulin Pen Needle USE AS DIRECTED   blood glucose meter kit and supplies Kit Dispense based on patient and insurance preference. Use up to four times daily as directed. (FOR ICD-9 250.00, 250.01).   estradiol 1 MG tablet Commonly known as: ESTRACE Take 1 tablet by mouth once daily   famotidine 20 MG tablet Commonly known as: PEPCID Take 20 mg by mouth at bedtime.   IRON COMPLEX PO Take 75 mg by mouth daily.   Janumet XR 50-500 MG Tb24 Generic drug: SitaGLIPtin-MetFORMIN HCl Take 2 tablets by mouth once daily   Lantus SoloStar 100 UNIT/ML Solostar Pen Generic drug: insulin glargine Inject 7 Units into the skin at bedtime.   lisinopril 2.5 MG tablet Commonly known as: ZESTRIL Take 1 tablet by mouth once daily   Livalo 4 MG Tabs Generic drug: Pitavastatin Calcium Take 1 tablet by mouth once daily   OneTouch Ultra test strip Generic drug: glucose blood USE 1 STRIP TO CHECK GLUCOSE UP TO 4 TIMES DAILY AS DIRECTED   oxybutynin 5 MG 24 hr tablet Commonly known as: DITROPAN-XL TAKE 1 TABLET BY MOUTH ONCE DAILY AT BEDTIME   pioglitazone 30 MG tablet Commonly known as: ACTOS Take 1 tablet by mouth once daily   Vitamin D 50 MCG (2000 UT) tablet        ALLERGIES: Allergies  Allergen Reactions   Aspirin Other (See Comments)    Stomach pains   Codeine Other (See Comments)    Nosebleed   Eggs Or Egg-Derived Products     Pt reports allergy to RAW EGGS. ? Stomach upset, diarrhea   Erythromycin Nausea And Vomiting   Fluticasone Propionate     Stomach upset   Penicillins Hives   Prilosec [Omeprazole Magnesium]     migraine   Vaseline [Petrolatum] Itching   Vicodin [Hydrocodone-Acetaminophen]     twitching   Zetia [Ezetimibe] Diarrhea     REVIEW OF SYSTEMS: A comprehensive ROS was conducted with the patient and is negative except as per HPI and below:  Review of Systems  Gastrointestinal: Positive for abdominal  pain, constipation and diarrhea. Negative for nausea.  Musculoskeletal: Positive for joint pain.  Neurological: Negative for tingling.      OBJECTIVE:   VITAL SIGNS: BP 116/78 (BP Location: Left Arm, Patient Position: Sitting, Cuff Size: Normal)    Pulse 92    Ht '5\' 2"'  (1.575 m)    Wt 123 lb 9.6 oz (56.1 kg)  LMP 04/28/1998    SpO2 98%    BMI 22.61 kg/m    PHYSICAL EXAM:  General: Pt appears well and is in NAD  Neck: General: Supple without adenopathy or carotid bruits. Thyroid: Thyroid size normal.  No goiter or nodules appreciated.  Lungs: Clear with good BS bilat with no rales, rhonchi, or wheezes  Heart: RRR with normal S1 and S2 and no gallops; no murmurs; no rub  Abdomen: Normoactive bowel sounds, soft, nontender, without masses or organomegaly palpable  Extremities:  Lower extremities - No pretibial edema. No lesions.  Skin: Normal texture and temperature to palpation.   Neuro: MS is good with appropriate affect, pt is alert and Ox3      DATA REVIEWED:  Lab Results  Component Value Date   HGBA1C 7.6 (H) 09/20/2019   HGBA1C 7.5 (H) 06/22/2019   HGBA1C 7.1 (H) 03/11/2019   Lab Results  Component Value Date   MICROALBUR <0.7 07/09/2015   LDLCALC 71 09/20/2019   CREATININE 0.78 09/20/2019   Lab Results  Component Value Date   MICRALBCREAT 0.4 07/09/2015    Lab Results  Component Value Date   CHOL 164 09/20/2019   HDL 72.00 09/20/2019   LDLCALC 71 09/20/2019   TRIG 104.0 09/20/2019   CHOLHDL 2 09/20/2019        ASSESSMENT / PLAN / RECOMMENDATIONS:   1) Type 2 Diabetes Mellitus, Sub-optimally controlled, Without complications - Most recent A1c of  7.5%. Goal A1c < 7.0 %.    - A1c sub-optimal due to post-prandial hyperglycemia - I have counseled the pt about avoiding sugar-sweetened beverages, but she is unable to tolerate artifical sweeteners as they seem to worsen her IBS. She also doesn't believe that having a soda or sweet tea with her snack after  lunch is going to affect her glucose control.  -  Given that she reports fasting hypoglycemia with just 10 units of insulin, I am going to stop this and switch to Glipizide.  - Pt is not sure she can take the evening dose of Glipizide before she eats due to having 2 teenage kids and a husband who apparently has heart disease.    MEDICATIONS:  Stop Lantus  - Start Glipizide 5 mg, 1 tablet before breakfast and Supper  - Continue Janumet 50- 500, TWO tablets daily  - Continue Actos 30 mg daily    EDUCATION / INSTRUCTIONS:  BG monitoring instructions: Patient is instructed to check her blood sugars 2 times a day, fasting and bedtime .  Call [facility] if: BG persistently < 70   I reviewed the Rule of 15 for the treatment of hypoglycemia in detail with the patient. Literature supplied.   2) Diabetic complications:   Eye: Does not have known diabetic retinopathy.   Neuro/ Feet: Does not have known diabetic peripheral neuropathy.  Renal: Patient does not have known baseline CKD. She is  on an ACEI/ARB at present.    I spent 45 minutes preparing to see the patient by review of recent labs, imaging and procedures, obtaining and reviewing separately obtained history, communicating with the patient/family or caregiver, ordering medications, tests or procedures, and documenting clinical information in the EHR including the differential Dx, treatment, and any further evaluation and other management     F/U in 3 months   Signed electronically by: Mack Guise, MD  [facility]  [facility] [address] [place], [place] [address] Phone: [phone] FAX: [phone]  CC: Debbrah Alar, NP [address] [place] [address] Phone: [phone]  Fax: [phone]    Return to Endocrinology clinic as below: Future Appointments  Date Time Provider [place]  12/21/2019  9:40 AM  Debbrah Alar, NP [facility]

## 2019-10-27 ENCOUNTER — Other Ambulatory Visit: Payer: Self-pay | Admitting: Family

## 2019-11-28 ENCOUNTER — Other Ambulatory Visit (HOSPITAL_BASED_OUTPATIENT_CLINIC_OR_DEPARTMENT_OTHER): Payer: Self-pay | Admitting: Family

## 2019-11-28 DIAGNOSIS — Z1231 Encounter for screening mammogram for malignant neoplasm of breast: Secondary | ICD-10-CM

## 2019-11-30 ENCOUNTER — Encounter: Payer: Self-pay | Admitting: Internal Medicine

## 2019-12-13 ENCOUNTER — Other Ambulatory Visit: Payer: Self-pay | Admitting: Family

## 2019-12-19 ENCOUNTER — Other Ambulatory Visit: Payer: Self-pay | Admitting: Family

## 2019-12-21 ENCOUNTER — Ambulatory Visit (INDEPENDENT_AMBULATORY_CARE_PROVIDER_SITE_OTHER): Payer: Medicare Other | Admitting: Family

## 2019-12-21 ENCOUNTER — Other Ambulatory Visit: Payer: Self-pay

## 2019-12-21 VITALS — BP 138/75 | HR 100 | Temp 98.7°F | Resp 16 | Ht 62.0 in | Wt 125.0 lb

## 2019-12-21 DIAGNOSIS — E785 Hyperlipidemia, unspecified: Secondary | ICD-10-CM

## 2019-12-21 DIAGNOSIS — G4700 Insomnia, unspecified: Secondary | ICD-10-CM

## 2019-12-21 DIAGNOSIS — E119 Type 2 diabetes mellitus without complications: Secondary | ICD-10-CM

## 2019-12-21 DIAGNOSIS — K219 Gastro-esophageal reflux disease without esophagitis: Secondary | ICD-10-CM

## 2019-12-21 NOTE — Progress Notes (Signed)
Subjective:    Patient ID: Molly May, female    DOB: 05/18/1960, 59 y.o.   MRN: [id number]  HPI  Patient is a 59 yr old female who presents today for follow up. She is accompanied today by a sign language interpretor.   DM2- reports AM Hypoglycemia.    Reports that she waking up drenched at 4AM- sugar has been 63, 55, last night was 44.  She takes glucose tabs to bring the sugar up. This AM was 73.She has stopped glipizide.  Current regimen includes: Lantus 12 units SQ daily Janumet XR 50-500 Actos 67m.   Lab Results  Component Value Date   HGBA1C 7.6 (H) 09/20/2019    Hyperlipidemia- maintained on livalo.  Lab Results  Component Value Date   CHOL 164 09/20/2019   HDL 72.00 09/20/2019   LDLCALC 71 09/20/2019   TRIG 104.0 09/20/2019   CHOLHDL 2 09/20/2019   She is currently on lisinopril 2.5 mg.  BP Readings from Last 3 Encounters:  12/21/19 138/75  10/11/19 116/78  09/20/19 136/74   GERD- she is using pepcid once daily in the AM and this is controlling her symptoms.   OAB- reports no symptoms on ditropan-XL.   Hyperlipidemia- Continues livalo. Lab Results  Component Value Date   CHOL 164 09/20/2019   HDL 72.00 09/20/2019   LDLCALC 71 09/20/2019   TRIG 104.0 09/20/2019   CHOLHDL 2 09/20/2019      Review of Systems    see HPI  Past Medical History:  Diagnosis Date  . Allergy   . Anxiety   . Anxiety disorder 02/19/2011  . 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 Depression   . Diabetes mellitus   . GERD (gastroesophageal reflux disease)   . Heart murmur   . Hyperlipidemia   . Hypertension   . Iron deficiency   . Kidney stone   . Migraine   . Mitral valve prolapse 02/19/2011  . Sleep apnea 02/19/2011  . Stomach ulcer 89yrof age     Social History   Socioeconomic History  . Marital status: Single    Spouse name: Not on file  . Number of children: Not on file  . Years of education: Not on file  . Highest education level: Not on file    Occupational History  . Not on file  Tobacco Use  . Smoking status: Never Smoker  . Smokeless tobacco: Never Used  Vaping Use  . Vaping Use: Never used  Substance and Sexual Activity  . Alcohol use: Yes    Comment: Occ  . Drug use: No  . Sexual activity: Not on file  Other Topics Concern  . Not on file  Social History Narrative   Regular exercise:  Yes. 3-5 x weekly   Caffeine Use:  No   Married- lives with husband, 2 children (age 52 [phone] [phone]   Disabled- due to deafness and overall work. Couldn't keep up.  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         Social Determinants of Health   Financial Resource Strain:   . Difficulty of Paying Living Expenses: Not on file  Food Insecurity:   . Worried About RuCharity fundraisern the Last Year: Not on file  . Ran Out of Food in the Last Year: Not on file  Transportation Needs:   . Lack of Transportation (Medical): Not on file  .  Lack of Transportation (Non-Medical): Not on file  Physical Activity:   . Days of Exercise per Week: Not on file  . Minutes of Exercise per Session: Not on file  Stress:   . Feeling of Stress : Not on file  Social Connections:   . Frequency of Communication with Friends and Family: Not on file  . Frequency of Social Gatherings with Friends and Family: Not on file  . Attends Religious Services: Not on file  . Active Member of Clubs or Organizations: Not on file  . Attends Archivist Meetings: Not on file  . Marital Status: Not on file  Intimate Partner Violence:   . Fear of Current or Ex-Partner: Not on file  . Emotionally Abused: Not on file  . Physically Abused: Not on file  . Sexually Abused: Not on file    Past Surgical History:  Procedure Laterality Date  . ABDOMINAL HYSTERECTOMY  2000  . APPENDECTOMY  1986  . BREAST BIOPSY    . BREAST SURGERY  2011   biposy--benign  . CATARACT EXTRACTION Right 10/13/11   . CATARACT EXTRACTION Left 03/2014  . CHOLECYSTECTOMY  2011  . OVARIAN CYST REMOVAL  1986, 1988  . TONSILLECTOMY AND ADENOIDECTOMY  1973  . UPPER GASTROINTESTINAL ENDOSCOPY  2009   Gets them every 3 years    Family History  Problem Relation Age of Onset  . Kidney disease Mother        kidney failure  . Multiple sclerosis Mother   . Other Mother        polio  . Hyperlipidemia Father   . Heart disease Father   . Hypertension Father   . Stomach cancer Father   . Colon cancer Father   . Diabetes Maternal Aunt   . Heart disease Maternal Grandmother   . Hypertension Maternal Grandmother   . Diabetes Maternal Grandmother   . Colon cancer Maternal Grandmother   . Heart disease Maternal Grandfather   . Hypertension Maternal Grandfather   . Pancreatic cancer Neg Hx   . Esophageal cancer Neg Hx     Allergies  Allergen Reactions  . Aspirin Other (See Comments)    Stomach pains  . Codeine Other (See Comments)    Nosebleed  . Eggs Or Egg-Derived Products     Pt reports allergy to RAW EGGS. ? Stomach upset, diarrhea  . Erythromycin Nausea And Vomiting  . Fluticasone Propionate     Stomach upset  . Penicillins Hives  . Prilosec [Omeprazole Magnesium]     migraine  . Vaseline [Petrolatum] Itching  . Vicodin [Hydrocodone-Acetaminophen]     twitching  . Zetia [Ezetimibe] Diarrhea    Current Outpatient Medications on File Prior to Visit  Medication Sig Dispense Refill  . BD PEN NEEDLE NANO U/F 32G X 4 MM MISC USE AS DIRECTED 100 each 1  . blood glucose meter kit and supplies KIT Dispense based on patient and insurance preference. Use up to four times daily as directed. (FOR ICD-9 250.00, 250.01). 1 each 0  . Cholecalciferol (VITAMIN D) 50 MCG (2000 UT) tablet     . estradiol (ESTRACE) 1 MG tablet Take 1 tablet by mouth once daily 90 tablet 0  . famotidine (PEPCID) 20 MG tablet Take 20 mg by mouth at bedtime.    . Iron Combinations (IRON COMPLEX PO) Take 75 mg by mouth daily.     Marland Kitchen JANUMET XR 50-500 MG TB24 Take 2 tablets by mouth once daily 180 tablet 0  .  lisinopril (ZESTRIL) 2.5 MG tablet Take 1 tablet by mouth once daily 90 tablet 1  . LIVALO 4 MG TABS Take 1 tablet by mouth once daily 90 tablet 0  . ONETOUCH ULTRA test strip USE 1 STRIP TO CHECK GLUCOSE UP TO 4 TIMES DAILY AS DIRECTED 100 each 1  . oxybutynin (DITROPAN-XL) 5 MG 24 hr tablet TAKE 1 TABLET BY MOUTH ONCE DAILY AT BEDTIME 90 tablet 0  . pioglitazone (ACTOS) 30 MG tablet Take 1 tablet by mouth once daily 90 tablet 0   No current facility-administered medications on file prior to visit.    BP 138/75   Pulse 100   Temp 98.7 F (37.1 C) (Oral)   Resp 16   Ht '5\' 2"'  (1.575 m)   Wt 125 lb (56.7 kg)   LMP 04/28/1998   SpO2 100%   BMI 22.86 kg/m    Objective:   Physical Exam Constitutional:      Appearance: She is well-developed.  Neck:     Thyroid: No thyromegaly.  Cardiovascular:     Rate and Rhythm: Normal rate and regular rhythm.     Heart sounds: Normal heart sounds. No murmur heard.   Pulmonary:     Effort: Pulmonary effort is normal. No respiratory distress.     Breath sounds: Normal breath sounds. No wheezing.  Musculoskeletal:     Cervical back: Neck supple.  Skin:    General: Skin is warm and dry.  Neurological:     Mental Status: She is alert and oriented to person, place, and time.  Psychiatric:        Behavior: Behavior normal.        Thought Content: Thought content normal.        Judgment: Judgment normal.           Assessment & Plan:  DM2- uncontrolled. Reviewed plan with Dr. Kelton Pillar.  Pt is advised as follows:  Please remain off of glipizide. Decrease your lantus from 12 units to 10units. If you continue to have AM sugars <70 you can decrease lantus down to 8 units. Keep your upcoming appointment with Dr. Kelton Pillar.  Hyperlipidemia- lipids at goal. Continue Livalo.  GERD- stable on pepcid. Continue same.  OAB- stable on ditropan xl.   This visit  occurred during the SARS-CoV-2 public health emergency.  Safety protocols were in place, including screening questions prior to the visit, additional usage of staff PPE, and extensive cleaning of exam room while observing appropriate contact time as indicated for disinfecting solutions.

## 2019-12-21 NOTE — Patient Instructions (Signed)
Please remain off of glipizide. Decrease your lantus from 12 units to 10units. If you continue to have AM sugars <70 you can decrease lantus down to 8 units. Keep your upcoming appointment with Dr. Lonzo Cloud.

## 2019-12-26 ENCOUNTER — Other Ambulatory Visit: Payer: Self-pay | Admitting: Family

## 2020-01-08 ENCOUNTER — Other Ambulatory Visit: Payer: Self-pay | Admitting: Family

## 2020-01-10 ENCOUNTER — Other Ambulatory Visit: Payer: Self-pay

## 2020-01-10 ENCOUNTER — Ambulatory Visit (HOSPITAL_BASED_OUTPATIENT_CLINIC_OR_DEPARTMENT_OTHER)
Admission: RE | Admit: 2020-01-10 | Discharge: 2020-01-10 | Disposition: A | Discharge status: Home / Self Care | Payer: Medicare Other | Source: Ambulatory Visit | Attending: Family | Admitting: Family

## 2020-01-10 DIAGNOSIS — Z1231 Encounter for screening mammogram for malignant neoplasm of breast: Secondary | ICD-10-CM | POA: Diagnosis not present

## 2020-01-17 ENCOUNTER — Ambulatory Visit (INDEPENDENT_AMBULATORY_CARE_PROVIDER_SITE_OTHER): Payer: Medicare Other | Admitting: Internal Medicine

## 2020-01-17 ENCOUNTER — Encounter: Payer: Self-pay | Admitting: Internal Medicine

## 2020-01-17 ENCOUNTER — Other Ambulatory Visit: Payer: Self-pay

## 2020-01-17 VITALS — HR 100 | Ht 62.0 in | Wt 123.2 lb

## 2020-01-17 DIAGNOSIS — E119 Type 2 diabetes mellitus without complications: Secondary | ICD-10-CM | POA: Diagnosis not present

## 2020-01-17 LAB — POCT GLYCOSYLATED HEMOGLOBIN (HGB A1C): Hemoglobin A1C: 5.9 % — AB (ref 4.0–5.6)

## 2020-01-17 MED ORDER — BD PEN NEEDLE NANO U/F 32G X 4 MM MISC: 1.0000 | 11 refills | Status: DC

## 2020-01-17 MED ORDER — LANTUS SOLOSTAR 100 UNIT/ML ~~LOC~~ SOPN: 9.0000 [IU] | PEN_INJECTOR | Freq: Every day | SUBCUTANEOUS | 11 refills | Status: DC

## 2020-01-17 NOTE — Patient Instructions (Signed)
-   Decrease Lantus to 9 units daily  - Continue Janumet 50- 500, TWO tablets daily  - Continue Actos 30 mg daily    - The day before Colonoscopy take 4 units of Lantus , skip pills the day of Colonoscopy     HOW TO TREAT LOW BLOOD SUGARS (Blood sugar LESS THAN 70 MG/DL)  Please follow the RULE OF 15 for the treatment of hypoglycemia treatment (when your (blood sugars are less than 70 mg/dL)    STEP 1: Take 15 grams of carbohydrates when your blood sugar is low, which includes:   3-4 GLUCOSE TABS  OR  3-4 OZ OF JUICE OR REGULAR SODA OR  ONE TUBE OF GLUCOSE GEL     STEP 2: RECHECK blood sugar in 15 MINUTES STEP 3: If your blood sugar is still low at the 15 minute recheck --> then, go back to STEP 1 and treat AGAIN with another 15 grams of carbohydrates.

## 2020-01-17 NOTE — Progress Notes (Signed)
Name: Molly May  Age/ Sex: 59 y.o., female   MRN/ DOB: [id number], 06-Jun-1960     PCP: Debbrah Alar, NP   Reason for Endocrinology Evaluation: Type 2 Diabetes Mellitus  Initial Endocrine Consultative Visit: 10/11/2019    PATIENT IDENTIFIER: Molly May is a 59 y.o. female with a past medical history of T2DM and dyslipidemia. The patient has followed with Endocrinology clinic since 10/11/2019 for consultative assistance with management of her diabetes.  DIABETIC HISTORY:  Molly May was diagnosed with DM in her 53's. Does not recall prior glycemic agents. Her hemoglobin A1c has ranged from 6.5% in 2019, peaking at 8.1% in 2020.  On her initial visit to our clinic she was on Lantus, Janumet and Actos with an A1c of 7.5 %. We attempted to replace Lantus with Glipizide but she developed hyperglycemia.  SUBJECTIVE:   During the last visit (10/11/2019): A1c 7.5% We attempted to switch lantus to Glipizide but developed hyperglycemia , we continued Janumet and Actos   Today (01/17/2020): Ms. Northrup is here for a 3 month follow up on diabetes management.  She checks her blood sugars 1 times daily, preprandial to breakfast. The patient has not had hypoglycemic episodes since reducing the lantus to 10 units. Bg's were as low as 44 mg/dL.     HOME DIABETES REGIMEN:  Lantus 10 units daily  Janumet 50-500 Two tabs daily  Actos 30 mg daily     Statin: No ACE-I/ARB: yes   METER DOWNLOAD SUMMARY: Date range evaluated: 8/23-9/21/2021 Average Number Tests/Day = 0.9 Overall Mean FS Glucose = 82 Standard Deviation = 12  BG Ranges: Low = 44 High = 103   Hypoglycemic Events/30 Days: BG < 50 = 0 Episodes of symptomatic severe hypoglycemia = 0    DIABETIC COMPLICATIONS: Microvascular complications:    Denies: CKD, retinopathy , neuropathy   Last Eye Exam: Completed 08/2019  Macrovascular complications:    Denies: CAD, CVA, PVD   HISTORY:  Past Medical  History:  Past Medical History:  Diagnosis Date  . Allergy   . Anxiety   . Anxiety disorder 02/19/2011  . 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 Depression   . Diabetes mellitus   . GERD (gastroesophageal reflux disease)   . Heart murmur   . Hyperlipidemia   . Hypertension   . Iron deficiency   . Kidney stone   . Migraine   . Mitral valve prolapse 02/19/2011  . Sleep apnea 02/19/2011  . Stomach ulcer 89yr of age   Past Surgical History:  Past Surgical History:  Procedure Laterality Date  . ABDOMINAL HYSTERECTOMY  2000  . APPENDECTOMY  1986  . BREAST BIOPSY    . BREAST SURGERY  2011   biposy--benign  . CATARACT EXTRACTION Right 10/13/11  . CATARACT EXTRACTION Left 03/2014  . CHOLECYSTECTOMY  2011  . OVARIAN CYST REMOVAL  1986, 1988  . TONSILLECTOMY AND ADENOIDECTOMY  1973  . UPPER GASTROINTESTINAL ENDOSCOPY  2009   Gets them every 3 years    Social History:  reports that she has never smoked. She has never used smokeless tobacco. She reports current alcohol use. She reports that she does not use drugs. Family History:  Family History  Problem Relation Age of Onset  . Kidney disease Mother        kidney failure  . Multiple sclerosis Mother   . Other Mother        polio  . Hyperlipidemia Father   . Heart disease Father   .  Hypertension Father   . Stomach cancer Father   . Colon cancer Father   . Diabetes Maternal Aunt   . Heart disease Maternal Grandmother   . Hypertension Maternal Grandmother   . Diabetes Maternal Grandmother   . Colon cancer Maternal Grandmother   . Heart disease Maternal Grandfather   . Hypertension Maternal Grandfather   . Pancreatic cancer Neg Hx   . Esophageal cancer Neg Hx      HOME MEDICATIONS: Allergies as of 01/17/2020      Reactions   Aspirin Other (See Comments)   Stomach pains   Codeine Other (See Comments)   Nosebleed   Eggs Or Egg-derived Products    Pt reports allergy to RAW EGGS. ? Stomach upset, diarrhea   Erythromycin  Nausea And Vomiting   Fluticasone Propionate    Stomach upset   Penicillins Hives   Prilosec [omeprazole Magnesium]    migraine   Vaseline [petrolatum] Itching   Vicodin [hydrocodone-acetaminophen]    twitching   Zetia [ezetimibe] Diarrhea      Medication List       Accurate as of January 17, 2020 10:17 AM. If you have any questions, ask your nurse or doctor.        BD Pen Needle Nano U/F 32G X 4 MM Misc Generic drug: Insulin Pen Needle USE AS DIRECTED   blood glucose meter kit and supplies Kit Dispense based on patient and insurance preference. Use up to four times daily as directed. (FOR ICD-9 250.00, 250.01).   estradiol 1 MG tablet Commonly known as: ESTRACE Take 1 tablet by mouth once daily   famotidine 20 MG tablet Commonly known as: PEPCID Take 20 mg by mouth at bedtime.   IRON COMPLEX PO Take 75 mg by mouth daily.   Janumet XR 50-500 MG Tb24 Generic drug: SitaGLIPtin-MetFORMIN HCl Take 2 tablets by mouth once daily   lisinopril 2.5 MG tablet Commonly known as: ZESTRIL Take 1 tablet by mouth once daily   Livalo 4 MG Tabs Generic drug: Pitavastatin Calcium Take 1 tablet by mouth once daily   OneTouch Ultra test strip Generic drug: glucose blood USE 1 STRIP TO CHECK GLUCOSE UP TO 4 TIMES DAILY AS DIRECTED   oxybutynin 5 MG 24 hr tablet Commonly known as: DITROPAN-XL TAKE 1 TABLET BY MOUTH ONCE DAILY AT BEDTIME   pioglitazone 30 MG tablet Commonly known as: ACTOS Take 1 tablet by mouth once daily   Vitamin D 50 MCG (2000 UT) tablet        OBJECTIVE:   Vital Signs: Pulse 100   Ht '5\' 2"'  (1.575 m)   Wt 123 lb 3.2 oz (55.9 kg)   LMP 04/28/1998   SpO2 99%   BMI 22.53 kg/m   Wt Readings from Last 3 Encounters:  01/17/20 123 lb 3.2 oz (55.9 kg)  12/21/19 125 lb (56.7 kg)  10/11/19 123 lb 9.6 oz (56.1 kg)     Exam: General: Pt appears well and is in NAD  Lungs: Clear with good BS bilat with no rales, rhonchi, or wheezes  Heart: RRR  with normal S1 and S2 and no gallops; no murmurs; no rub  Abdomen: Normoactive bowel sounds, soft, nontender, without masses or organomegaly palpable  Extremities: No pretibial edema.   Neuro: MS is good with appropriate affect, pt is alert and Ox3    DM foot exam: 01/17/2020  The skin of the feet is intact without sores or ulcerations. The pedal pulses are 2+ on right and 2+  on left. The sensation is intact to a screening 5.07, 10 gram monofilament bilaterally    DATA REVIEWED:  Lab Results  Component Value Date   HGBA1C 5.9 (A) 01/17/2020   HGBA1C 7.6 (H) 09/20/2019   HGBA1C 7.5 (H) 06/22/2019   Lab Results  Component Value Date   MICROALBUR <0.7 07/09/2015   LDLCALC 71 09/20/2019   CREATININE 0.78 09/20/2019   Lab Results  Component Value Date   MICRALBCREAT 0.4 07/09/2015     Lab Results  Component Value Date   CHOL 164 09/20/2019   HDL 72.00 09/20/2019   LDLCALC 71 09/20/2019   TRIG 104.0 09/20/2019   CHOLHDL 2 09/20/2019         ASSESSMENT / PLAN / RECOMMENDATIONS:   1) Type 2 Diabetes Mellitus, optimally controlled, Without complications - Most recent A1c of 5.9%. Goal A1c < 7.0 %.    -The patient is on small amount of Lantus, we have attempted to start her on glipizide, the patient called with complaints of hyperglycemia, upon further questioning her BG's were ranging between 120-150-150 mg/DL.  She has been back on the Lantus, she seems to prefer this to the glipizide, when I initially prescribed it for her it seemed like it was going to be difficult for her to take it before a meal due to her home schedule.  -We did discuss that BG's between 120-150 mg/DL are acceptable and are not considered severely elevated.  We also discussed the definition of hypoglycemia, we did discuss that they could be fatal. -Patient tells me she did try to adjust her Lantus from 10 to 8 units daily but has noted hyperglycemia.  In review of her glucose meter today, she has been  noted with tight BG's in the 70s.  I have advised her to try and reduce it to 9 units for now. -She has an upcoming colonoscopy and I have advised her to take 50% of her Lantus dose the night prior to her colonoscopy.  She is going to be fasting that day and will not be taking oral glycemic agents the morning.      MEDICATIONS:  - Decrease Lantus to 9 units daily  - Continue Janumet 50- 500, TWO tablets daily  - Continue Actos 30 mg daily     EDUCATION / INSTRUCTIONS:  BG monitoring instructions: Patient is instructed to check her blood sugars 1 times a day.  Call [facility] if: BG persistently < 70 or > 300. . I reviewed the Rule of 15 for the treatment of hypoglycemia in detail with the patient. Literature supplied.    2) Diabetic complications:   Eye: Does not have known diabetic retinopathy.   Neuro/ Feet: Does not have known diabetic peripheral neuropathy .   Renal: Patient does not have known baseline CKD. She   is  on an ACEI/ARB at present.     F/U in 4 months   Signed electronically by: Mack Guise, MD  [facility]  [facility] [address] [place], [place] [address] Phone: [phone] FAX: [phone]   CC: Debbrah Alar, [place] [address] [place] [address] Phone: [phone]  Fax: [phone]  Return to Endocrinology clinic as below: Future Appointments  Date Time Provider [place]  01/19/2020 10:30 AM [facility] PREVISIT RM50 [facility]  02/02/2020 11:00 AM Pyrtle, Lajuan Lines, MD [facility]  06/22/2020  8:40 AM Debbrah Alar, NP [facility]

## 2020-01-19 ENCOUNTER — Ambulatory Visit (AMBULATORY_SURGERY_CENTER): Payer: Self-pay | Admitting: *Deleted

## 2020-01-19 ENCOUNTER — Other Ambulatory Visit: Payer: Self-pay

## 2020-01-19 VITALS — Ht 62.0 in | Wt 124.0 lb

## 2020-01-19 DIAGNOSIS — Z800 Family history of malignant neoplasm of digestive organs: Secondary | ICD-10-CM

## 2020-01-19 MED ORDER — PLENVU 140 G PO SOLR: 1.0000 | ORAL | 0 refills | Status: DC

## 2020-01-19 NOTE — Progress Notes (Signed)
cov vax x 2  Raw egg  Allergy but pt can eat eggs cooked in foods , cakes and pies with NO issues or soy allergy known to patient  No issues with past sedation with any surgeries or procedures no intubation problems in the past  No FH of Malignant Hyperthermia No diet pills per patient No home 02 use per patient  No blood thinners per patient  Pt states issues with constipation  Is occ but has issues with diarrhea the most  No A fib or A flutter  EMMI video to pt or via MyChart  COVID 19 guidelines implemented in PV today with Pt and RN   Plenvu sample- pt states she cannot swallow Sutab  LOT [id number]  ExP 07/2020  Sign Language interpreter Amy in PV with pt today as well as Hospital doctor, intern. All questions answered, pt encouraged to call with further questions    Due to the COVID-19 pandemic we are asking patients to follow these guidelines. Please only bring one care partner. Please be aware that your care partner may wait in the car in the parking lot or if they feel like they will be too hot to wait in the car, they may wait in the lobby on the 4th floor. All care partners are required to wear a mask the entire time (we do not have any that we can provide them), they need to practice social distancing, and we will do a Covid check for all patient's and care partners when you arrive. Also we will check their temperature and your temperature. If the care partner waits in their car they need to stay in the parking lot the entire time and we will call them on their cell phone when the patient is ready for discharge so they can bring the car to the front of the building. Also all patient's will need to wear a mask into building.

## 2020-01-20 ENCOUNTER — Encounter: Payer: Self-pay | Admitting: Internal Medicine

## 2020-01-24 ENCOUNTER — Other Ambulatory Visit: Payer: Self-pay

## 2020-01-24 MED ORDER — BD PEN NEEDLE NANO U/F 32G X 4 MM MISC: 3 refills | Status: AC

## 2020-01-26 ENCOUNTER — Other Ambulatory Visit: Payer: Self-pay | Admitting: Family

## 2020-02-02 ENCOUNTER — Encounter: Payer: Self-pay | Admitting: Internal Medicine

## 2020-02-02 ENCOUNTER — Other Ambulatory Visit: Payer: Self-pay

## 2020-02-02 ENCOUNTER — Ambulatory Visit (AMBULATORY_SURGERY_CENTER): Payer: Medicare Other | Admitting: Internal Medicine

## 2020-02-02 VITALS — BP 135/71 | HR 86 | Temp 97.0°F | Resp 14 | Ht 62.0 in | Wt 124.0 lb

## 2020-02-02 DIAGNOSIS — Z800 Family history of malignant neoplasm of digestive organs: Secondary | ICD-10-CM | POA: Diagnosis not present

## 2020-02-02 MED ORDER — SODIUM CHLORIDE 0.9 % IV SOLN: 500.0000 mL | Freq: Once | INTRAVENOUS | Status: DC

## 2020-02-02 NOTE — Patient Instructions (Signed)
YOU HAD AN ENDOSCOPIC PROCEDURE TODAY AT THE [facility]:   Refer to the procedure report that was given to you for any specific questions about what was found during the examination.  If the procedure report does not answer your questions, please call your gastroenterologist to clarify.  If you requested that your care partner not be given the details of your procedure findings, then the procedure report has been included in a sealed envelope for you to review at your convenience later.  YOU SHOULD EXPECT: Some feelings of bloating in the abdomen. Passage of more gas than usual.  Walking can help get rid of the air that was put into your GI tract during the procedure and reduce the bloating. If you had a lower endoscopy (such as a colonoscopy or flexible sigmoidoscopy) you may notice spotting of blood in your stool or on the toilet paper. If you underwent a bowel prep for your procedure, you may not have a normal bowel movement for a few days.  Please Note:  You might notice some irritation and congestion in your nose or some drainage.  This is from the oxygen used during your procedure.  There is no need for concern and it should clear up in a day or so.  SYMPTOMS TO REPORT IMMEDIATELY:   Following lower endoscopy (colonoscopy or flexible sigmoidoscopy):  Excessive amounts of blood in the stool  Significant tenderness or worsening of abdominal pains  Swelling of the abdomen that is new, acute  Fever of 100F or higher  For urgent or emergent issues, a gastroenterologist can be reached at any hour by calling [phone]. Do not use MyChart messaging for urgent concerns.    DIET:  We do recommend a small meal at first, but then you may proceed to your regular diet.  Drink plenty of fluids but you should avoid alcoholic beverages for 24 hours.  ACTIVITY:  You should plan to take it easy for the rest of today and you should NOT DRIVE or use heavy machinery until tomorrow (because  of the sedation medicines used during the test).    FOLLOW UP: Our staff will call the number listed on your records 48-72 hours following your procedure to check on you and address any questions or concerns that you may have regarding the information given to you following your procedure. If we do not reach you, we will leave a message.  We will attempt to reach you two times.  During this call, we will ask if you have developed any symptoms of COVID 19. If you develop any symptoms (ie: fever, flu-like symptoms, shortness of breath, cough etc.) before then, please call [phone].  If you test positive for Covid 19 in the 2 weeks post procedure, please call and report this information to us.    If any biopsies were taken you will be contacted by phone or by letter within the next 1-3 weeks.  Please call us at [phone] if you have not heard about the biopsies in 3 weeks.    SIGNATURES/CONFIDENTIALITY: You and/or your care partner have signed paperwork which will be entered into your electronic medical record.  These signatures attest to the fact that that the information above on your After Visit Summary has been reviewed and is understood.  Full responsibility of the confidentiality of this discharge information lies with you and/or your care-partner. 

## 2020-02-02 NOTE — Progress Notes (Signed)
Interpreter used today at the [facility] for this pt.  Interpreter's name is- Irving Burton. Used interpreter to review discharge instructions. Pt verbalized understanding.

## 2020-02-02 NOTE — Progress Notes (Signed)
Report to PACU, RN, vss, BBS= Clear.  

## 2020-02-02 NOTE — Op Note (Signed)
[facility] Patient Name: Ivonne Freeburg Procedure Date: 02/02/2020 10:45 AM MRN: [id number] Endoscopist: Beverley Fiedler , MD Age: 59 Referring MD:  Date of Birth: [birth year] Gender: Female Account #: [id number] Procedure:                Colonoscopy Indications:              Screening in patient at increased risk: Family                            history of 1st-degree relative with colorectal                            cancer, Last colonoscopy: July 2016 Medicines:                Monitored Anesthesia Care Procedure:                Pre-Anesthesia Assessment:                           - Prior to the procedure, a History and Physical                            was performed, and patient medications and                            allergies were reviewed. The patient's tolerance of                            previous anesthesia was also reviewed. The risks                            and benefits of the procedure and the sedation                            options and risks were discussed with the patient.                            All questions were answered, and informed consent                            was obtained. Prior Anticoagulants: The patient has                            taken no previous anticoagulant or antiplatelet                            agents. ASA Grade Assessment: II - A patient with                            mild systemic disease. After reviewing the risks                            and benefits, the patient was deemed in  satisfactory condition to undergo the procedure.                           After obtaining informed consent, the colonoscope                            was passed under direct vision. Throughout the                            procedure, the patient's blood pressure, pulse, and                            oxygen saturations were monitored continuously. The                            Colonoscope was introduced  through the anus and                            advanced to the cecum, identified by appendiceal                            orifice and ileocecal valve. The colonoscopy was                            performed without difficulty. The patient tolerated                            the procedure well. The quality of the bowel                            preparation was excellent. The ileocecal valve,                            appendiceal orifice, and rectum were photographed. Scope In: 10:50:31 AM Scope Out: 11:03:51 AM Scope Withdrawal Time: 0 hours 9 minutes 42 seconds  Total Procedure Duration: 0 hours 13 minutes 20 seconds  Findings:                 The digital rectal exam was normal.                           The entire examined colon appeared normal on direct                            and retroflexion views. Complications:            No immediate complications. Estimated Blood Loss:     Estimated blood loss: none. Impression:               - The entire examined colon is normal on direct and                            retroflexion views.                           - No specimens  collected. Recommendation:           - Patient has a contact number available for                            emergencies. The signs and symptoms of potential                            delayed complications were discussed with the                            patient. Return to normal activities tomorrow.                            Written discharge instructions were provided to the                            patient.                           - Resume previous diet.                           - Continue present medications.                           - Repeat colonoscopy in 5 years for screening                            purposes. Beverley Fiedler, MD 02/02/2020 11:05:40 AM This report has been signed electronically.

## 2020-02-02 NOTE — Progress Notes (Signed)
VS- [employer] used today at the [employer] for this pt.  Interpreter's name is- Irving Burton  Pt's states no medical or surgical changes since previsit or office visit.

## 2020-02-06 ENCOUNTER — Telehealth: Payer: Self-pay

## 2020-02-06 ENCOUNTER — Telehealth: Payer: Self-pay | Admitting: *Deleted

## 2020-02-06 NOTE — Telephone Encounter (Signed)
No answer for post procedure call back. Left message for patient to call with questions or concerns. 

## 2020-02-06 NOTE — Telephone Encounter (Signed)
LVM

## 2020-03-02 ENCOUNTER — Encounter: Payer: Self-pay | Admitting: Family

## 2020-03-05 MED ORDER — BENZONATATE 100 MG PO CAPS: 100.0000 mg | ORAL_CAPSULE | Freq: Two times a day (BID) | ORAL | 0 refills | Status: DC | PRN

## 2020-04-08 ENCOUNTER — Other Ambulatory Visit: Payer: Self-pay | Admitting: Family

## 2020-04-25 ENCOUNTER — Other Ambulatory Visit: Payer: Self-pay | Admitting: Family

## 2020-04-28 ENCOUNTER — Other Ambulatory Visit: Payer: Self-pay | Admitting: Family

## 2020-05-01 ENCOUNTER — Encounter: Payer: Self-pay | Admitting: Family

## 2020-05-01 ENCOUNTER — Other Ambulatory Visit: Payer: Self-pay

## 2020-05-01 ENCOUNTER — Ambulatory Visit (INDEPENDENT_AMBULATORY_CARE_PROVIDER_SITE_OTHER): Payer: Medicare Other | Admitting: Family

## 2020-05-01 VITALS — BP 155/81 | HR 90 | Temp 97.8°F | Resp 16 | Wt 121.0 lb

## 2020-05-01 DIAGNOSIS — R21 Rash and other nonspecific skin eruption: Secondary | ICD-10-CM

## 2020-05-01 NOTE — Progress Notes (Signed)
Subjective:    Patient ID: Molly May, female    DOB: Nov 11, 1960, 60 y.o.   MRN: [id number]  HPI  Patient is a 60 yr old female who presents today with chief complaint of rash. She is accompanied today by a sign language interpretor. Reports that before Christmas she developed neck pain, then she developed a rash beneath the left ear on her neck. She tried to put neosporin on it.  On New Years day she developed pain down the left shoulder into her arm Felt hot/painful. Husband saw a rash in that area.  Reports that yesterday symptoms seemed to improve but she wanted to get it checked out.  She is still having some pain (burning/itching).  She also has some    Review of Systems See HPI  Past Medical History:  Diagnosis Date  . Allergy   . Anemia    IDA - on iron   . Anxiety   . Anxiety disorder 02/19/2011  . Arthritis    hands   . Cataract    removed both eyes   . Clotting disorder (HCC)    past hx of clot in the ueterus 16-17 yrs old   . 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 Depression   . Diabetes mellitus   . GERD (gastroesophageal reflux disease)   . Heart murmur   . Hyperlipidemia   . Hypertension   . Iron deficiency   . Kidney stone   . Migraine   . Mitral valve prolapse 02/19/2011  . Stomach ulcer 57yr of age     Social History   Socioeconomic History  . Marital status: Single    Spouse name: Not on file  . Number of children: Not on file  . Years of education: Not on file  . Highest education level: Not on file  Occupational History  . Not on file  Tobacco Use  . Smoking status: Never Smoker  . Smokeless tobacco: Never Used  Vaping Use  . Vaping Use: Never used  Substance and Sexual Activity  . Alcohol use: Yes    Comment: Occ  . Drug use: No  . Sexual activity: Not on file  Other Topics Concern  . Not on file  Social History Narrative   Regular exercise:  Yes. 3-5 x weekly   Caffeine Use:  No   Married- lives with husband, 2 children (age  8858and 856.   Disabled- due to deafness and overall work. Couldn't keep up.  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         Social Determinants of Health   Financial Resource Strain: Not on file  Food Insecurity: Not on file  Transportation Needs: Not on file  Physical Activity: Not on file  Stress: Not on file  Social Connections: Not on file  Intimate Partner Violence: Not on file    Past Surgical History:  Procedure Laterality Date  . ABDOMINAL HYSTERECTOMY  2000  . APPENDECTOMY  1986  . BREAST BIOPSY    . BREAST SURGERY  2011   biposy--benign  . CARDIAC CATHETERIZATION    . CATARACT EXTRACTION Right 10/13/11  . CATARACT EXTRACTION Left 03/2014  . CHOLECYSTECTOMY  2011  . COLONOSCOPY    . OVARIAN CYST REMOVAL  1986, 1988  . POLYPECTOMY    . TONSILLECTOMY AND ADENOIDECTOMY  1973  . UPPER GASTROINTESTINAL ENDOSCOPY  2009   Gets them every 3  years    Family History  Problem Relation Age of Onset  . Kidney disease Mother        kidney failure  . Multiple sclerosis Mother   . Other Mother        polio  . Hyperlipidemia Father   . Heart disease Father   . Hypertension Father   . Stomach cancer Father   . Colon cancer Father   . Diabetes Maternal Aunt   . Heart disease Maternal Grandmother   . Hypertension Maternal Grandmother   . Diabetes Maternal Grandmother   . Colon cancer Maternal Grandmother   . Heart disease Maternal Grandfather   . Hypertension Maternal Grandfather   . Pancreatic cancer Neg Hx   . Esophageal cancer Neg Hx   . Colon polyps Neg Hx   . Rectal cancer Neg Hx     Allergies  Allergen Reactions  . Aspirin Other (See Comments)    Stomach pains  . Codeine Other (See Comments)    Nosebleed  . Eggs Or Egg-Derived Products     Pt reports allergy to RAW EGGS. ? Stomach upset, diarrhea  . Erythromycin Nausea And Vomiting  . Fluticasone Propionate     Stomach upset  .  Penicillins Hives  . Prilosec [Omeprazole Magnesium]     migraine  . Vaseline [Petrolatum] Itching  . Vicodin [Hydrocodone-Acetaminophen]     twitching  . Zetia [Ezetimibe] Diarrhea    Current Outpatient Medications on File Prior to Visit  Medication Sig Dispense Refill  . blood glucose meter kit and supplies KIT Dispense based on patient and insurance preference. Use up to four times daily as directed. (FOR ICD-9 250.00, 250.01). 1 each 0  . estradiol (ESTRACE) 1 MG tablet Take 1 tablet (1 mg total) by mouth daily. 90 tablet 3  . famotidine (PEPCID) 20 MG tablet Take 20 mg by mouth at bedtime.    . insulin glargine (LANTUS SOLOSTAR) 100 UNIT/ML Solostar Pen Inject 9 Units into the skin daily. 15 mL 11  . Insulin Pen Needle (BD PEN NEEDLE NANO U/F) 32G X 4 MM MISC Use 4 times daily as directed 375 each 3  . Iron Combinations (IRON COMPLEX PO) Take 75 mg by mouth daily.    Marland Kitchen JANUMET XR 50-500 MG TB24 Take 2 tablets by mouth once daily 180 tablet 1  . lisinopril (ZESTRIL) 2.5 MG tablet Take 1 tablet by mouth once daily 90 tablet 1  . LIVALO 4 MG TABS Take 1 tablet by mouth once daily 90 tablet 0  . ONETOUCH ULTRA test strip USE 1 STRIP TO CHECK GLUCOSE UP TO 4 TIMES DAILY AS DIRECTED 100 each 1  . oxybutynin (DITROPAN-XL) 5 MG 24 hr tablet TAKE 1 TABLET BY MOUTH ONCE DAILY AT BEDTIME 90 tablet 1  . pioglitazone (ACTOS) 30 MG tablet Take 1 tablet by mouth once daily 90 tablet 0  . benzonatate (TESSALON) 100 MG capsule Take 1 capsule (100 mg total) by mouth 2 (two) times daily as needed for cough. 20 capsule 0  . Cholecalciferol (VITAMIN D) 50 MCG (2000 UT) tablet      No current facility-administered medications on file prior to visit.    BP (!) 155/81 (BP Location: Left Arm, Patient Position: Sitting, Cuff Size: Small)   Pulse 90   Temp 97.8 F (36.6 C) (Temporal)   Resp 16   Wt 121 lb (54.9 kg)   LMP 04/28/1998   SpO2 100%   BMI 22.13 kg/m  Objective:   Physical  Exam Constitutional:      Appearance: Normal appearance.  HENT:     Head: Normocephalic and atraumatic.  Skin:    Findings: Rash (scabbed rash beneath the left ear on neck) present.  Neurological:     Mental Status: She is alert.            Assessment & Plan:  Skin rash- suspect small area of shingles.  She is outside the window for antiviral treatment. Offered prn gabapentin for pain but she declines.  For itching recommended either benadryl HS or zyrtec 71m po daily.    20 minutes spent on today's visit.  This visit occurred during the SARS-CoV-2 public health emergency.  Safety protocols were in place, including screening questions prior to the visit, additional usage of staff PPE, and extensive cleaning of exam room while observing appropriate contact time as indicated for disinfecting solutions.

## 2020-05-01 NOTE — Patient Instructions (Signed)
You can try zyrtec 10mg  once daily for itching as needed.  Alternatively- if you are only having itching at night- you can take benadryl 25mg  once daily as neede.

## 2020-05-22 ENCOUNTER — Encounter: Payer: Self-pay | Admitting: Internal Medicine

## 2020-05-22 ENCOUNTER — Other Ambulatory Visit: Payer: Self-pay

## 2020-05-22 ENCOUNTER — Ambulatory Visit (INDEPENDENT_AMBULATORY_CARE_PROVIDER_SITE_OTHER): Payer: Medicare Other | Admitting: Internal Medicine

## 2020-05-22 VITALS — BP 160/82 | HR 88 | Ht 62.0 in | Wt 118.5 lb

## 2020-05-22 DIAGNOSIS — E119 Type 2 diabetes mellitus without complications: Secondary | ICD-10-CM | POA: Diagnosis not present

## 2020-05-22 LAB — POCT GLYCOSYLATED HEMOGLOBIN (HGB A1C): Hemoglobin A1C: 6.5 % — AB (ref 4.0–5.6)

## 2020-05-22 LAB — MICROALBUMIN / CREATININE URINE RATIO
Creatinine,U: 39.3 mg/dL
Microalb Creat Ratio: 1.8 mg/g (ref 0.0–30.0)
Microalb, Ur: <0.7 mg/dL (ref 0.0–1.9)

## 2020-05-22 MED ORDER — PIOGLITAZONE HCL 30 MG PO TABS: 30.0000 mg | ORAL_TABLET | Freq: Every day | ORAL | 3 refills | Status: AC

## 2020-05-22 MED ORDER — JANUMET XR 50-500 MG PO TB24: 2.0000 | ORAL_TABLET | Freq: Every day | ORAL | 1 refills | Status: AC

## 2020-05-22 MED ORDER — LANTUS SOLOSTAR 100 UNIT/ML ~~LOC~~ SOPN: 6.0000 [IU] | PEN_INJECTOR | Freq: Every day | SUBCUTANEOUS | 11 refills | Status: AC

## 2020-05-22 NOTE — Patient Instructions (Addendum)
-   Keep up the Good Work ! - Continue Lantus 6 units daily  - Continue Janumet 50- 500, TWO tablets daily  - Continue Actos 30 mg daily     HOW TO TREAT LOW BLOOD SUGARS (Blood sugar LESS THAN 70 MG/DL)  Please follow the RULE OF 15 for the treatment of hypoglycemia treatment (when your (blood sugars are less than 70 mg/dL)    STEP 1: Take 15 grams of carbohydrates when your blood sugar is low, which includes:   3-4 GLUCOSE TABS  OR  3-4 OZ OF JUICE OR REGULAR SODA OR  ONE TUBE OF GLUCOSE GEL     STEP 2: RECHECK blood sugar in 15 MINUTES STEP 3: If your blood sugar is still low at the 15 minute recheck --> then, go back to STEP 1 and treat AGAIN with another 15 grams of carbohydrates.

## 2020-05-22 NOTE — Progress Notes (Unsigned)
Name: Molly May  Age/ Sex: 60 y.o., female   MRN/ DOB: [id number], [birth year]     PCP: Debbrah Alar, NP   Reason for Endocrinology Evaluation: Type 2 Diabetes Mellitus  Initial Endocrine Consultative Visit: 10/11/2019    PATIENT IDENTIFIER: Molly May is a 60 y.o. female with a past medical history of T2DM and dyslipidemia. The patient has followed with Endocrinology clinic since 10/11/2019 for consultative assistance with management of her diabetes.  DIABETIC HISTORY:  Molly May was diagnosed with DM in her 64's. Does not recall prior glycemic agents. Her hemoglobin A1c has ranged from 6.5% in 2019, peaking at 8.1% in 2020.  On her initial visit to our clinic she was on Lantus, Janumet and Actos with an A1c of 7.5 %. We attempted to replace Lantus with Glipizide but she opted to continue with Insulin due to slight  hyperglycemia ( 120-150 mg/dL)    SUBJECTIVE:   During the last visit (01/17/2020): A1c 5.9 % We decreased lantus an continued Janumet and Actos       Today (05/22/2020): Molly May is here for a 4 month follow up on diabetes management.  She checks her blood sugars occasionally . She has not had any hypoglycemic episodes since her last visit here and since being on Lantus 6 units.    Has occasional diarrhea which is dependent on what she eats. She does not do well with corn.    HOME DIABETES REGIMEN:  Lantus 9 units daily - taking 6 units  Janumet 50-500 Two tabs daily  Actos 30 mg daily     Statin: No ACE-I/ARB: yes   METER DOWNLOAD SUMMARY: Date range evaluated: 1/11-1/25/2022 Average Number Tests/Day = 0.3 Overall Mean FS Glucose = 122 Standard Deviation = 17  BG Ranges: Low = 103 High = 140   Hypoglycemic Events/30 Days: BG < 50 = 0 Episodes of symptomatic severe hypoglycemia = 0    DIABETIC COMPLICATIONS: Microvascular complications:    Denies: CKD, retinopathy , neuropathy   Last Eye Exam: Completed  08/2019  Macrovascular complications:    Denies: CAD, CVA, PVD   HISTORY:  Past Medical History:  Past Medical History:  Diagnosis Date  . Allergy   . Anemia    IDA - on iron   . Anxiety   . Anxiety disorder 02/19/2011  . Arthritis    hands   . Cataract    removed both eyes   . Clotting disorder (HCC)    past hx of clot in the ueterus 16-17 yrs old   . 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 Depression   . Diabetes mellitus   . GERD (gastroesophageal reflux disease)   . Heart murmur   . Hyperlipidemia   . Hypertension   . Iron deficiency   . Kidney stone   . Migraine   . Mitral valve prolapse 02/19/2011  . Stomach ulcer 47yr of age   Past Surgical History:  Past Surgical History:  Procedure Laterality Date  . ABDOMINAL HYSTERECTOMY  2000  . APPENDECTOMY  1986  . BREAST BIOPSY    . BREAST SURGERY  2011   biposy--benign  . CARDIAC CATHETERIZATION    . CATARACT EXTRACTION Right 10/13/11  . CATARACT EXTRACTION Left 03/2014  . CHOLECYSTECTOMY  2011  . COLONOSCOPY    . OVARIAN CYST REMOVAL  1986, 1988  . POLYPECTOMY    . TONSILLECTOMY AND ADENOIDECTOMY  1973  . UPPER GASTROINTESTINAL ENDOSCOPY  2009   Gets them every 3 years  Social History:  reports that she has never smoked. She has never used smokeless tobacco. She reports current alcohol use. She reports that she does not use drugs. Family History:  Family History  Problem Relation Age of Onset  . Kidney disease Mother        kidney failure  . Multiple sclerosis Mother   . Other Mother        polio  . Hyperlipidemia Father   . Heart disease Father   . Hypertension Father   . Stomach cancer Father   . Colon cancer Father   . Diabetes Maternal Aunt   . Heart disease Maternal Grandmother   . Hypertension Maternal Grandmother   . Diabetes Maternal Grandmother   . Colon cancer Maternal Grandmother   . Heart disease Maternal Grandfather   . Hypertension Maternal Grandfather   . Pancreatic cancer Neg Hx    . Esophageal cancer Neg Hx   . Colon polyps Neg Hx   . Rectal cancer Neg Hx      HOME MEDICATIONS: Allergies as of 05/22/2020      Reactions   Aspirin Other (See Comments)   Stomach pains   Codeine Other (See Comments)   Nosebleed   Eggs Or Egg-derived Products    Pt reports allergy to RAW EGGS. ? Stomach upset, diarrhea   Erythromycin Nausea And Vomiting   Fluticasone Propionate    Stomach upset   Penicillins Hives   Prilosec [omeprazole Magnesium]    migraine   Vaseline [petrolatum] Itching   Vicodin [hydrocodone-acetaminophen]    twitching   Zetia [ezetimibe] Diarrhea      Medication List       Accurate as of May 22, 2020  9:26 AM. If you have any questions, ask your nurse or doctor.        STOP taking these medications   benzonatate 100 MG capsule Commonly known as: TESSALON Stopped by: Dorita Sciara, MD     TAKE these medications   BD Pen Needle Nano U/F 32G X 4 MM Misc Generic drug: Insulin Pen Needle Use 4 times daily as directed   blood glucose meter kit and supplies Kit Dispense based on patient and insurance preference. Use up to four times daily as directed. (FOR ICD-9 250.00, 250.01).   estradiol 1 MG tablet Commonly known as: ESTRACE Take 1 tablet (1 mg total) by mouth daily.   famotidine 20 MG tablet Commonly known as: PEPCID Take 20 mg by mouth at bedtime.   IRON COMPLEX PO Take 75 mg by mouth daily.   Janumet XR 50-500 MG Tb24 Generic drug: SitaGLIPtin-MetFORMIN May Take 2 tablets by mouth once daily   Lantus SoloStar 100 UNIT/ML Solostar Pen Generic drug: insulin glargine Inject 9 Units into the skin daily.   lisinopril 2.5 MG tablet Commonly known as: ZESTRIL Take 1 tablet by mouth once daily   Livalo 4 MG Tabs Generic drug: Pitavastatin Calcium Take 1 tablet by mouth once daily   OneTouch Ultra test strip Generic drug: glucose blood USE 1 STRIP TO CHECK GLUCOSE UP TO 4 TIMES DAILY AS DIRECTED   oxybutynin 5  MG 24 hr tablet Commonly known as: DITROPAN-XL TAKE 1 TABLET BY MOUTH ONCE DAILY AT BEDTIME   pioglitazone 30 MG tablet Commonly known as: ACTOS Take 1 tablet by mouth once daily   Vitamin D 50 MCG (2000 UT) tablet        OBJECTIVE:   Vital Signs: BP (!) 160/82   Pulse 88  Ht '5\' 2"'  (1.575 m)   Wt 118 lb 8 oz (53.8 kg)   LMP 04/28/1998   SpO2 97%   BMI 21.67 kg/m   Wt Readings from Last 3 Encounters:  05/22/20 118 lb 8 oz (53.8 kg)  05/01/20 121 lb (54.9 kg)  02/02/20 124 lb (56.2 kg)     Exam: General: Pt appears well and is in NAD  Lungs: Clear with good BS bilat with no rales, rhonchi, or wheezes  Heart: RRR with normal S1 and S2 and no gallops; no murmurs; no rub  Abdomen: Normoactive bowel sounds, soft, nontender, without masses or organomegaly palpable  Extremities: No pretibial edema.   Neuro: MS is good with appropriate affect, pt is alert and Ox3    DM foot exam: 01/17/2020  The skin of the feet is intact without sores or ulcerations. The pedal pulses are 2+ on right and 2+ on left. The sensation is intact to a screening 5.07, 10 gram monofilament bilaterally    DATA REVIEWED:  Lab Results  Component Value Date   HGBA1C 6.5 (A) 05/22/2020   HGBA1C 5.9 (A) 01/17/2020   HGBA1C 7.6 (H) 09/20/2019   Lab Results  Component Value Date   MICROALBUR <0.7 07/09/2015   LDLCALC 71 09/20/2019   CREATININE 0.78 09/20/2019   Lab Results  Component Value Date   MICRALBCREAT 0.4 07/09/2015     Lab Results  Component Value Date   CHOL 164 09/20/2019   HDL 72.00 09/20/2019   LDLCALC 71 09/20/2019   TRIG 104.0 09/20/2019   CHOLHDL 2 09/20/2019       Results for AKSHITA, ITALIANO (MRN [id number]) as of 05/23/2020 14:18  Ref. Range 05/22/2020 09:41  MICROALB/CREAT RATIO Latest Ref Range: 0.0 - 30.0 mg/g 1.8    ASSESSMENT / PLAN / RECOMMENDATIONS:   1) Type 2 Diabetes Mellitus, optimally controlled, Without complications - Most recent A1c of 6.5 %.  Goal A1c < 7.0 %.    - A1c optimal  - No more hypoglycemic episodes  - We have attempted to stop lantus and start her on Glipizide but she didn;t like BG's ranging in 120-150 and we switched back to Lantus.    MEDICATIONS: - Continue  Lantus 6 units daily  - Continue Janumet 50- 500, TWO tablets daily  - Continue Actos 30 mg daily     EDUCATION / INSTRUCTIONS:  BG monitoring instructions: Patient is instructed to check her blood sugars 1 times a day.  Call [facility] if: BG persistently < 70  . I reviewed the Rule of 15 for the treatment of hypoglycemia in detail with the patient. Literature supplied.    2) Diabetic complications:   Eye: Does not have known diabetic retinopathy.   Neuro/ Feet: Does not have known diabetic peripheral neuropathy .   Renal: Patient does not have known baseline CKD. She   is  on an ACEI/ARB at present.     F/U in 6 months   Signed electronically by: Mack Guise, MD  [facility]  [facility] [address] [place], [place] [address] Phone: [phone] FAX: [phone]   CC: Debbrah Alar, [address] [place] [address] Phone: [phone]  Fax: [phone]  Return to Endocrinology clinic as below: Future Appointments  Date Time Provider [place]  05/22/2020  9:30 AM Shamleffer, Melanie Crazier, MD [facility]  06/15/2020  9:40 AM Debbrah Alar, NP [facility]

## 2020-05-31 ENCOUNTER — Other Ambulatory Visit: Payer: Self-pay | Admitting: Family

## 2020-06-15 ENCOUNTER — Encounter: Payer: Self-pay | Admitting: Family

## 2020-06-15 ENCOUNTER — Other Ambulatory Visit: Payer: Self-pay

## 2020-06-15 ENCOUNTER — Ambulatory Visit (INDEPENDENT_AMBULATORY_CARE_PROVIDER_SITE_OTHER): Payer: Medicare Other | Admitting: Family

## 2020-06-15 ENCOUNTER — Ambulatory Visit (HOSPITAL_BASED_OUTPATIENT_CLINIC_OR_DEPARTMENT_OTHER)
Admission: RE | Admit: 2020-06-15 | Discharge: 2020-06-15 | Disposition: A | Discharge status: Home / Self Care | Payer: Medicare Other | Source: Ambulatory Visit | Attending: Family | Admitting: Family

## 2020-06-15 VITALS — BP 124/65 | HR 86 | Temp 98.3°F | Resp 16 | Ht 62.0 in | Wt 117.4 lb

## 2020-06-15 DIAGNOSIS — M7989 Other specified soft tissue disorders: Secondary | ICD-10-CM | POA: Diagnosis not present

## 2020-06-15 DIAGNOSIS — M79671 Pain in right foot: Secondary | ICD-10-CM | POA: Diagnosis not present

## 2020-06-15 DIAGNOSIS — S9031XA Contusion of right foot, initial encounter: Secondary | ICD-10-CM | POA: Diagnosis not present

## 2020-06-15 NOTE — Progress Notes (Signed)
Subjective:    Patient ID: Molly May, female    DOB: [birth year], 60 y.o.   MRN: [id number]  HPI  Patient is a 60 yr old female who presents today with chief complaint of right sided foot swelling. Tripped over a box on Sunday.  Now having increased swelling and pain. Worried that she might have broken something.   A video sign language interpreter is used for today's visit via [employer]: Hassan Rowan #[id number]   Review of Systems See HPI  Past Medical History:  Diagnosis Date  . Allergy   . Anemia    IDA - on iron   . Anxiety   . Anxiety disorder 02/19/2011  . Arthritis    hands   . Cataract    removed both eyes   . Clotting disorder (HCC)    past hx of clot in the ueterus 16-17 yrs old   . 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 Depression   . Diabetes mellitus   . GERD (gastroesophageal reflux disease)   . Heart murmur   . Hyperlipidemia   . Hypertension   . Iron deficiency   . Kidney stone   . Migraine   . Mitral valve prolapse 02/19/2011  . Stomach ulcer 42yr of age     Social History   Socioeconomic History  . Marital status: Single    Spouse name: Not on file  . Number of children: Not on file  . Years of education: Not on file  . Highest education level: Not on file  Occupational History  . Not on file  Tobacco Use  . Smoking status: Never Smoker  . Smokeless tobacco: Never Used  Vaping Use  . Vaping Use: Never used  Substance and Sexual Activity  . Alcohol use: Yes    Comment: Occ  . Drug use: No  . Sexual activity: Not on file  Other Topics Concern  . Not on file  Social History Narrative   Regular exercise:  Yes. 3-5 x weekly   Caffeine Use:  No   Married- lives with husband, 2 children (age 8428and 880.   Disabled- due to deafness and overall work. Couldn't keep up.  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         Social Determinants of Health   Financial Resource  Strain: Not on file  Food Insecurity: Not on file  Transportation Needs: Not on file  Physical Activity: Not on file  Stress: Not on file  Social Connections: Not on file  Intimate Partner Violence: Not on file    Past Surgical History:  Procedure Laterality Date  . ABDOMINAL HYSTERECTOMY  2000  . APPENDECTOMY  1986  . BREAST BIOPSY    . BREAST SURGERY  2011   biposy--benign  . CARDIAC CATHETERIZATION    . CATARACT EXTRACTION Right 10/13/11  . CATARACT EXTRACTION Left 03/2014  . CHOLECYSTECTOMY  2011  . COLONOSCOPY    . OVARIAN CYST REMOVAL  1986, 1988  . POLYPECTOMY    . TONSILLECTOMY AND ADENOIDECTOMY  1973  . UPPER GASTROINTESTINAL ENDOSCOPY  2009   Gets them every 3 years    Family History  Problem Relation Age of Onset  . Kidney disease Mother        kidney failure  . Multiple sclerosis Mother   . Other Mother        polio  . Hyperlipidemia Father   .  Heart disease Father   . Hypertension Father   . Stomach cancer Father   . Colon cancer Father   . Diabetes Maternal Aunt   . Heart disease Maternal Grandmother   . Hypertension Maternal Grandmother   . Diabetes Maternal Grandmother   . Colon cancer Maternal Grandmother   . Heart disease Maternal Grandfather   . Hypertension Maternal Grandfather   . Pancreatic cancer Neg Hx   . Esophageal cancer Neg Hx   . Colon polyps Neg Hx   . Rectal cancer Neg Hx     Allergies  Allergen Reactions  . Aspirin Other (See Comments)    Stomach pains  . Codeine Other (See Comments)    Nosebleed  . Eggs Or Egg-Derived Products     Pt reports allergy to RAW EGGS. ? Stomach upset, diarrhea  . Erythromycin Nausea And Vomiting  . Fluticasone Propionate     Stomach upset  . Penicillins Hives  . Prilosec [Omeprazole Magnesium]     migraine  . Vaseline [Petrolatum] Itching  . Vicodin [Hydrocodone-Acetaminophen]     twitching  . Zetia [Ezetimibe] Diarrhea    Current Outpatient Medications on File Prior to Visit   Medication Sig Dispense Refill  . blood glucose meter kit and supplies KIT Dispense based on patient and insurance preference. Use up to four times daily as directed. (FOR ICD-9 250.00, 250.01). 1 each 0  . Cholecalciferol (VITAMIN D) 50 MCG (2000 UT) tablet     . estradiol (ESTRACE) 1 MG tablet Take 1 tablet (1 mg total) by mouth daily. 90 tablet 3  . famotidine (PEPCID) 20 MG tablet Take 20 mg by mouth at bedtime.    . insulin glargine (LANTUS SOLOSTAR) 100 UNIT/ML Solostar Pen Inject 6 Units into the skin daily. 15 mL 11  . Insulin Pen Needle (BD PEN NEEDLE NANO U/F) 32G X 4 MM MISC Use 4 times daily as directed 375 each 3  . Iron Combinations (IRON COMPLEX PO) Take 75 mg by mouth daily.    Marland Kitchen lisinopril (ZESTRIL) 2.5 MG tablet Take 1 tablet by mouth once daily 90 tablet 1  . LIVALO 4 MG TABS Take 1 tablet by mouth once daily 90 tablet 0  . ONETOUCH ULTRA test strip USE 1 STRIP TO CHECK GLUCOSE UP TO 4 TIMES DAILY AS DIRECTED 100 each 1  . oxybutynin (DITROPAN-XL) 5 MG 24 hr tablet TAKE 1 TABLET BY MOUTH ONCE DAILY AT BEDTIME 90 tablet 1  . pioglitazone (ACTOS) 30 MG tablet Take 1 tablet (30 mg total) by mouth daily. 90 tablet 3  . SitaGLIPtin-MetFORMIN HCl (JANUMET XR) 50-500 MG TB24 Take 2 tablets by mouth daily. 180 tablet 1   No current facility-administered medications on file prior to visit.    BP 124/65 (BP Location: Right Arm, Patient Position: Sitting, Cuff Size: Small)   Pulse 86   Temp 98.3 F (36.8 C) (Oral)   Resp 16   Ht 5' 2" (1.575 m)   Wt 117 lb 6.4 oz (53.3 kg)   LMP 04/28/1998   SpO2 100%   BMI 21.47 kg/m       Objective:   Physical Exam Constitutional:      Appearance: Normal appearance.  Pulmonary:     Effort: Pulmonary effort is normal.  Musculoskeletal:     Comments: + tenderness and pain at the base of the right great toe  Neurological:     Mental Status: She is alert.  Assessment & Plan:  Foot pain- will obtain x-ray to rule out  fracture. Further recommendations following review of x-ray results.   This visit occurred during the SARS-CoV-2 public health emergency.  Safety protocols were in place, including screening questions prior to the visit, additional usage of staff PPE, and extensive cleaning of exam room while observing appropriate contact time as indicated for disinfecting solutions.

## 2020-06-15 NOTE — Patient Instructions (Signed)
Please complete x-ray on the first floor in imaging.

## 2020-06-18 ENCOUNTER — Telehealth: Payer: Self-pay | Admitting: Family

## 2020-06-18 ENCOUNTER — Telehealth: Payer: Self-pay

## 2020-06-18 DIAGNOSIS — S92414A Nondisplaced fracture of proximal phalanx of right great toe, initial encounter for closed fracture: Secondary | ICD-10-CM

## 2020-06-18 NOTE — Telephone Encounter (Signed)
Pt sent a portal message inquiring about xray results.

## 2020-06-18 NOTE — Telephone Encounter (Signed)
See mychart.  

## 2020-06-22 ENCOUNTER — Ambulatory Visit: Payer: Medicare Other | Admitting: Family

## 2020-07-13 ENCOUNTER — Other Ambulatory Visit: Payer: Self-pay | Admitting: Family

## 2020-07-27 ENCOUNTER — Other Ambulatory Visit: Payer: Self-pay | Admitting: Family

## 2020-08-30 ENCOUNTER — Other Ambulatory Visit: Payer: Self-pay | Admitting: Family

## 2020-09-18 DIAGNOSIS — H52203 Unspecified astigmatism, bilateral: Secondary | ICD-10-CM | POA: Diagnosis not present

## 2020-09-18 DIAGNOSIS — H26491 Other secondary cataract, right eye: Secondary | ICD-10-CM | POA: Diagnosis not present

## 2020-09-18 DIAGNOSIS — Z961 Presence of intraocular lens: Secondary | ICD-10-CM | POA: Diagnosis not present

## 2020-09-18 DIAGNOSIS — H5213 Myopia, bilateral: Secondary | ICD-10-CM | POA: Diagnosis not present

## 2020-09-18 DIAGNOSIS — Z7984 Long term (current) use of oral hypoglycemic drugs: Secondary | ICD-10-CM | POA: Diagnosis not present

## 2020-09-18 DIAGNOSIS — E119 Type 2 diabetes mellitus without complications: Secondary | ICD-10-CM | POA: Diagnosis not present

## 2020-09-18 DIAGNOSIS — H524 Presbyopia: Secondary | ICD-10-CM | POA: Diagnosis not present

## 2020-09-18 LAB — HM DIABETES EYE EXAM

## 2020-09-26 ENCOUNTER — Telehealth: Payer: Self-pay | Admitting: Family

## 2020-09-26 ENCOUNTER — Other Ambulatory Visit: Payer: Self-pay

## 2020-09-26 ENCOUNTER — Ambulatory Visit (INDEPENDENT_AMBULATORY_CARE_PROVIDER_SITE_OTHER): Payer: Medicare Other | Admitting: Family

## 2020-09-26 VITALS — BP 127/52 | HR 77 | Temp 98.2°F | Resp 16 | Ht 62.0 in | Wt 116.8 lb

## 2020-09-26 DIAGNOSIS — N951 Menopausal and female climacteric states: Secondary | ICD-10-CM | POA: Diagnosis not present

## 2020-09-26 DIAGNOSIS — N3281 Overactive bladder: Secondary | ICD-10-CM | POA: Diagnosis not present

## 2020-09-26 DIAGNOSIS — E119 Type 2 diabetes mellitus without complications: Secondary | ICD-10-CM | POA: Diagnosis not present

## 2020-09-26 DIAGNOSIS — E785 Hyperlipidemia, unspecified: Secondary | ICD-10-CM | POA: Diagnosis not present

## 2020-09-26 LAB — LIPID PANEL
Cholesterol: 146 mg/dL (ref 0–200)
HDL: 69.6 mg/dL (ref >39.00)
LDL Cholesterol: 60 mg/dL (ref 0–99)
NonHDL: 76.63
Total CHOL/HDL Ratio: 2
Triglycerides: 85 mg/dL (ref 0.0–149.0)
VLDL: 17 mg/dL (ref 0.0–40.0)

## 2020-09-26 LAB — COMPREHENSIVE METABOLIC PANEL
ALT: 8 U/L (ref 0–35)
AST: 13 U/L (ref 0–37)
Albumin: 4.4 g/dL (ref 3.5–5.2)
Alkaline Phosphatase: 49 U/L (ref 39–117)
BUN: 15 mg/dL (ref 6–23)
CO2: 30 mEq/L (ref 19–32)
Calcium: 9.3 mg/dL (ref 8.4–10.5)
Chloride: 99 mEq/L (ref 96–112)
Creatinine, Ser: 0.78 mg/dL (ref 0.40–1.20)
GFR: 84.27 mL/min (ref >60.00)
Glucose, Bld: 147 mg/dL — ABNORMAL HIGH (ref 70–99)
Potassium: 3.9 mEq/L (ref 3.5–5.1)
Sodium: 136 mEq/L (ref 135–145)
Total Bilirubin: 0.5 mg/dL (ref 0.2–1.2)
Total Protein: 7 g/dL (ref 6.0–8.3)

## 2020-09-26 LAB — HEMOGLOBIN A1C: Hgb A1c MFr Bld: 7.1 % — ABNORMAL HIGH (ref 4.6–6.5)

## 2020-09-26 NOTE — Assessment & Plan Note (Signed)
Being well treated with estrace 1mg  PO daily. Continue same.

## 2020-09-26 NOTE — Assessment & Plan Note (Signed)
Stable, continue ditropan xl 5mg  daily.

## 2020-09-26 NOTE — Patient Instructions (Signed)
Please complete lab work prior to leaving.   

## 2020-09-26 NOTE — Assessment & Plan Note (Signed)
Clinically stable.  Obtain follow up A1C, continue actos 30mg  and janumet xr 50-500.

## 2020-09-26 NOTE — Progress Notes (Signed)
Subjective:   By signing my name below, I, Shehryar Baig, attest that this documentation has been prepared under the direction and in the presence of Debbrah Alar NP. 09/26/2020     Patient ID: Molly May, female    DOB: [birth year], 60 y.o.   MRN: [id number]  No chief complaint on file.   HPI Patient is in today for a office visit.  She is doing well at this time.  Diabetes- She is managing her blood sugars well at this time. Diet- She has stopped eating corn entirely. Otherwise her diet is normal. Bladder- She is taking 5 mg oxybutynin daily PO to manage her overactive bladder. Estrogen- She continues taking 1 mg estrace daily PO to manage her symptoms. Immunization- She is not UTD on shingles vaccine. She has 2 Covid-19 vaccines at this time. She is willing to get the vaccines on Friday, 09/28/2020.  Vision- She is UTD on vision care. Her ophthalmologist is Dr. Rex Kras.   She plans to move to [facility] in a few weeks to be closer to family.  Past Medical History:  Diagnosis Date  . Allergy   . Anemia    IDA - on iron   . Anxiety   . Anxiety disorder 02/19/2011  . Arthritis    hands   . Cataract    removed both eyes   . Clotting disorder (HCC)    past hx of clot in the ueterus 16-17 yrs old   . 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 Depression   . Diabetes mellitus   . GERD (gastroesophageal reflux disease)   . Heart murmur   . Hyperlipidemia   . Hypertension   . Iron deficiency   . Kidney stone   . Migraine   . Mitral valve prolapse 02/19/2011  . Stomach ulcer 70yr of age    Past Surgical History:  Procedure Laterality Date  . ABDOMINAL HYSTERECTOMY  2000  . APPENDECTOMY  1986  . BREAST BIOPSY    . BREAST SURGERY  2011   biposy--benign  . CARDIAC CATHETERIZATION    . CATARACT EXTRACTION Right 10/13/11  . CATARACT EXTRACTION Left 03/2014  . CHOLECYSTECTOMY  2011  . COLONOSCOPY    . OVARIAN CYST REMOVAL  1986, 1988  . POLYPECTOMY    .  TONSILLECTOMY AND ADENOIDECTOMY  1973  . UPPER GASTROINTESTINAL ENDOSCOPY  2009   Gets them every 3 years    Family History  Problem Relation Age of Onset  . Kidney disease Mother        kidney failure  . Multiple sclerosis Mother   . Other Mother        polio  . Hyperlipidemia Father   . Heart disease Father   . Hypertension Father   . Stomach cancer Father   . Colon cancer Father   . Diabetes Maternal Aunt   . Heart disease Maternal Grandmother   . Hypertension Maternal Grandmother   . Diabetes Maternal Grandmother   . Colon cancer Maternal Grandmother   . Heart disease Maternal Grandfather   . Hypertension Maternal Grandfather   . Pancreatic cancer Neg Hx   . Esophageal cancer Neg Hx   . Colon polyps Neg Hx   . Rectal cancer Neg Hx     Social History   Socioeconomic History  . Marital status: Single    Spouse name: Not on file  . Number of children: Not on file  . Years of education: Not on file  . Highest education level: Not  on file  Occupational History  . Not on file  Tobacco Use  . Smoking status: Never Smoker  . Smokeless tobacco: Never Used  Vaping Use  . Vaping Use: Never used  Substance and Sexual Activity  . Alcohol use: Yes    Comment: Occ  . Drug use: No  . Sexual activity: Not on file  Other Topics Concern  . Not on file  Social History Narrative   Regular exercise:  Yes. 3-5 x weekly   Caffeine Use:  No   Married- lives with husband, 2 children (age 64 and 82).   Disabled- due to deafness and overall work. Couldn't keep up.  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         Social Determinants of Health   Financial Resource Strain: Not on file  Food Insecurity: Not on file  Transportation Needs: Not on file  Physical Activity: Not on file  Stress: Not on file  Social Connections: Not on file  Intimate Partner Violence: Not on file    Outpatient Medications  Prior to Visit  Medication Sig Dispense Refill  . lisinopril (ZESTRIL) 2.5 MG tablet Take 1 tablet (2.5 mg total) by mouth daily. 90 tablet 1  . blood glucose meter kit and supplies KIT Dispense based on patient and insurance preference. Use up to four times daily as directed. (FOR ICD-9 250.00, 250.01). 1 each 0  . Cholecalciferol (VITAMIN D) 50 MCG (2000 UT) tablet     . estradiol (ESTRACE) 1 MG tablet Take 1 tablet (1 mg total) by mouth daily. 90 tablet 3  . famotidine (PEPCID) 20 MG tablet Take 20 mg by mouth at bedtime.    . insulin glargine (LANTUS SOLOSTAR) 100 UNIT/ML Solostar Pen Inject 6 Units into the skin daily. 15 mL 11  . Insulin Pen Needle (BD PEN NEEDLE NANO U/F) 32G X 4 MM MISC Use 4 times daily as directed 375 each 3  . Iron Combinations (IRON COMPLEX PO) Take 75 mg by mouth daily.    Marland Kitchen LIVALO 4 MG TABS Take 1 tablet by mouth once daily 90 tablet 0  . ONETOUCH ULTRA test strip USE 1 STRIP TO CHECK GLUCOSE UP TO 4 TIMES DAILY AS DIRECTED 100 each 1  . oxybutynin (DITROPAN-XL) 5 MG 24 hr tablet TAKE 1 TABLET BY MOUTH ONCE DAILY AT BEDTIME 90 tablet 0  . pioglitazone (ACTOS) 30 MG tablet Take 1 tablet (30 mg total) by mouth daily. 90 tablet 3  . SitaGLIPtin-MetFORMIN HCl (JANUMET XR) 50-500 MG TB24 Take 2 tablets by mouth daily. 180 tablet 1   No facility-administered medications prior to visit.    Allergies  Allergen Reactions  . Aspirin Other (See Comments)    Stomach pains  . Codeine Other (See Comments)    Nosebleed  . Eggs Or Egg-Derived Products     Pt reports allergy to RAW EGGS. ? Stomach upset, diarrhea  . Erythromycin Nausea And Vomiting  . Fluticasone Propionate     Stomach upset  . Penicillins Hives  . Prilosec [Omeprazole Magnesium]     migraine  . Vaseline [Petrolatum] Itching  . Vicodin [Hydrocodone-Acetaminophen]     twitching  . Zetia [Ezetimibe] Diarrhea    ROS     Objective:    Physical Exam Constitutional:      General: She is not in  acute distress.    Appearance: Normal appearance. She is not  ill-appearing.  HENT:     Head: Normocephalic and atraumatic.     Right Ear: External ear normal.     Left Ear: External ear normal.  Eyes:     Extraocular Movements: Extraocular movements intact.     Pupils: Pupils are equal, round, and reactive to light.  Cardiovascular:     Rate and Rhythm: Normal rate and regular rhythm.     Pulses: Normal pulses.     Heart sounds: Normal heart sounds. No murmur heard. No gallop.   Pulmonary:     Effort: Pulmonary effort is normal. No respiratory distress.     Breath sounds: Normal breath sounds. No wheezing, rhonchi or rales.  Skin:    General: Skin is warm and dry.  Neurological:     Mental Status: She is alert and oriented to person, place, and time.  Psychiatric:        Behavior: Behavior normal.     LMP 04/28/1998  Wt Readings from Last 3 Encounters:  06/15/20 117 lb 6.4 oz (53.3 kg)  05/22/20 118 lb 8 oz (53.8 kg)  05/01/20 121 lb (54.9 kg)    Diabetic Foot Exam - Simple   No data filed    Lab Results  Component Value Date   WBC 6.7 06/09/2019   HGB 12.7 06/09/2019   HCT 38.4 06/09/2019   PLT 227.0 06/09/2019   GLUCOSE 249 (H) 09/20/2019   CHOL 164 09/20/2019   TRIG 104.0 09/20/2019   HDL 72.00 09/20/2019   LDLCALC 71 09/20/2019   ALT 14 06/09/2019   AST 15 06/09/2019   NA 134 (L) 09/20/2019   K 3.8 09/20/2019   CL 98 09/20/2019   CREATININE 0.78 09/20/2019   BUN 16 09/20/2019   CO2 30 09/20/2019   TSH 1.50 09/14/2017   HGBA1C 6.5 (A) 05/22/2020   MICROALBUR <0.7 05/22/2020    Lab Results  Component Value Date   TSH 1.50 09/14/2017   Lab Results  Component Value Date   WBC 6.7 06/09/2019   HGB 12.7 06/09/2019   HCT 38.4 06/09/2019   MCV 86.9 06/09/2019   PLT 227.0 06/09/2019   Lab Results  Component Value Date   NA 134 (L) 09/20/2019   K 3.8 09/20/2019   CO2 30 09/20/2019   GLUCOSE 249 (H) 09/20/2019   BUN 16 09/20/2019   CREATININE  0.78 09/20/2019   BILITOT 0.4 06/09/2019   ALKPHOS 51 06/09/2019   AST 15 06/09/2019   ALT 14 06/09/2019   PROT 7.6 06/09/2019   ALBUMIN 4.3 06/09/2019   CALCIUM 9.4 09/20/2019   GFR 76.27 09/20/2019   Lab Results  Component Value Date   CHOL 164 09/20/2019   Lab Results  Component Value Date   HDL 72.00 09/20/2019   Lab Results  Component Value Date   LDLCALC 71 09/20/2019   Lab Results  Component Value Date   TRIG 104.0 09/20/2019   Lab Results  Component Value Date   CHOLHDL 2 09/20/2019   Lab Results  Component Value Date   HGBA1C 6.5 (A) 05/22/2020       Assessment & Plan:   Problem List Items Addressed This Visit   None      No orders of the defined types were placed in this encounter.   I, Debbrah Alar NP, personally preformed the services described in this documentation.  All medical record entries made by the scribe were at my direction and in my presence.  I have reviewed the chart and discharge  instructions (if applicable) and agree that the record reflects my personal performance and is accurate and complete. 09/26/2020   I,Shehryar Baig,acting as a Education administrator for Nance Pear, NP.,have documented all relevant documentation on the behalf of Nance Pear, NP,as directed by  Nance Pear, NP while in the presence of Nance Pear, NP.   Shehryar [employer]

## 2020-09-28 ENCOUNTER — Ambulatory Visit: Payer: Medicare Other | Attending: Internal Medicine

## 2020-09-28 DIAGNOSIS — Z23 Encounter for immunization: Secondary | ICD-10-CM

## 2020-09-28 NOTE — Progress Notes (Signed)
   Covid-19 Vaccination Clinic  Name:  Molly May    MRN: [id number] DOB: March 17, 1961  09/28/2020  [name] was observed post Covid-19 immunization for 15 minutes without incident. She was provided with Vaccine Information Sheet and instruction to access the V-Safe system.   [name] was instructed to call 911 with any severe reactions post vaccine: Marland Kitchen Difficulty breathing  . Swelling of face and throat  . A fast heartbeat  . A bad rash all over body  . Dizziness and weakness   Immunizations Administered    Name Date Dose VIS Date Route   Moderna Covid-19 Booster Vaccine 09/28/2020 11:01 AM 0.25 mL 02/15/2020 Intramuscular   Manufacturer: Moderna   Lot: [id number]   NDC: 46568-127-51

## 2020-09-28 NOTE — Telephone Encounter (Signed)
Opened in error

## 2020-10-02 ENCOUNTER — Other Ambulatory Visit (HOSPITAL_BASED_OUTPATIENT_CLINIC_OR_DEPARTMENT_OTHER): Payer: Self-pay

## 2020-10-23 ENCOUNTER — Ambulatory Visit: Payer: Medicare Other | Admitting: Family

## 2020-11-02 ENCOUNTER — Other Ambulatory Visit: Payer: Self-pay | Admitting: Family

## 2020-12-15 ENCOUNTER — Other Ambulatory Visit: Payer: Self-pay | Admitting: Family
